# Patient Record
Sex: Male | Born: 1945 | Race: White | Hispanic: No | Marital: Married | Smoking: Former smoker
Health system: Southern US, Community
[De-identification: ages and names within clinical notes are randomized; demographics above are authoritative.]

## PROBLEM LIST (undated history)

## (undated) DIAGNOSIS — N529 Male erectile dysfunction, unspecified: Secondary | ICD-10-CM

## (undated) DIAGNOSIS — M1990 Unspecified osteoarthritis, unspecified site: Secondary | ICD-10-CM

## (undated) DIAGNOSIS — C439 Malignant melanoma of skin, unspecified: Secondary | ICD-10-CM

## (undated) DIAGNOSIS — R011 Cardiac murmur, unspecified: Secondary | ICD-10-CM

## (undated) DIAGNOSIS — I10 Essential (primary) hypertension: Secondary | ICD-10-CM

## (undated) DIAGNOSIS — J449 Chronic obstructive pulmonary disease, unspecified: Secondary | ICD-10-CM

## (undated) DIAGNOSIS — F419 Anxiety disorder, unspecified: Secondary | ICD-10-CM

## (undated) DIAGNOSIS — E119 Type 2 diabetes mellitus without complications: Secondary | ICD-10-CM

## (undated) DIAGNOSIS — E785 Hyperlipidemia, unspecified: Secondary | ICD-10-CM

## (undated) DIAGNOSIS — G4730 Sleep apnea, unspecified: Secondary | ICD-10-CM

## (undated) DIAGNOSIS — I509 Heart failure, unspecified: Secondary | ICD-10-CM

## (undated) HISTORY — DX: Essential (primary) hypertension: I10

## (undated) HISTORY — DX: Type 2 diabetes mellitus without complications: E11.9

## (undated) HISTORY — DX: Sleep apnea, unspecified: G47.30

## (undated) HISTORY — DX: Malignant melanoma of skin, unspecified: C43.9

## (undated) HISTORY — DX: Anxiety disorder, unspecified: F41.9

## (undated) HISTORY — PX: EYE SURGERY: SHX253

## (undated) HISTORY — DX: Chronic obstructive pulmonary disease, unspecified: J44.9

## (undated) HISTORY — DX: Male erectile dysfunction, unspecified: N52.9

## (undated) HISTORY — DX: Unspecified osteoarthritis, unspecified site: M19.90

## (undated) HISTORY — DX: Cardiac murmur, unspecified: R01.1

## (undated) HISTORY — DX: Hyperlipidemia, unspecified: E78.5

---
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
## 1971-05-08 HISTORY — PX: KNEE SURGERY: SHX244

## 2004-02-25 ENCOUNTER — Emergency Department (HOSPITAL_COMMUNITY): Admission: EM | Admit: 2004-02-25 | Discharge: 2004-02-25 | Payer: Self-pay | Admitting: Emergency Medicine

## 2005-07-15 ENCOUNTER — Emergency Department: Payer: Self-pay | Admitting: Emergency Medicine

## 2008-04-02 ENCOUNTER — Ambulatory Visit: Payer: Self-pay | Admitting: Otolaryngology

## 2008-04-08 ENCOUNTER — Ambulatory Visit: Payer: Self-pay | Admitting: Otolaryngology

## 2008-07-15 ENCOUNTER — Ambulatory Visit: Payer: Self-pay | Admitting: Cardiology

## 2009-09-30 ENCOUNTER — Emergency Department: Payer: Self-pay | Admitting: Internal Medicine

## 2010-03-01 ENCOUNTER — Emergency Department: Payer: Self-pay | Admitting: Emergency Medicine

## 2010-09-22 ENCOUNTER — Ambulatory Visit: Payer: Self-pay | Admitting: Specialist

## 2010-10-25 ENCOUNTER — Ambulatory Visit: Payer: Self-pay | Admitting: Specialist

## 2012-04-17 ENCOUNTER — Emergency Department: Payer: Self-pay | Admitting: Internal Medicine

## 2012-04-17 LAB — COMPREHENSIVE METABOLIC PANEL
Albumin: 4.1 g/dL (ref 3.4–5.0)
Alkaline Phosphatase: 113 U/L (ref 50–136)
Anion Gap: 6 — ABNORMAL LOW (ref 7–16)
BUN: 15 mg/dL (ref 7–18)
Bilirubin,Total: 0.5 mg/dL (ref 0.2–1.0)
Calcium, Total: 9 mg/dL (ref 8.5–10.1)
Chloride: 105 mmol/L (ref 98–107)
Co2: 28 mmol/L (ref 21–32)
Creatinine: 0.77 mg/dL (ref 0.60–1.30)
EGFR (African American): >60
EGFR (Non-African Amer.): >60
Glucose: 126 mg/dL — ABNORMAL HIGH (ref 65–99)
Osmolality: 280 (ref 275–301)
Potassium: 3.8 mmol/L (ref 3.5–5.1)
SGOT(AST): 25 U/L (ref 15–37)
SGPT (ALT): 46 U/L (ref 12–78)
Sodium: 139 mmol/L (ref 136–145)
Total Protein: 7.8 g/dL (ref 6.4–8.2)

## 2012-04-17 LAB — URINALYSIS, COMPLETE
Bacteria: NONE SEEN
Bilirubin,UR: NEGATIVE
Glucose,UR: NEGATIVE mg/dL (ref 0–75)
Ketone: NEGATIVE
Leukocyte Esterase: NEGATIVE
Ph: 6 (ref 4.5–8.0)
RBC,UR: 2 /HPF (ref 0–5)

## 2012-04-17 LAB — CBC
HCT: 38.7 % — ABNORMAL LOW (ref 40.0–52.0)
HGB: 13.4 g/dL (ref 13.0–18.0)
MCHC: 34.7 g/dL (ref 32.0–36.0)
MCV: 86 fL (ref 80–100)
Platelet: 205 10*3/uL (ref 150–440)
RDW: 14 % (ref 11.5–14.5)
WBC: 6.6 10*3/uL (ref 3.8–10.6)

## 2014-10-12 ENCOUNTER — Encounter: Payer: Federal, State, Local not specified - PPO | Attending: Family Medicine | Admitting: Dietician

## 2014-10-12 ENCOUNTER — Encounter: Payer: Self-pay | Admitting: Dietician

## 2014-10-12 VITALS — BP 145/84 | Ht 74.0 in | Wt 253.6 lb

## 2014-10-12 DIAGNOSIS — E119 Type 2 diabetes mellitus without complications: Secondary | ICD-10-CM | POA: Diagnosis present

## 2014-10-12 DIAGNOSIS — E1165 Type 2 diabetes mellitus with hyperglycemia: Secondary | ICD-10-CM

## 2014-10-12 NOTE — Progress Notes (Signed)
Diabetes Self-Management Education  Visit Type: First/Initial  Appt. Start Time: 0915 Appt. End Time: 1030  10/12/2014  [name], identified by name and date of birth, is a 69 y.o. male with a diagnosis of Diabetes: Type 2.  Other people present during visit:      ASSESSMENT  Blood pressure 145/84, height 6\' 2"  (1.88 m), weight 253 lb 9.6 oz (115.032 kg). Body mass index is 32.55 kg/(m^2).  Initial Visit Information:  Are you currently following a meal plan?: Yes   Are you taking your medications as prescribed?: No Are you checking your feet?: No   How often do you need to have someone help you when you read instructions, pamphlets, or other written materials from your doctor or pharmacy?: 1 - Never    Psychosocial:     Patient Belief/Attitude about Diabetes: Motivated to manage diabetes Self-care barriers: None Patient Concerns: Nutrition/Meal planning, Glycemic Control, Weight Control Special Needs: None Preferred Learning Style: Auditory, Hands on Learning Readiness: Ready  Complications:   Last HgB A1C per patient/outside source: 9 mg/dL (09-10-14) How often do you check your blood sugar?: 1-2 times/day Fasting Blood glucose range (mg/dL): >200 Postprandial Blood glucose range (mg/dL): >200 Have you had a dilated eye exam in the past 12 months?: Yes Have you had a dental exam in the past 12 months?: Yes  Diet Intake:  Snack (morning):  (eats am and afternoon snacks) Lunch:  (eats fried foods and sweets 4-5x/wk) Snack (evening):  (does not eat a bedtime snack) Beverage(s):  (drinks fruit juices &/or Healthy Balance drinks 5+/day)    Exercise:  Exercise: ADL's  Individualized Plan for Diabetes Self-Management Training:   Learning Objective:  Patient will have a greater understanding of diabetes self-management.  Patient education plan per assessed needs and concerns is to attend individual sessions for      Education Topics Reviewed with Patient Today:  Definition of diabetes, type 1 and 2, and the diagnosis of diabetes, Explored patient's options for treatment of their diabetes Role of diet in the treatment of diabetes and the relationship between the three main macronutrients and blood glucose level (portion sizes, basic carbohydrate counting) Role of exercise on diabetes management, blood pressure control and cardiac health. Reviewed patients medication for diabetes, action, purpose, timing of dose and side effects. Taught/discussed recording of test results and interpretation of SMBG., Identified appropriate SMBG and/or A1C goals., Purpose and frequency of SMBG., Yearly dilated eye exam Discussed and identified patients' treatment of hyperglycemia. Relationship between chronic complications and blood glucose control, Lipid levels, blood glucose control and heart disease, Dental care, Retinopathy and reason for yearly dilated eye exams     Lifestyle issues that need to be addressed for better diabetes care, Helped patient develop diabetes management plan for (enter comment)  PATIENTS GOALS/Plan (Developed by the patient):     Plan:   Patient Instructions   Check blood sugars 2 x day before breakfast and 2 hrs after supper every day  Exercise:  HOLD exercise for now-exercise ONLY when blood sugars <250   Avoid sugar sweetened drinks (soda, tea, coffee, sports drinks, juices) Eat 3 meals day,   1 snacks a day at bedtime Space meals 4-6 hours apart Drink lots of water and sugar free liquids Limit fried foods and sweets/desserts  Bring blood sugar records to the next appointment/class  Get a Journalist, newspaper  with BG's on 10-14-14 Return for appointment/classes on:  Class 1 on 11-15-14   Expected Outcomes:     Education material provided: general meal planning guidelines  If problems or questions, patient to contact team via:  Call [place] with [facility] on 10-14-14  [phone])  Future DSME appointment: 4-6 wks (class 1 on 11-15-14)

## 2014-10-12 NOTE — Patient Instructions (Signed)
  Check blood sugars 2 x day before breakfast and 2 hrs after supper every day  Exercise:  HOLD exercise for now-exercise ONLY when blood sugars <250   Avoid sugar sweetened drinks (soda, tea, coffee, sports drinks, juices) Eat 3 meals day,   1 snacks a day at bedtime Space meals 4-6 hours apart Drink lots of water and sugar free liquids Limit fried foods and sweets/desserts  Bring blood sugar records to the next appointment/class  Get a Journalist, newspaper with BG[facility] on 10-14-14 Return for appointment/classes on:  Class 1 on 11-15-14

## 2014-11-02 ENCOUNTER — Telehealth: Payer: Self-pay | Admitting: Dietician

## 2014-11-15 ENCOUNTER — Encounter: Payer: Self-pay | Admitting: Dietician

## 2014-11-15 ENCOUNTER — Encounter: Payer: Federal, State, Local not specified - PPO | Attending: Family Medicine | Admitting: Dietician

## 2014-11-15 VITALS — Wt 255.6 lb

## 2014-11-15 DIAGNOSIS — E119 Type 2 diabetes mellitus without complications: Secondary | ICD-10-CM | POA: Insufficient documentation

## 2014-11-15 NOTE — Progress Notes (Deleted)
Subjective:     Patient ID: Paul Goodman, male   DOB: [birth year], 69 y.o.   MRN: [id number]  HPI   Review of Systems     Objective:   Physical Exam     Assessment:     ***    Plan:     ***

## 2014-11-15 NOTE — Telephone Encounter (Signed)
Phone call to pt for update on [facility].

## 2014-11-15 NOTE — Progress Notes (Deleted)
Subjective:     Patient ID: Paul Goodman, male   DOB: Mar 09, 1946, 69 y.o.   MRN: [id number]  HPI   Review of Systems     Objective:   Physical Exam     Assessment:     ***    Plan:     ***

## 2014-11-15 NOTE — Progress Notes (Signed)

## 2014-11-16 ENCOUNTER — Encounter: Payer: Self-pay | Admitting: Dietician

## 2014-11-16 NOTE — Progress Notes (Signed)
Faxed Dr. Kary Kos  [facility] on 11-15-14

## 2014-11-22 ENCOUNTER — Encounter: Payer: Federal, State, Local not specified - PPO | Admitting: Dietician

## 2014-11-22 ENCOUNTER — Encounter: Payer: Self-pay | Admitting: Dietician

## 2014-11-22 VITALS — Wt 252.5 lb

## 2014-11-22 DIAGNOSIS — E119 Type 2 diabetes mellitus without complications: Secondary | ICD-10-CM | POA: Diagnosis not present

## 2014-11-22 NOTE — Progress Notes (Signed)

## 2014-11-29 ENCOUNTER — Encounter: Payer: Federal, State, Local not specified - PPO | Admitting: Dietician

## 2014-11-29 VITALS — BP 150/74 | Ht 74.0 in | Wt 254.5 lb

## 2014-11-29 DIAGNOSIS — E119 Type 2 diabetes mellitus without complications: Secondary | ICD-10-CM | POA: Diagnosis not present

## 2014-11-29 DIAGNOSIS — E1165 Type 2 diabetes mellitus with hyperglycemia: Secondary | ICD-10-CM

## 2014-11-29 NOTE — Progress Notes (Signed)

## 2014-11-30 ENCOUNTER — Encounter: Payer: Self-pay | Admitting: Dietician

## 2015-05-30 ENCOUNTER — Encounter: Payer: Self-pay | Admitting: Dietician

## 2015-05-30 NOTE — Progress Notes (Signed)
Mailed 6 month follow up letter to pt. 

## 2015-07-05 ENCOUNTER — Encounter: Payer: Self-pay | Admitting: Dietician

## 2015-07-05 NOTE — Progress Notes (Signed)
Pt did not respond to 6 month follow up letter that was mailed on 05-30-15 

## 2016-01-19 ENCOUNTER — Telehealth: Payer: Self-pay | Admitting: Urology

## 2016-01-19 ENCOUNTER — Ambulatory Visit (INDEPENDENT_AMBULATORY_CARE_PROVIDER_SITE_OTHER): Payer: Federal, State, Local not specified - PPO | Admitting: Urology

## 2016-01-19 ENCOUNTER — Encounter: Payer: Self-pay | Admitting: Urology

## 2016-01-19 VITALS — Ht 74.0 in | Wt 260.3 lb

## 2016-01-19 DIAGNOSIS — N521 Erectile dysfunction due to diseases classified elsewhere: Secondary | ICD-10-CM

## 2016-01-19 DIAGNOSIS — Z125 Encounter for screening for malignant neoplasm of prostate: Secondary | ICD-10-CM

## 2016-01-19 DIAGNOSIS — N138 Other obstructive and reflux uropathy: Secondary | ICD-10-CM

## 2016-01-19 DIAGNOSIS — N401 Enlarged prostate with lower urinary tract symptoms: Secondary | ICD-10-CM | POA: Diagnosis not present

## 2016-01-19 DIAGNOSIS — E1169 Type 2 diabetes mellitus with other specified complication: Secondary | ICD-10-CM

## 2016-01-19 NOTE — Telephone Encounter (Signed)
Would you call a script for Trimix to [place] for this patient?

## 2016-01-19 NOTE — Progress Notes (Signed)
01/19/2016 12:09 PM   Paul Goodman 12-09-1945 [id number]  Referring provider: [name], MD [address] [facility] [name], [name] [address]  Chief Complaint  Patient presents with  . Erectile Dysfunction    referred by Dr. Mosie Epstein    HPI: Patient is a 70 year old Caucasian male who is referred for erectile dysfunction by Dr. Kary Kos.  Erectile dysfunction His SHIM score is 5,  which is severe.   He has been having difficulty with erections for 35 years.   His major complaint is no erections.  His libido is preserved.   His risk factors for ED are age, BPH, DM, HTN, HLD, sleep apnea,  anxiety, alcohol abuse, history of smoking, antidepressants, pain medication and blood pressure medications.   He denies any painful erections or curvatures with his erections.   He has tried Viagra in the past, but he could not tolerate the side effects.  He then tried Cialis, but it was ineffective.  He has upcoming wedding anniversary in one month.        SHIM    Row Name 01/19/16 1145         SHIM: Over the last 6 months:   How do you rate your confidence that you could get and keep an erection? Very Low     When you had erections with sexual stimulation, how often were your erections hard enough for penetration (entering your partner)? Almost Never or Never     During sexual intercourse, how often were you able to maintain your erection after you had penetrated (entered) your partner? Extremely Difficult     During sexual intercourse, how difficult was it to maintain your erection to completion of intercourse? Extremely Difficult     When you attempted sexual intercourse, how often was it satisfactory for you? Extremely Difficult       SHIM Total Score   SHIM 5        Score: 1-7 Severe ED 8-11 Moderate ED 12-16 Mild-Moderate ED 17-21 Mild ED 22-25 No ED   BPH WITH LUTS His IPSS score today is 3, which is mild lower urinary tract symptomatology.  He is  pleased with his quality life due to his urinary symptoms.   His major complaint today is straining to urinate and nocturia. He has had these symptoms for several years.  He denies any dysuria, hematuria or suprapubic pain.  He also denies any recent fevers, chills, nausea or vomiting.   He does not have a family history of PCa.      IPSS    Row Name 01/19/16 1100         International Prostate Symptom Score   How often have you had the sensation of not emptying your bladder? Not at All     How often have you had to urinate less than every two hours? Not at All     How often have you found you stopped and started again several times when you urinated? Not at All     How often have you found it difficult to postpone urination? Not at All     How often have you had a weak urinary stream? Not at All     How often have you had to strain to start urination? Less than half the time     How many times did you typically get up at night to urinate? 1 Time     Total IPSS Score 3  Quality of Life due to urinary symptoms   If you were to spend the rest of your life with your urinary condition just the way it is now how would you feel about that? Pleased        Score:  1-7 Mild 8-19 Moderate 20-35 Severe     PMH: Past Medical History:  Diagnosis Date  . Anxiety   . Arthritis   . COPD (chronic obstructive pulmonary disease) ([place])   . Diabetes ([place])   . ED (erectile dysfunction)   . Heart murmur   . Hyperlipidemia   . Hypertension   . Melanoma ([place])   . Sleep apnea     Surgical History: Past Surgical History:  Procedure Laterality Date  . EYE SURGERY Right    cancer  . HAND SURGERY Left 1966  . KNEE SURGERY Left 1973  . TONSILLECTOMY  1964    Home Medications:    Medication List       Accurate as of 01/19/16 12:09 PM. Always use your most recent med list.          albuterol 108 (90 Base) MCG/ACT inhaler Commonly known as:  PROVENTIL HFA;VENTOLIN HFA INHALE 2  PUFFS EVERY 4 - 6 HOURS AS NEEDED   amLODipine 5 MG tablet Commonly known as:  NORVASC 5 mg daily.   aspirin EC 81 MG tablet Take 81 mg by mouth daily.   BD PEN NEEDLE NANO U/F 32G X 4 MM Misc Generic drug:  Insulin Pen Needle USE AS DIRECTED   Chromium Picolinate 500 MCG Tabs Take 1 tablet by mouth daily.   Cinnamon 500 MG capsule Take by mouth.   fenofibrate 160 MG tablet 160 mg daily.   FREESTYLE LITE test strip Generic drug:  glucose blood USE AS DIRECTED TWICE DAILY   GARCINIA CAMBOGIA-CHROMIUM PO Take 1,000 mg by mouth daily.   glipiZIDE 5 MG 24 hr tablet Commonly known as:  GLUCOTROL XL Take 5 mg by mouth daily. 2 tablets daily   L-Lysine 500 MG Tabs Take 1 tablet by mouth daily.   losartan-hydrochlorothiazide 100-25 MG tablet Commonly known as:  HYZAAR Take 1 tablet by mouth daily.   Magnesium 500 MG Tabs Take 1 tablet by mouth daily.   Milk Thistle 250 MG Caps Take 1 capsule by mouth daily.   niacin 500 MG tablet Take 500 mg by mouth daily.   PARoxetine 20 MG tablet Commonly known as:  PAXIL Take 1 tablet by mouth daily.   PARoxetine 20 MG tablet Commonly known as:  PAXIL Take by mouth.   rOPINIRole 0.5 MG tablet Commonly known as:  REQUIP 1 tablet daily.   Saw Palmetto 450 MG Caps Take 1 capsule by mouth daily.   traMADol 50 MG tablet Commonly known as:  ULTRAM Take 1-2 tablets by mouth. Every 6 hrs as needed for pain   VANADIUM PO Take 500 mcg by mouth daily.   VICTOZA 18 MG/3ML Sopn Generic drug:  Liraglutide Inject into the skin.   vitamin A 8000 UNIT capsule Take 8,000 Units by mouth daily. Takes 2 tablets daily   vitamin B-12 1000 MCG tablet Commonly known as:  CYANOCOBALAMIN Take 1,000 mcg by mouth daily.   Zinc 50 MG Tabs Take 1 tablet by mouth daily.       Allergies: No Known Allergies  Family History: Family History  Problem Relation Age of Onset  . Kidney Stones Father   . Kidney disease Neg Hx   .  Prostate cancer Neg  Hx     Social History:  reports that he has quit smoking. He smoked 0.00 packs per day. He has quit using smokeless tobacco. He reports that he drinks about 1.8 oz of alcohol per week . He reports that he does not use drugs.  ROS: UROLOGY Frequent Urination?: No Hard to postpone urination?: No Burning/pain with urination?: No Get up at night to urinate?: Yes Leakage of urine?: No Urine stream starts and stops?: No Trouble starting stream?: No Do you have to strain to urinate?: No Blood in urine?: No Urinary tract infection?: No Sexually transmitted disease?: No Injury to kidneys or bladder?: No Painful intercourse?: No Weak stream?: No Erection problems?: Yes Penile pain?: No  Gastrointestinal Nausea?: No Vomiting?: No Indigestion/heartburn?: No Diarrhea?: No Constipation?: No  Constitutional Fever: No Night sweats?: No Weight loss?: No Fatigue?: Yes  Skin Skin rash/lesions?: No Itching?: No  Eyes Blurred vision?: No Double vision?: No  Ears/Nose/Throat Sore throat?: No Sinus problems?: Yes  Hematologic/Lymphatic Swollen glands?: No Easy bruising?: No  Cardiovascular Leg swelling?: No Chest pain?: No  Respiratory Cough?: No Shortness of breath?: Yes  Endocrine Excessive thirst?: No  Musculoskeletal Back pain?: Yes Joint pain?: Yes  Neurological Headaches?: No Dizziness?: No  Psychologic Depression?: No Anxiety?: Yes  Physical Exam: Ht 6\' 2"  (1.88 m)   Wt 260 lb 4.8 oz (118.1 kg)   BMI 33.42 kg/m   Constitutional: Well nourished. Alert and oriented, No acute distress. HEENT: [place] AT, moist mucus membranes. Trachea midline, no masses. Cardiovascular: No clubbing, cyanosis, or edema. Respiratory: Normal respiratory effort, no increased work of breathing. GI: Abdomen is soft, non tender, non distended, no abdominal masses. Liver and spleen not palpable.  No hernias appreciated.  Stool sample for occult testing is not  indicated.   GU: No CVA tenderness.  No bladder fullness or masses.  Patient with uncircumcised phallus. Foreskin easily retracted.   Urethral meatus is patent.  No penile discharge. No penile lesions or rashes. Scrotum without lesions, cysts, rashes and/or edema.  Testicles are located scrotally bilaterally. No masses are appreciated in the testicles. Left and right epididymis are normal. Rectal: Patient with  normal sphincter tone. Anus and perineum without scarring or rashes. No rectal masses are appreciated. Prostate is approximately 60 grams, DRE limited to patient's body habitus, no nodules are appreciated. Seminal vesicles are normal. Skin: No rashes, bruises or suspicious lesions. Lymph: No cervical or inguinal adenopathy. Neurologic: Grossly intact, no focal deficits, moving all 4 extremities. Psychiatric: Normal mood and affect.  Laboratory Data: Lab Results  Component Value Date   WBC 6.6 04/17/2012   HGB 13.4 04/17/2012   HCT 38.7 (L) 04/17/2012   MCV 86 04/17/2012   PLT 205 04/17/2012    Lab Results  Component Value Date   CREATININE 0.77 04/17/2012     Lab Results  Component Value Date   AST 25 04/17/2012   Lab Results  Component Value Date   ALT 46 04/17/2012     Assessment & Plan:    1. Erectile dysfunction:  SHIM score is 5.   I explained to the patient that in order to achieve an erection it takes good functioning of the nervous system (parasympathetic, sympathetic, sensory and motor), good blood flow into the erectile tissue of the penis and a desire to have sex.   I stated that conditions like diabetes, hypertension, coronary artery disease, peripheral vascular disease, smoking, alcohol consumption, age and BPH can diminish the ability to have an erection.  We trying a different PDE5 inhibitor, intra-urethral suppositories, intracavernous vasoactive drug injection therapy, vacuum constriction device and penile prosthesis implantation.  We discussed trying a  different PDE5 inhibitor, intra-urethral suppositories, intracavernous vasoactive drug injection therapy, vacuum constriction device and penile prosthesis implantation.  - encouraged patient to keep good control of DM  - encouraged patient to undergo sleep study as he has a history of sleep apnea  - interested in Trimix  - RTC for Trimix injection  - check TSH  2. BPH with LUTS  - IPSS score is 3/1  - Continue conservative management, avoiding bladder irritants and timed voiding's  - Cannot tolerate medication or medication failure, schedule cystoscopy  - RTC in 12 months for IPSS, PSA and exam    3. PSA screening  AUA panel feels that in men age 75 to 66 years, there was sufficient certainty that the benefits of screening could outweigh the harms that a recommendation of shared decision-making in this age group was justified  - we discussed screening, he states that he believes they have always been low  - will check PSA today   Return for RTC for Trimix injection.  These notes generated with voice recognition software. I apologize for typographical errors.  Zara Council, [facility] [address], [place], [place] [address] [phone]

## 2016-01-19 NOTE — Telephone Encounter (Signed)
Script called in to pharmacy  

## 2016-01-20 ENCOUNTER — Telehealth: Payer: Self-pay | Admitting: Family Medicine

## 2016-01-20 LAB — PSA: PROSTATE SPECIFIC AG, SERUM: 2.1 ng/mL (ref 0.0–4.0)

## 2016-01-20 LAB — TSH: TSH: 0.764 u[IU]/mL (ref 0.450–4.500)

## 2016-01-20 NOTE — Telephone Encounter (Signed)
Patient notified and voiced understanding.

## 2016-01-20 NOTE — Telephone Encounter (Signed)
-----   Message from Nori Riis, PA-C sent at 01/20/2016  8:48 AM EDT ----- Please notify the patient that his thyroid function and PSA are normal.

## 2016-02-20 ENCOUNTER — Ambulatory Visit: Payer: Federal, State, Local not specified - PPO | Admitting: Urology

## 2016-02-20 ENCOUNTER — Encounter: Payer: Self-pay | Admitting: Urology

## 2016-02-20 VITALS — BP 194/88 | HR 71 | Ht 74.0 in | Wt 262.6 lb

## 2016-02-20 DIAGNOSIS — R351 Nocturia: Secondary | ICD-10-CM

## 2016-02-20 DIAGNOSIS — E1169 Type 2 diabetes mellitus with other specified complication: Secondary | ICD-10-CM | POA: Diagnosis not present

## 2016-02-20 DIAGNOSIS — N521 Erectile dysfunction due to diseases classified elsewhere: Secondary | ICD-10-CM

## 2016-02-20 NOTE — Progress Notes (Signed)
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  An area near the base of the penis is cleansed with rubbing alcohol.  Careful to avoid the dorsal vein, 6 mcg of Trimix is injected at a 90 degree angle into the left corpus cavernosum near the base of the penis.  Patient experienced a semi firm erection in 15 minutes.    Patient will go home and report results.

## 2016-02-21 ENCOUNTER — Telehealth: Payer: Self-pay | Admitting: Urology

## 2016-02-21 NOTE — Telephone Encounter (Signed)
Patient will need to have another appointment to inject a higher dose of the Trimix.

## 2016-02-21 NOTE — Telephone Encounter (Signed)
Patient said he did not have good results with the trimix injection. He said there was some improvement but not what he was hoping for.  He said he was not what he was 30 years ago.  He would like a call back  [place]

## 2016-02-22 NOTE — Telephone Encounter (Signed)
done

## 2016-02-29 ENCOUNTER — Ambulatory Visit: Payer: Federal, State, Local not specified - PPO | Admitting: Urology

## 2016-02-29 ENCOUNTER — Telehealth: Payer: Self-pay | Admitting: Urology

## 2016-02-29 ENCOUNTER — Encounter: Payer: Self-pay | Admitting: Urology

## 2016-02-29 VITALS — BP 168/83 | HR 83 | Ht 74.0 in | Wt 259.9 lb

## 2016-02-29 DIAGNOSIS — I10 Essential (primary) hypertension: Secondary | ICD-10-CM | POA: Insufficient documentation

## 2016-02-29 DIAGNOSIS — N529 Male erectile dysfunction, unspecified: Secondary | ICD-10-CM | POA: Diagnosis not present

## 2016-02-29 DIAGNOSIS — M1990 Unspecified osteoarthritis, unspecified site: Secondary | ICD-10-CM | POA: Insufficient documentation

## 2016-02-29 DIAGNOSIS — J449 Chronic obstructive pulmonary disease, unspecified: Secondary | ICD-10-CM | POA: Insufficient documentation

## 2016-02-29 DIAGNOSIS — E785 Hyperlipidemia, unspecified: Secondary | ICD-10-CM | POA: Insufficient documentation

## 2016-02-29 DIAGNOSIS — E119 Type 2 diabetes mellitus without complications: Secondary | ICD-10-CM | POA: Insufficient documentation

## 2016-02-29 NOTE — Telephone Encounter (Signed)
Would you call in a new dose of Timix for [facility]  (30 mg PAPA, 1 mL phen, 50 mg prostaglandin) for the patient?  This is an increase in his dose.

## 2016-02-29 NOTE — Progress Notes (Signed)
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  An area near the base of the penis is cleansed with rubbing alcohol.  Careful to avoid the dorsal vein, 8 mcg of Trimix is injected at a 90 degree angle into the left corpus cavernosum near the base of the penis.  Patient experienced a semi firm erection in 15 minutes.    Patient will go home and report results.    He will continue to inject the remaining syringes of Trimix (3 syringes) every other day.  We will increase the ingredients of the Trimix for the patient and he will present for an injection for the increased Trimix.

## 2016-03-02 NOTE — Telephone Encounter (Signed)
Medication sent to pharmacy  

## 2016-03-22 ENCOUNTER — Telehealth: Payer: Self-pay | Admitting: Urology

## 2016-03-22 NOTE — Telephone Encounter (Signed)
Patient called regarding his Trimix prescription.  He would like to know how much of the needle he should inject.  He cant remember the instructions.  Please call his number and it is okay to leave on his answering machine.

## 2016-03-22 NOTE — Telephone Encounter (Signed)
Patient needs to inject 8 micrograms and plunge the needle until the hub.

## 2016-03-22 NOTE — Telephone Encounter (Signed)
Pt called back and I read Shannon's message to him.  Pt voiced understanding.

## 2016-04-23 DIAGNOSIS — E1165 Type 2 diabetes mellitus with hyperglycemia: Secondary | ICD-10-CM | POA: Insufficient documentation

## 2017-01-09 NOTE — Progress Notes (Signed)
01/11/2017 9:19 AM   Paul Goodman July 19, 1945 [id number]  Referring provider: [name], MD [address] [facility] [place], [place] [address]  Chief Complaint  Patient presents with  . Nephrolithiasis    patient having pain thinks it may be a stone    HPI: Patient is a 71 yo WM with ED and BPH with LU TS who presents today with a new complaint of possible kidney stones.  He states that he started to have right flank pain that radiated to the right groin area.  Pain has been at an 11/10.  He states moving seems to make the pain worse.  He states taking olive oil helps the pain.  He has not had any gross hematuria, dysuria or suprapubic pain. He has not had any fevers, chills, nausea or vomiting.  He has not passed any fragments.  He was told a long time ago that he had several stones in each kidneys after he had an x-ray.  He states he does not remember if he ever had any surgery or procedures for his stones.   BPH WITH LUTS  (prostate and/or bladder) His IPSS score today is 16, which is moderate lower urinary tract symptomatology.  He is terrible with his quality life due to his urinary symptoms.     His major complaint today is nocturia x 3.  He has had these symptoms for several years.  He denies any dysuria, hematuria or suprapubic pain.   He also denies any recent fevers, chills, nausea or vomiting.  He does not have a family history of PCa.      IPSS    Row Name 01/11/17 0800         International Prostate Symptom Score   How often have you had the sensation of not emptying your bladder? Less than half the time     How often have you had to urinate less than every two hours? More than half the time     How often have you found you stopped and started again several times when you urinated? Less than half the time     How often have you found it difficult to postpone urination? About half the time     How often have you had a weak urinary stream?  Less than half the time     How often have you had to strain to start urination? Not at All     How many times did you typically get up at night to urinate? 3 Times     Total IPSS Score 16       Quality of Life due to urinary symptoms   If you were to spend the rest of your life with your urinary condition just the way it is now how would you feel about that? Terrible        Score:  1-7 Mild 8-19 Moderate 20-35 Severe   Erectile dysfunction His SHIM score is 5, which is severe ED.   His previous SHIM score was 5.  He has been having difficulty with erections for several.   His major complaint is no erections.  His libido is preserved.   His risk factors for ED are age, BPH, DM, COPD, HTN and sleep apnea.  He denies any painful erections or curvatures with his erections.   He is still having/no longer having spontaneous erections.  He has tried Trimix in the past.  He states he has used his friends  Trimix from another pharmacy and it worked better.   He is seeing Dr. Kary Kos for testosterone injections.       [place] Name 01/11/17 0849         SHIM: Over the last 6 months:   How do you rate your confidence that you could get and keep an erection? Very Low     When you had erections with sexual stimulation, how often were your erections hard enough for penetration (entering your partner)? Almost Never or Never     During sexual intercourse, how often were you able to maintain your erection after you had penetrated (entered) your partner? Almost Never or Never     During sexual intercourse, how difficult was it to maintain your erection to completion of intercourse? Extremely Difficult     When you attempted sexual intercourse, how often was it satisfactory for you? Almost Never or Never       SHIM Total Score   SHIM 5        Score: 1-7 Severe ED 8-11 Moderate ED 12-16 Mild-Moderate ED 17-21 Mild ED 22-25 No ED   PMH: Past Medical History:  Diagnosis Date  . Anxiety     . Arthritis   . COPD (chronic obstructive pulmonary disease) ([place])   . Diabetes ([place])   . ED (erectile dysfunction)   . Heart murmur   . Hyperlipidemia   . Hypertension   . Melanoma ([place])   . Sleep apnea     Surgical History: Past Surgical History:  Procedure Laterality Date  . EYE SURGERY Right    cancer  . HAND SURGERY Left 1966  . KNEE SURGERY Left 1973  . TONSILLECTOMY  1964    Home Medications:  Allergies as of 01/11/2017   No Known Allergies     Medication List       Accurate as of 01/11/17  9:19 AM. Always use your most recent med list.          albuterol 108 (90 Base) MCG/ACT inhaler Commonly known as:  PROVENTIL HFA;VENTOLIN HFA INHALE 2 PUFFS EVERY 4 - 6 HOURS AS NEEDED   amLODipine 5 MG tablet Commonly known as:  NORVASC 5 mg daily.   aspirin EC 81 MG tablet Take 81 mg by mouth daily.   BD PEN NEEDLE NANO U/F 32G X 4 MM Misc Generic drug:  Insulin Pen Needle USE AS DIRECTED   Chromium Picolinate 500 MCG Tabs Take 1 tablet by mouth daily.   Cinnamon 500 MG capsule Take by mouth.   fenofibrate 160 MG tablet 160 mg daily.   FREESTYLE LITE test strip Generic drug:  glucose blood USE AS DIRECTED TWICE DAILY   GARCINIA CAMBOGIA-CHROMIUM PO Take 1,000 mg by mouth daily.   glipiZIDE 5 MG 24 hr tablet Commonly known as:  GLUCOTROL XL Take 5 mg by mouth daily. 2 tablets daily   L-Lysine 500 MG Tabs Take 1 tablet by mouth daily.   losartan-hydrochlorothiazide 100-25 MG tablet Commonly known as:  HYZAAR Take 1 tablet by mouth daily.   Magnesium 500 MG Tabs Take 1 tablet by mouth daily.   Milk Thistle 250 MG Caps Take 1 capsule by mouth daily.   niacin 500 MG tablet Take 500 mg by mouth daily.   PARoxetine 20 MG tablet Commonly known as:  PAXIL Take 1 tablet by mouth daily.   PARoxetine 20 MG tablet Commonly known as:  PAXIL Take by mouth.   rOPINIRole 0.5 MG tablet  Commonly known as:  REQUIP 1 tablet daily.   Saw Palmetto  450 MG Caps Take 1 capsule by mouth daily.   tiotropium 18 MCG inhalation capsule Commonly known as:  [place] into inhaler and inhale.   traMADol 50 MG tablet Commonly known as:  ULTRAM Take 1-2 tablets by mouth. Every 6 hrs as needed for pain   VANADIUM PO Take 500 mcg by mouth daily.   VICTOZA 18 MG/3ML Sopn Generic drug:  liraglutide Inject into the skin.   vitamin A 8000 UNIT capsule Take 8,000 Units by mouth daily. Takes 2 tablets daily   vitamin B-12 1000 MCG tablet Commonly known as:  CYANOCOBALAMIN Take 1,000 mcg by mouth daily.   Zinc 50 MG Tabs Take 1 tablet by mouth daily.            Discharge Care Instructions        Start     Ordered   01/11/17 0000  PSA     01/11/17 0850   01/11/17 0000  CT RENAL STONE STUDY    Question Answer Comment  Preferred imaging location? [facility]   Radiology Contrast Protocol - do NOT remove file path [email or web address]      01/11/17 0919      Allergies: No Known Allergies  Family History: Family History  Problem Relation Age of Onset  . Kidney Stones Father   . Kidney disease Neg Hx   . Prostate cancer Neg Hx   . Kidney cancer Neg Hx   . Bladder Cancer Neg Hx     Social History:  reports that he has quit smoking. He smoked 0.00 packs per day. He has quit using smokeless tobacco. He reports that he drinks about 1.8 oz of alcohol per week . He reports that he does not use drugs.  ROS: UROLOGY Frequent Urination?: No Hard to postpone urination?: No Burning/pain with urination?: No Get up at night to urinate?: Yes Leakage of urine?: No Urine stream starts and stops?: No Trouble starting stream?: No Do you have to strain to urinate?: No Blood in urine?: No Urinary tract infection?: No Sexually transmitted disease?: No Injury to kidneys or bladder?: No Painful intercourse?: No Weak stream?: No Erection problems?: No Penile pain?: No  Gastrointestinal Nausea?:  No Vomiting?: No Indigestion/heartburn?: No Diarrhea?: No Constipation?: No  Constitutional Fever: No Night sweats?: No Weight loss?: No Fatigue?: No  Skin Skin rash/lesions?: No Itching?: No  Eyes Blurred vision?: No Double vision?: No  Ears/Nose/Throat Sore throat?: No Sinus problems?: No  Hematologic/Lymphatic Swollen glands?: No Easy bruising?: No  Cardiovascular Leg swelling?: No Chest pain?: No  Respiratory Cough?: No Shortness of breath?: Yes  Endocrine Excessive thirst?: No  Musculoskeletal Back pain?: No Joint pain?: No  Neurological Headaches?: No Dizziness?: No  Psychologic Depression?: No Anxiety?: No  Physical Exam: BP (!) 181/79   Pulse 61   Ht 6' 2.25" (1.886 m)   Wt 250 lb (113.4 kg)   BMI 31.88 kg/m   Constitutional: Well nourished. Alert and oriented, No acute distress. HEENT: [place] AT, moist mucus membranes. Trachea midline, no masses. Cardiovascular: No clubbing, cyanosis, or edema. Respiratory: Normal respiratory effort, no increased work of breathing. GI: Abdomen is soft, non tender, non distended, no abdominal masses. Liver and spleen not palpable.  No hernias appreciated.  Stool sample for occult testing is not indicated.   GU: No CVA tenderness.  No bladder fullness or masses.  Patient with uncircumcised phallus.   Foreskin easily retracted.  Urethral  meatus is patent.  No penile discharge. No penile lesions or rashes. Scrotum without lesions, cysts, rashes and/or edema.  Testicles are located scrotally bilaterally. No masses are appreciated in the testicles. Left and right epididymis are normal. Rectal: Patient with  normal sphincter tone. Anus and perineum without scarring or rashes. No rectal masses are appreciated. Prostate is approximately 50  grams, no nodules are appreciated. Seminal vesicles are normal. Skin: No rashes, bruises or suspicious lesions. Lymph: No cervical or inguinal adenopathy. Neurologic: Grossly  intact, no focal deficits, moving all 4 extremities. Psychiatric: Normal mood and affect.  Laboratory Data: PSA History  2.1 ng/mL on 01/19/2016   Lab Results  Component Value Date   TSH 0.764 01/19/2016    Urinalysis Unremarkable.  See EPIC.   I have reviewed the labs.    Assessment & Plan:    1. Renal colic  - patient reports a history of nephrolithiasis  - will obtain a CT Renal Stone study  - RTC for report  - Advised to contact our office or seek treatment in the ED if becomes febrile or pain/ vomiting are difficult control in order to arrange for emergent/urgent intervention  2. BPH with LUTS  - IPSS score is 16/6, it is worsening  - Continue conservative management, avoiding bladder irritants and timed voiding's  - most bothersome symptoms is/are nocturia  - RTC in 12 months for IPSS, PSA, PVR and exam   3. Erectile dysfunction  - SHIM score is 5  - didn't find [facility] Trimix effective, but he found his friend's more effective - he does not know where his friend received the medication or what the medication was  - RTC in 12 months for repeat SHIM score and exam     Return for CT Renal Stone Study.  These notes generated with voice recognition software. I apologize for typographical errors.  Zara Council, [facility] [address], [place], [place] [address] [phone]

## 2017-01-10 ENCOUNTER — Other Ambulatory Visit: Payer: Self-pay | Admitting: *Deleted

## 2017-01-10 DIAGNOSIS — N200 Calculus of kidney: Secondary | ICD-10-CM

## 2017-01-11 ENCOUNTER — Ambulatory Visit (INDEPENDENT_AMBULATORY_CARE_PROVIDER_SITE_OTHER): Payer: Federal, State, Local not specified - PPO | Admitting: Urology

## 2017-01-11 ENCOUNTER — Other Ambulatory Visit
Admission: RE | Admit: 2017-01-11 | Discharge: 2017-01-11 | Disposition: A | Discharge status: Home / Self Care | Payer: Federal, State, Local not specified - PPO | Source: Ambulatory Visit | Attending: Urology | Admitting: Urology

## 2017-01-11 ENCOUNTER — Encounter: Payer: Self-pay | Admitting: Urology

## 2017-01-11 VITALS — BP 181/79 | HR 61 | Ht 74.25 in | Wt 250.0 lb

## 2017-01-11 DIAGNOSIS — N138 Other obstructive and reflux uropathy: Secondary | ICD-10-CM

## 2017-01-11 DIAGNOSIS — N401 Enlarged prostate with lower urinary tract symptoms: Secondary | ICD-10-CM

## 2017-01-11 DIAGNOSIS — N529 Male erectile dysfunction, unspecified: Secondary | ICD-10-CM

## 2017-01-11 DIAGNOSIS — R109 Unspecified abdominal pain: Secondary | ICD-10-CM

## 2017-01-11 DIAGNOSIS — N200 Calculus of kidney: Secondary | ICD-10-CM | POA: Insufficient documentation

## 2017-01-11 LAB — CBC WITH DIFFERENTIAL/PLATELET
Basophils Absolute: 0 10*3/uL (ref 0–0.1)
Basophils Relative: 1 %
EOS PCT: 2 %
Eosinophils Absolute: 0.2 10*3/uL (ref 0–0.7)
HEMATOCRIT: 40.9 % (ref 40.0–52.0)
Hemoglobin: 14.3 g/dL (ref 13.0–18.0)
LYMPHS ABS: 2.2 10*3/uL (ref 1.0–3.6)
LYMPHS PCT: 33 %
MCH: 30.3 pg (ref 26.0–34.0)
MCHC: 34.8 g/dL (ref 32.0–36.0)
MCV: 87 fL (ref 80.0–100.0)
MONO ABS: 0.5 10*3/uL (ref 0.2–1.0)
Monocytes Relative: 8 %
NEUTROS ABS: 3.6 10*3/uL (ref 1.4–6.5)
Neutrophils Relative %: 56 %
PLATELETS: 172 10*3/uL (ref 150–440)
RBC: 4.7 MIL/uL (ref 4.40–5.90)
RDW: 13.8 % (ref 11.5–14.5)
WBC: 6.5 10*3/uL (ref 3.8–10.6)

## 2017-01-11 LAB — URINALYSIS, COMPLETE (UACMP) WITH MICROSCOPIC
BACTERIA UA: NONE SEEN
Bilirubin Urine: NEGATIVE
GLUCOSE, UA: 500 mg/dL — AB
Hgb urine dipstick: NEGATIVE
KETONES UR: NEGATIVE mg/dL
Leukocytes, UA: NEGATIVE
Nitrite: NEGATIVE
PROTEIN: NEGATIVE mg/dL
Specific Gravity, Urine: >1.03 — ABNORMAL HIGH (ref 1.005–1.030)
Squamous Epithelial / LPF: NONE SEEN
pH: 5.5 (ref 5.0–8.0)

## 2017-01-11 LAB — BASIC METABOLIC PANEL
Anion gap: 7 (ref 5–15)
BUN: 15 mg/dL (ref 6–20)
CALCIUM: 9 mg/dL (ref 8.9–10.3)
CO2: 28 mmol/L (ref 22–32)
Chloride: 100 mmol/L — ABNORMAL LOW (ref 101–111)
Creatinine, Ser: 0.74 mg/dL (ref 0.61–1.24)
Glucose, Bld: 230 mg/dL — ABNORMAL HIGH (ref 65–99)
POTASSIUM: 4.4 mmol/L (ref 3.5–5.1)
SODIUM: 135 mmol/L (ref 135–145)

## 2017-01-11 LAB — PSA: Prostatic Specific Antigen: 0.88 ng/mL (ref 0.00–4.00)

## 2017-01-12 LAB — URINE CULTURE: Culture: NO GROWTH

## 2017-01-14 DIAGNOSIS — F419 Anxiety disorder, unspecified: Secondary | ICD-10-CM | POA: Insufficient documentation

## 2017-01-14 DIAGNOSIS — N138 Other obstructive and reflux uropathy: Secondary | ICD-10-CM | POA: Insufficient documentation

## 2017-01-14 DIAGNOSIS — N529 Male erectile dysfunction, unspecified: Secondary | ICD-10-CM | POA: Insufficient documentation

## 2017-01-14 DIAGNOSIS — G4733 Obstructive sleep apnea (adult) (pediatric): Secondary | ICD-10-CM | POA: Insufficient documentation

## 2017-01-14 DIAGNOSIS — G2581 Restless legs syndrome: Secondary | ICD-10-CM | POA: Insufficient documentation

## 2017-01-25 ENCOUNTER — Ambulatory Visit
Admission: RE | Admit: 2017-01-25 | Discharge: 2017-01-25 | Disposition: A | Discharge status: Home / Self Care | Payer: Federal, State, Local not specified - PPO | Source: Ambulatory Visit | Attending: Urology | Admitting: Urology

## 2017-01-25 DIAGNOSIS — K760 Fatty (change of) liver, not elsewhere classified: Secondary | ICD-10-CM | POA: Diagnosis not present

## 2017-01-25 DIAGNOSIS — R109 Unspecified abdominal pain: Secondary | ICD-10-CM | POA: Insufficient documentation

## 2017-01-25 DIAGNOSIS — I700 Atherosclerosis of aorta: Secondary | ICD-10-CM | POA: Insufficient documentation

## 2017-01-25 DIAGNOSIS — K5730 Diverticulosis of large intestine without perforation or abscess without bleeding: Secondary | ICD-10-CM | POA: Insufficient documentation

## 2017-01-31 NOTE — Progress Notes (Signed)
02/01/2017 8:38 AM   Paul Goodman 18-May-1945 [id number]  Referring provider: [name], MD [address] [facility] [place], [place] [address]  Chief Complaint  Patient presents with  . Results    CT    HPI: Patient is a 71 yo WM with ED and BPH with LU TS who presents today to discuss his CT Renal stone study.    When patient presented 2 weeks ago, he was complaining of right flank pain that radiated to the right groin area.  Pain has been at an 11/10.  He states moving seems to make the pain worse.  He states taking olive oil helps the pain.  He has not had any gross hematuria, dysuria or suprapubic pain. He has not had any fevers, chills, nausea or vomiting.  He has not passed any fragments.  He was told a long time ago that he had several stones in each kidneys after he had an x-ray.  He states he does not remember if he ever had any surgery or procedures for his stones.   CT Renal stone study performed on 01/25/2017 noted No evidence of urolithiasis, hydronephrosis, or other acute findings.  Colonic diverticulosis, without radiographic evidence of diverticulitis.  Mild hepatic steatosis.  Aortic atherosclerosis  Today, he complains of frequent urination, getting up at night to urinate and intermittency.  He is not having dysuria, gross hematuria or suprapubic pain.  He is not having fevers, chills, nausea or vomiting.  He is still complaining of right flank pain, but it is only twinges at this time.    PMH: Past Medical History:  Diagnosis Date  . Anxiety   . Arthritis   . COPD (chronic obstructive pulmonary disease) ([place])   . Diabetes ([place])   . ED (erectile dysfunction)   . Heart murmur   . Hyperlipidemia   . Hypertension   . Melanoma ([place])   . Sleep apnea     Surgical History: Past Surgical History:  Procedure Laterality Date  . EYE SURGERY Right    cancer  . HAND SURGERY Left 1966  . KNEE SURGERY Left 1973  . TONSILLECTOMY  1964    Home  Medications:  Allergies as of 02/01/2017   No Known Allergies     Medication List       Accurate as of 02/01/17  8:38 AM. Always use your most recent med list.          albuterol 108 (90 Base) MCG/ACT inhaler Commonly known as:  PROVENTIL HFA;VENTOLIN HFA INHALE 2 PUFFS EVERY 4 - 6 HOURS AS NEEDED   amLODipine 5 MG tablet Commonly known as:  NORVASC 5 mg daily.   aspirin EC 81 MG tablet Take 81 mg by mouth daily.   BD PEN NEEDLE NANO U/F 32G X 4 MM Misc Generic drug:  Insulin Pen Needle USE AS DIRECTED   Chromium Picolinate 500 MCG Tabs Take 1 tablet by mouth daily.   Cinnamon 500 MG capsule Take by mouth.   fenofibrate 160 MG tablet 160 mg daily.   FREESTYLE LITE test strip Generic drug:  glucose blood USE AS DIRECTED TWICE DAILY   GARCINIA CAMBOGIA-CHROMIUM PO Take 1,000 mg by mouth daily.   glipiZIDE 5 MG 24 hr tablet Commonly known as:  GLUCOTROL XL Take 5 mg by mouth daily. 2 tablets daily   L-Lysine 500 MG Tabs Take 1 tablet by mouth daily.   losartan-hydrochlorothiazide 100-25 MG tablet Commonly known as:  HYZAAR Take 1  tablet by mouth daily.   Magnesium 500 MG Tabs Take 1 tablet by mouth daily.   Milk Thistle 250 MG Caps Take 1 capsule by mouth daily.   niacin 500 MG tablet Take 500 mg by mouth daily.   PARoxetine 20 MG tablet Commonly known as:  PAXIL Take 1 tablet by mouth daily.   PARoxetine 20 MG tablet Commonly known as:  PAXIL Take by mouth.   rOPINIRole 0.5 MG tablet Commonly known as:  REQUIP 1 tablet daily.   Saw Palmetto 450 MG Caps Take 1 capsule by mouth daily.   tiotropium 18 MCG inhalation capsule Commonly known as:  [place] into inhaler and inhale.   traMADol 50 MG tablet Commonly known as:  ULTRAM Take 1-2 tablets by mouth. Every 6 hrs as needed for pain   VANADIUM PO Take 500 mcg by mouth daily.   VICTOZA 18 MG/3ML Sopn Generic drug:  liraglutide Inject into the skin.   vitamin A 8000 UNIT  capsule Take 8,000 Units by mouth daily. Takes 2 tablets daily   vitamin B-12 1000 MCG tablet Commonly known as:  CYANOCOBALAMIN Take 1,000 mcg by mouth daily.   Zinc 50 MG Tabs Take 1 tablet by mouth daily.       Allergies: No Known Allergies  Family History: Family History  Problem Relation Age of Onset  . Kidney Stones Father   . Kidney disease Neg Hx   . Prostate cancer Neg Hx   . Kidney cancer Neg Hx   . Bladder Cancer Neg Hx     Social History:  reports that he has quit smoking. He smoked 0.00 packs per day. He has quit using smokeless tobacco. He reports that he drinks about 1.8 oz of alcohol per week . He reports that he does not use drugs.  ROS: UROLOGY Frequent Urination?: Yes Hard to postpone urination?: No Burning/pain with urination?: No Get up at night to urinate?: Yes Leakage of urine?: No Urine stream starts and stops?: Yes Trouble starting stream?: No Do you have to strain to urinate?: No Blood in urine?: No Urinary tract infection?: No Sexually transmitted disease?: No Injury to kidneys or bladder?: No Painful intercourse?: No Weak stream?: No Erection problems?: Yes Penile pain?: No  Gastrointestinal Nausea?: No Vomiting?: No Indigestion/heartburn?: No Diarrhea?: No Constipation?: No  Constitutional Fever: No Night sweats?: No Weight loss?: No Fatigue?: No  Skin Skin rash/lesions?: No Itching?: No  Eyes Blurred vision?: No Double vision?: No  Ears/Nose/Throat Sore throat?: No Sinus problems?: No  Hematologic/Lymphatic Swollen glands?: No Easy bruising?: No  Cardiovascular Leg swelling?: No Chest pain?: No  Respiratory Cough?: No Shortness of breath?: Yes  Endocrine Excessive thirst?: No  Musculoskeletal Back pain?: No Joint pain?: No  Neurological Headaches?: No Dizziness?: No  Psychologic Depression?: No Anxiety?: No  Physical Exam: BP (!) 164/87   Pulse 65   Ht 6' 2.25" (1.886 m)   Wt 252 lb 8  oz (114.5 kg)   BMI 32.20 kg/m   Constitutional: Well nourished. Alert and oriented, No acute distress. HEENT: [place] AT, moist mucus membranes. Trachea midline, no masses. Cardiovascular: No clubbing, cyanosis, or edema. Respiratory: Normal respiratory effort, no increased work of breathing. Skin: No rashes, bruises or suspicious lesions. Lymph: No cervical or inguinal adenopathy. Neurologic: Grossly intact, no focal deficits, moving all 4 extremities. Psychiatric: Normal mood and affect.  Laboratory Data: PSA History  2.1 ng/mL on 01/19/2016   0.88 ng/mL on 01/11/2017  Lab Results  Component Value Date  TSH 0.764 01/19/2016    I have reviewed the labs.    Assessment & Plan:    1. Right flank pain  - CT Renal stone study did not identify any nephrolithiasis   - patient will follow up with his PCP, Dr. Kary Kos for further evaluation as he is still having flank pain    Return for to see PCP for further evaluation of flank pain.  These notes generated with voice recognition software. I apologize for typographical errors.  Zara Council, [facility] [address] [place], [place] [address] [phone]

## 2017-02-01 ENCOUNTER — Encounter: Payer: Self-pay | Admitting: Urology

## 2017-02-01 ENCOUNTER — Ambulatory Visit (INDEPENDENT_AMBULATORY_CARE_PROVIDER_SITE_OTHER): Payer: Federal, State, Local not specified - PPO | Admitting: Urology

## 2017-02-01 VITALS — BP 164/87 | HR 65 | Ht 74.25 in | Wt 252.5 lb

## 2017-02-01 DIAGNOSIS — R109 Unspecified abdominal pain: Secondary | ICD-10-CM | POA: Diagnosis not present

## 2017-02-26 ENCOUNTER — Emergency Department: Payer: Federal, State, Local not specified - PPO

## 2017-02-26 ENCOUNTER — Encounter: Payer: Self-pay | Admitting: Emergency Medicine

## 2017-02-26 ENCOUNTER — Emergency Department
Admission: EM | Admit: 2017-02-26 | Discharge: 2017-02-26 | Disposition: A | Discharge status: Home / Self Care | Payer: Federal, State, Local not specified - PPO | Attending: Emergency Medicine | Admitting: Emergency Medicine

## 2017-02-26 DIAGNOSIS — Z87891 Personal history of nicotine dependence: Secondary | ICD-10-CM | POA: Insufficient documentation

## 2017-02-26 DIAGNOSIS — J449 Chronic obstructive pulmonary disease, unspecified: Secondary | ICD-10-CM | POA: Insufficient documentation

## 2017-02-26 DIAGNOSIS — R0789 Other chest pain: Secondary | ICD-10-CM | POA: Diagnosis present

## 2017-02-26 DIAGNOSIS — I10 Essential (primary) hypertension: Secondary | ICD-10-CM

## 2017-02-26 DIAGNOSIS — Z7982 Long term (current) use of aspirin: Secondary | ICD-10-CM | POA: Diagnosis not present

## 2017-02-26 DIAGNOSIS — R51 Headache: Secondary | ICD-10-CM | POA: Diagnosis not present

## 2017-02-26 DIAGNOSIS — Z794 Long term (current) use of insulin: Secondary | ICD-10-CM | POA: Insufficient documentation

## 2017-02-26 DIAGNOSIS — Z79899 Other long term (current) drug therapy: Secondary | ICD-10-CM | POA: Diagnosis not present

## 2017-02-26 DIAGNOSIS — E119 Type 2 diabetes mellitus without complications: Secondary | ICD-10-CM | POA: Diagnosis not present

## 2017-02-26 DIAGNOSIS — R519 Headache, unspecified: Secondary | ICD-10-CM

## 2017-02-26 LAB — CBC
HEMATOCRIT: 42.2 % (ref 40.0–52.0)
Hemoglobin: 14.7 g/dL (ref 13.0–18.0)
MCH: 30.1 pg (ref 26.0–34.0)
MCHC: 34.7 g/dL (ref 32.0–36.0)
MCV: 86.6 fL (ref 80.0–100.0)
Platelets: 180 10*3/uL (ref 150–440)
RBC: 4.88 MIL/uL (ref 4.40–5.90)
RDW: 13.1 % (ref 11.5–14.5)
WBC: 6 10*3/uL (ref 3.8–10.6)

## 2017-02-26 LAB — BASIC METABOLIC PANEL
Anion gap: 12 (ref 5–15)
BUN: 15 mg/dL (ref 6–20)
CHLORIDE: 98 mmol/L — AB (ref 101–111)
CO2: 23 mmol/L (ref 22–32)
Calcium: 9 mg/dL (ref 8.9–10.3)
Creatinine, Ser: 0.73 mg/dL (ref 0.61–1.24)
GFR calc Af Amer: >60 mL/min (ref >60)
GLUCOSE: 348 mg/dL — AB (ref 65–99)
POTASSIUM: 3.9 mmol/L (ref 3.5–5.1)
Sodium: 133 mmol/L — ABNORMAL LOW (ref 135–145)

## 2017-02-26 LAB — TROPONIN I: Troponin I: <0.03 ng/mL (ref <0.03)

## 2017-02-26 MED ORDER — BUTALBITAL-APAP-CAFFEINE 50-325-40 MG PO TABS: 1.0000 | ORAL_TABLET | Freq: Four times a day (QID) | ORAL | 0 refills | Status: AC | PRN

## 2017-02-26 NOTE — Discharge Instructions (Signed)
As we discussed, your workup today was reassuring.  Though we do not know exactly what is causing your symptoms, it appears that you have no emergent medical condition at this time and that you are safe to go home and follow up as recommended in this paperwork. ° °Please return immediately to the Emergency Department if you develop any new or worsening symptoms that concern you. ° °

## 2017-02-26 NOTE — ED Notes (Signed)
Pt discharged home after verbalizing understanding of discharge instructions; nad noted. 

## 2017-02-26 NOTE — ED Provider Notes (Signed)
[facility] Emergency Department Provider Note  ____________________________________________   First MD Initiated Contact with Patient 02/26/17 1555     (approximate)  I have reviewed the triage vital signs and the nursing notes.   HISTORY  Chief Complaint Chest Pain and Headache    HPI Paul Goodman is a 71 y.o. male Who presents with 2 separate complaints, intermittent left-sided chest pain 7 months, and a global throbbing headache that is occurred intermittently for about 2 months.  headache: This is the patient's primary complaint.  He states that nothing makes it better and nothing makes it worse.  It comes and goes on its own.  It feels like it starts in the back of his head and then radiates forward behind his eyes and is a dull and throbbing pain.  Sometimes he feels some photosensitivity.  He does not have any visual changes.  time he thinks he had some numbness in his left hand associated with the headache but he is not certain.  Sometimes Excedrin will help a little bit but because it is been going on for 2 months he was concerned that he may have cancer.  He does have a history of migraines but states that this feels completely different. he has had no weakness in his extremities and no difficulty with ambulation.  Headache is mostly gone at this time although he still has some throbbing around his temples..  Chest pain: The patient is not particularly concerned about his chest pain today that mentioned in passing in triage.  He states that from time to time over the last 7 months he has had a sharp "twinge" in the left side of his chest that does not seem to be related to exertion, eating, or anything else in particular.  It goes away on its own and is very brief. it is not accompanied with shortness of breath, nausea, vomiting, nor abdominal pain.    Past Medical History:  Diagnosis Date  . Anxiety   . Arthritis   . COPD (chronic obstructive  pulmonary disease) ([place])   . Diabetes ([place])   . ED (erectile dysfunction)   . Heart murmur   . Hyperlipidemia   . Hypertension   . Melanoma ([place])   . Sleep apnea     Patient Active Problem List   Diagnosis Date Noted  . Arthritis 02/29/2016  . COPD (chronic obstructive pulmonary disease) ([place]) 02/29/2016  . Diabetes mellitus type 2, uncomplicated ([place]) 26/94/8546  . Hyperlipidemia, unspecified 02/29/2016  . Hypertension 02/29/2016    Past Surgical History:  Procedure Laterality Date  . EYE SURGERY Right    cancer  . HAND SURGERY Left 1966  . KNEE SURGERY Left 1973  . TONSILLECTOMY  1964    Prior to Admission medications   Medication Sig Start Date End Date Taking? Authorizing Provider  albuterol (PROVENTIL HFA;VENTOLIN HFA) 108 (90 BASE) MCG/ACT inhaler INHALE 2 PUFFS EVERY 4 - 6 HOURS AS NEEDED 06/07/14   [provider]  amLODipine (NORVASC) 5 MG tablet 5 mg daily. 08/23/14   [provider]  aspirin EC 81 MG tablet Take 81 mg by mouth daily.    [provider]  butalbital-acetaminophen-caffeine (FIORICET, ESGIC) 50-325-40 MG tablet Take 1-2 tablets by mouth every 6 (six) hours as needed for headache. 02/26/17 02/26/18  Hinda Kehr, MD  Chromium Picolinate 500 MCG TABS Take 1 tablet by mouth daily.    [provider]  Cinnamon 500 MG capsule Take by mouth.  [provider]  fenofibrate 160 MG tablet 160 mg daily. 06/16/14   [provider]  GARCINIA CAMBOGIA-CHROMIUM PO Take 1,000 mg by mouth daily.    [provider]  glipiZIDE (GLUCOTROL XL) 5 MG 24 hr tablet Take 5 mg by mouth daily. 2 tablets daily 09/17/14   [provider]  glucose blood (FREESTYLE LITE) test strip USE AS DIRECTED TWICE DAILY 11/09/15   [provider]  Insulin Pen Needle (BD PEN NEEDLE NANO U/F) 32G X 4 MM MISC USE AS DIRECTED 11/09/15   [provider]  L-Lysine 500 MG TABS Take 1 tablet by mouth daily.    [provider]  Liraglutide (VICTOZA) 18 MG/3ML SOPN Inject into the skin. 08/22/15 02/01/17  [provider]  losartan-hydrochlorothiazide (HYZAAR) 100-25 MG per tablet Take 1 tablet by mouth daily. 11/24/13   [provider]  Magnesium 500 MG TABS Take 1 tablet by mouth daily.    [provider]  Milk Thistle 250 MG CAPS Take 1 capsule by mouth daily.    [provider]  niacin 500 MG tablet Take 500 mg by mouth daily.    [provider]  PARoxetine (PAXIL) 20 MG tablet Take 1 tablet by mouth daily. 12/07/13 12/07/14  [provider]  PARoxetine (PAXIL) 20 MG tablet Take by mouth. 12/07/13   [provider]  rOPINIRole (REQUIP) 0.5 MG tablet 1 tablet daily. 09/10/14   [provider]  Saw Palmetto 450 MG CAPS Take 1 capsule by mouth daily.    [provider]  tiotropium (SPIRIVA) 18 MCG inhalation capsule Place into inhaler and inhale. 03/23/16   [provider]  traMADol (ULTRAM) 50 MG tablet Take 1-2 tablets by mouth. Every 6 hrs as needed for pain    [provider]  VANADIUM PO Take 500 mcg by mouth daily.    [provider]  vitamin A 8000 UNIT capsule Take 8,000 Units by mouth daily. Takes 2 tablets daily    [provider]  vitamin B-12 (CYANOCOBALAMIN) 1000 MCG tablet Take 1,000 mcg by mouth daily.    [provider]  Zinc 50 MG TABS Take 1 tablet by mouth daily.    [provider]    Allergies Patient has no known allergies.  Family History  Problem Relation Age of Onset  . Kidney Stones Father   . Kidney disease Neg Hx   . Prostate cancer Neg Hx   . Kidney cancer Neg Hx   . Bladder Cancer Neg Hx     Social History Social History  Substance Use Topics  . Smoking status: Former Smoker    Packs/day: 0.00    Types: Cigarettes  . Smokeless tobacco: Former Systems developer     Comment: quit 1992  . Alcohol use 8.4 oz/week    14 Standard drinks or equivalent per  week    Review of Systems Constitutional: No fever/chills Eyes: No visual changes. ENT: No sore throat. Cardiovascular: Denies chest pain. Respiratory: Denies shortness of breath. Gastrointestinal: No abdominal pain.  No nausea, no vomiting.  No diarrhea.  No constipation. Genitourinary: Negative for dysuria. Musculoskeletal: Negative for neck pain.  Negative for back pain. Integumentary: Negative for rash. Neurological: Negative for headaches, focal weakness or numbness.   ____________________________________________   PHYSICAL EXAM:  VITAL SIGNS: ED Triage Vitals  Enc Vitals Group     BP 02/26/17 1348 (!) 181/83     Pulse Rate 02/26/17 1348 71     Resp  02/26/17 1348 18     Temp 02/26/17 1348 98.4 F (36.9 C)     Temp Source 02/26/17 1348 Oral     SpO2 02/26/17 1348 96 %     Weight 02/26/17 1354 113.9 kg (251 lb)     Height 02/26/17 1354 1.88 m (6\' 2" )     Head Circumference --      Peak Flow --      Pain Score 02/26/17 1353 10     Pain Loc --      Pain Edu? --      Excl. in [place]? --     Constitutional: Alert and oriented. Well appearing and in no acute distress. Eyes: Conjunctivae are normal. PERRL. EOMI. Head: Atraumatic. no tenderness to palpation of the temples bilaterally Nose: No congestion/rhinnorhea. Mouth/Throat: Mucous membranes are moist. Neck: No stridor.  No meningeal signs.   Cardiovascular: Normal rate, regular rhythm. Good peripheral circulation. Grossly normal heart sounds. Respiratory: Normal respiratory effort.  No retractions. Lungs CTAB. Gastrointestinal: Soft and nontender. No distention.  Musculoskeletal: No lower extremity tenderness nor edema. No gross deformities of extremities. Neurologic:  Normal speech and language. No gross focal neurologic deficits are appreciated.  Skin:  Skin is warm, dry and intact. No rash noted. Psychiatric: Mood and affect are normal. Speech and behavior are  normal.  ____________________________________________   LABS (all labs ordered are listed, but only abnormal results are displayed)  Labs Reviewed  BASIC METABOLIC PANEL - Abnormal; Notable for the following:       Result Value   Sodium 133 (*)    Chloride 98 (*)    Glucose, Bld 348 (*)    All other components within normal limits  CBC  TROPONIN I   ____________________________________________  EKG  ED ECG REPORT I, Leala Bryand, the attending physician, personally viewed and interpreted this ECG.  Date: 02/26/2017 EKG Time: 13:39 Rate: 71 Rhythm: normal sinus rhythm with occasional PVC QRS Axis: normal Intervals: normal ST/T Wave abnormalities: normal Narrative Interpretation: no evidence of acute ischemia  ____________________________________________  RADIOLOGY   Dg Chest 2 View  Result Date: 02/26/2017 CLINICAL DATA:  Intermittent left chest pain for 7-8 months. EXAM: CHEST  2 VIEW COMPARISON:  None. FINDINGS: The lungs are clear. Heart size is normal. No pneumothorax or pleural effusion. Aortic atherosclerosis noted. No focal bony abnormality. IMPRESSION: No acute disease. Atherosclerosis. Electronically Signed   By: Inge Rise M.D.   On: 02/26/2017 14:25   Ct Head Wo Contrast  Result Date: 02/26/2017 CLINICAL DATA:  Headache for the past 7 weeks. EXAM: CT HEAD WITHOUT CONTRAST TECHNIQUE: Contiguous axial images were obtained from the base of the skull through the vertex without intravenous contrast. COMPARISON:  04/17/2012. FINDINGS: Brain: Diffusely enlarged ventricles and subarachnoid spaces. Patchy white matter low density in both cerebral hemispheres. No intracranial hemorrhage, mass lesion or CT evidence of acute infarction. Vascular: No hyperdense vessel or unexpected calcification. Skull: Stable right posterior occipital osteoma. Sinuses/Orbits: Unremarkable. Other: None IMPRESSION: 1. No acute abnormality. 2. Minimal diffuse cerebral and cerebellar  atrophy. 3. Minimal chronic small vessel white matter ischemic changes in both cerebral hemispheres. Electronically Signed   By: Claudie Revering M.D.   On: 02/26/2017 16:46    ____________________________________________   PROCEDURES  Critical Care performed: No   Procedure(s) performed:   Procedures   ____________________________________________   INITIAL IMPRESSION / ASSESSMENT AND PLAN / ED COURSE  As part of my medical decision making, I reviewed the following data within the electronic  MEDICAL RECORD NUMBER History obtained from family, Nursing notes reviewed and incorporated, EKG interpreted  and Radiograph reviewed     Differential diagnosis includes, but is not limited to, intracranial hemorrhage, meningitis/encephalitis, previous head trauma, cavernous venous thrombosis, tension headache, temporal arteritis, migraine or migraine equivalent, idiopathic intracranial hypertension, normal pressure hydrocephalus,and non-specific headache.  I will obtain a CT scan to rule out any acute intracranial pathology, but I have a low suspicion.  He may be suffering from tension headaches or atypical migraines, but there is unlikely to be an emergent pathology identified today.  he agrees with the plan.  Because his pain is very mild at this point there is no indication to place an IV and start treating with migraine cocktail..  Regarding the chest pain.  He is very low risk particular given the extended ration of symptoms. his lab work is reassuring.  Even though he does have hyperglycemia he also is an insulin-dependent diabetic.  His CBC is normal and he has a negative troponin and a normal chest x-ray with no evidence of acute ischemia on his EKG.  He does have hypertension which in theory could be continued to both the chest pain and headache, but again given the duration of symptoms and the fact that he does have a primary care doctor, it is likely that outpatient follow-up is more appropriate  than acute intervention in the emergency department.  I discussed all of this with the patient who understands and agrees.   Clinical Course as of Feb 27 2107  Tue Feb 26, 2017  1805 unremarkable and nonacute head CT.  I updated the patient and he said his headache is gone at this time and does not want any IV medications.  He asked for something to help him sleep but I explained this is best dressed by his PCP.  I will give him a prescription for Fioricet to try during the day but I explained it has caffeine and he likely should not take it at night risk even more trouble with sleeping.  He understands and agrees with the plan.  I gave my usual and customary return precautions.     [CF]    Clinical Course User Index [CF] Hinda Kehr, MD    ____________________________________________  FINAL CLINICAL IMPRESSION(S) / ED DIAGNOSES  Final diagnoses:  Essential hypertension  Nonintractable episodic headache, unspecified headache type  Atypical chest pain     MEDICATIONS GIVEN DURING THIS VISIT:  Medications - No data to display   NEW OUTPATIENT MEDICATIONS STARTED DURING THIS VISIT:  Discharge Medication List as of 02/26/2017  6:10 PM    START taking these medications   Details  butalbital-acetaminophen-caffeine (FIORICET, ESGIC) 50-325-40 MG tablet Take 1-2 tablets by mouth every 6 (six) hours as needed for headache., Starting Tue 02/26/2017, Until Wed 02/26/2018, Print        Discharge Medication List as of 02/26/2017  6:10 PM      Discharge Medication List as of 02/26/2017  6:10 PM       Note:  This document was prepared using Dragon voice recognition software and may include unintentional dictation errors.    Hinda Kehr, MD 02/26/17 2108

## 2017-02-26 NOTE — ED Triage Notes (Signed)
Pt in via POV with complaints of intermittent left side chest pain x approximately 7-8 months, denies any accompanying symptoms.  Pt most prominent complaint is headache x approximately 7 weeks, pt reports "shooting pains throughout my head."  Pt reports occasional tremor to left hand with headache.  Vitals WDL, NAD noted at this time.

## 2017-02-26 NOTE — ED Notes (Signed)
Pt presents with headache x 7 weeks, intermittent chest pain x 7 months. Pt states that he has not consulted his pcp, that he came in today because he is tired of the pain. Pt alert & oriented with NAD noted.

## 2018-08-13 DIAGNOSIS — E291 Testicular hypofunction: Secondary | ICD-10-CM | POA: Insufficient documentation

## 2018-08-13 DIAGNOSIS — Z125 Encounter for screening for malignant neoplasm of prostate: Secondary | ICD-10-CM | POA: Insufficient documentation

## 2018-11-10 NOTE — Progress Notes (Signed)
11/11/2018 11:42 AM   Paul Goodman 08-26-1945 [id number]  Referring provider: [name], MD [address] [facility] [place],  [place] [address]  Chief Complaint  Patient presents with   Erectile Dysfunction    HPI: Patient is a 73 yo male with ED and BPH with LU TS who presents today regarding his ED.    BPH WITH LUTS  (prostate and/or bladder) IPSS score:  5/2   Previous score: 16/2      Major complaint(s):  No complaints at this visit.  Denies any dysuria, hematuria or suprapubic pain.   Denies any recent fevers, chills, nausea or vomiting.  He does not have a family history of PCa.  IPSS    Row Name 11/11/18 1000         International Prostate Symptom Score   How often have you had the sensation of not emptying your bladder?  Not at All     How often have you had to urinate less than every two hours?  Not at All     How often have you found you stopped and started again several times when you urinated?  Not at All     How often have you found it difficult to postpone urination?  Less than half the time     How often have you had a weak urinary stream?  Not at All     How often have you had to strain to start urination?  Not at All     How many times did you typically get up at night to urinate?  3 Times     Total IPSS Score  5       Quality of Life due to urinary symptoms   If you were to spend the rest of your life with your urinary condition just the way it is now how would you feel about that?  Mostly Satisfied        Score:  1-7 Mild 8-19 Moderate 20-35 Severe  Erectile dysfunction His SHIM score is 5, which is severe ED.   His previous SHIM score was 5.  He has been having difficulty with erections for several years.  His major complaint is no erections.  His libido is preserved.   His risk factors for ED are age, BPH, DM, COPD, HTN and sleep apnea.  He denies any painful erections or curvatures with his erections.   He is no longer  having spontaneous erections.  He has tried Trimix in the past, but he finds the injections painful     SHIM    Row Name 11/11/18 1035         SHIM: Over the last 6 months:   How do you rate your confidence that you could get and keep an erection?  Very Low     When you had erections with sexual stimulation, how often were your erections hard enough for penetration (entering your partner)?  Almost Never or Never     During sexual intercourse, how often were you able to maintain your erection after you had penetrated (entered) your partner?  Almost Never or Never     During sexual intercourse, how difficult was it to maintain your erection to completion of intercourse?  Extremely Difficult     When you attempted sexual intercourse, how often was it satisfactory for you?  Almost Never or Never       SHIM Total Score   SHIM  5  Score: 1-7 Severe ED 8-11 Moderate ED 12-16 Mild-Moderate ED 17-21 Mild ED 22-25 No ED   PMH: Past Medical History:  Diagnosis Date   Anxiety    Arthritis    COPD (chronic obstructive pulmonary disease) ([place])    Diabetes ([place])    ED (erectile dysfunction)    Heart murmur    Hyperlipidemia    Hypertension    Melanoma ([place])    Sleep apnea     Surgical History: Past Surgical History:  Procedure Laterality Date   EYE SURGERY Right    cancer   HAND SURGERY Left 1966   KNEE SURGERY Left 1973   TONSILLECTOMY  1964    Home Medications:  Allergies as of 11/11/2018   No Known Allergies     Medication List       Accurate as of November 11, 2018 11:42 AM. If you have any questions, ask your nurse or doctor.        STOP taking these medications   Chromium Picolinate 500 MCG Tabs Stopped by: Rishan Oyama, PA-C   GARCINIA CAMBOGIA-CHROMIUM PO Stopped by: Talayeh Bruinsma, PA-C   Milk Thistle 250 MG Caps Stopped by: Prospero Mahnke, PA-C   traMADol 50 MG tablet Commonly known as: ULTRAM Stopped by: Giann Obara, PA-C     VANADIUM PO Stopped by: Imagine Nest, PA-C   Victoza 18 MG/3ML Sopn Generic drug: liraglutide Stopped by: Cari Burgo, PA-C   vitamin A 8000 UNIT capsule Stopped by: Greyson Peavy, PA-C     TAKE these medications   albuterol 108 (90 Base) MCG/ACT inhaler Commonly known as: VENTOLIN HFA INHALE 2 PUFFS EVERY 4 - 6 HOURS AS NEEDED   amLODipine 5 MG tablet Commonly known as: NORVASC 5 mg daily.   aspirin EC 81 MG tablet Take 81 mg by mouth daily.   BD Pen Needle Nano U/F 32G X 4 MM Misc Generic drug: Insulin Pen Needle USE AS DIRECTED   Cinnamon 500 MG capsule Take by mouth.   fenofibrate 160 MG tablet 160 mg daily.   FREESTYLE LITE test strip Generic drug: glucose blood USE AS DIRECTED TWICE DAILY   glipiZIDE 5 MG 24 hr tablet Commonly known as: GLUCOTROL XL Take 5 mg by mouth daily. 2 tablets daily   L-Lysine 500 MG Tabs Take 1 tablet by mouth daily.   losartan-hydrochlorothiazide 100-25 MG tablet Commonly known as: HYZAAR Take 1 tablet by mouth daily.   Magnesium 500 MG Tabs Take 1 tablet by mouth daily.   niacin 500 MG tablet Take 500 mg by mouth daily.   PARoxetine 20 MG tablet Commonly known as: PAXIL Take 1 tablet by mouth daily.   PARoxetine 20 MG tablet Commonly known as: PAXIL Take by mouth.   rOPINIRole 0.5 MG tablet Commonly known as: REQUIP 1 tablet daily.   Saw Palmetto 450 MG Caps Take 1 capsule by mouth daily.   tiotropium 18 MCG inhalation capsule Commonly known as: [place] into inhaler and inhale.   vitamin B-12 1000 MCG tablet Commonly known as: CYANOCOBALAMIN Take 1,000 mcg by mouth daily.   Zinc 50 MG Tabs Take 1 tablet by mouth daily.       Allergies: No Known Allergies  Family History: Family History  Problem Relation Age of Onset   Kidney Stones Father    Kidney disease Neg Hx    Prostate cancer Neg Hx    Kidney cancer Neg Hx    Bladder Cancer Neg Hx     Social History:  reports  that he has quit smoking. His smoking use included cigarettes. He smoked 0.00 packs per day. He has quit using smokeless tobacco. He reports current alcohol use of about 14.0 standard drinks of alcohol per week. He reports that he does not use drugs.  ROS: UROLOGY Frequent Urination?: No Hard to postpone urination?: No Burning/pain with urination?: No Get up at night to urinate?: No Leakage of urine?: No Urine stream starts and stops?: No Trouble starting stream?: No Do you have to strain to urinate?: No Blood in urine?: No Urinary tract infection?: No Sexually transmitted disease?: No Injury to kidneys or bladder?: No Painful intercourse?: No Weak stream?: No Erection problems?: Yes Penile pain?: No  Gastrointestinal Nausea?: No Vomiting?: No Indigestion/heartburn?: No Diarrhea?: No Constipation?: No  Constitutional Fever: No Night sweats?: No Weight loss?: No Fatigue?: No  Skin Skin rash/lesions?: No Itching?: No  Eyes Blurred vision?: No Double vision?: No  Ears/Nose/Throat Sore throat?: No Sinus problems?: No  Hematologic/Lymphatic Swollen glands?: No Easy bruising?: No  Cardiovascular Leg swelling?: No Chest pain?: No  Respiratory Cough?: No Shortness of breath?: Yes  Endocrine Excessive thirst?: No  Musculoskeletal Back pain?: No Joint pain?: No  Neurological Headaches?: No Dizziness?: No  Psychologic Depression?: No Anxiety?: No  Physical Exam: BP 126/66 (BP Location: Left Arm, Patient Position: Sitting, Cuff Size: Normal)    Pulse 72    Ht 6\' 2"  (1.88 m)    Wt 265 lb (120.2 kg)    BMI 34.02 kg/m   Constitutional:  Well nourished. Alert and oriented, No acute distress. HEENT: [place] AT, moist mucus membranes.  Trachea midline, no masses. Cardiovascular: No clubbing, cyanosis, or edema. Respiratory: Normal respiratory effort, no increased work of breathing. GI: Abdomen is soft, non tender, non distended, no abdominal masses. Liver and  spleen not palpable.  No hernias appreciated.  Stool sample for occult testing is not indicated.   GU: No CVA tenderness.  No bladder fullness or masses.  Patient with uncircumcised phallus. Foreskin easily retracted  Urethral meatus is patent.  No penile discharge. No penile lesions or rashes. Scrotum without lesions, cysts, rashes and/or edema.  Testicles are located scrotally bilaterally. No masses are appreciated in the testicles. Left and right epididymis are normal. Rectal: Patient with  normal sphincter tone. Anus and perineum without scarring or rashes. No rectal masses are appreciated. Prostate is approximately 50  grams, no nodules are appreciated. Seminal vesicles are normal. Skin: No rashes, bruises or suspicious lesions. Lymph: No cervical or inguinal adenopathy. Neurologic: Grossly intact, no focal deficits, moving all 4 extremities. Psychiatric: Normal mood and affect.  Laboratory Data: PSA History  2.1 ng/mL on 01/19/2016   0.88 ng/mL in 01/2017  0.74 ng/mL in 08/2018  Lab Results  Component Value Date   TSH 0.764 01/19/2016    I have reviewed the labs.    Assessment & Plan:    1. BPH with LUTS - IPSS score is 5/2, it is improved - Continue conservative management, avoiding bladder irritants and timed voiding's - RTC in 12 months for IPSS, PSA and exam   2. Erectile dysfunction - SHIM score is 5 - didn't find [facility] Trimix effective - will return for Edex trial    Return for please schedule an EDEX trial - has to be a morning appointment .  These notes generated with voice recognition software. I apologize for typographical errors.  Zara Council, [facility] [address] [place], [place]  [address] °[phone] ° °

## 2018-11-11 ENCOUNTER — Encounter: Payer: Self-pay | Admitting: Urology

## 2018-11-11 ENCOUNTER — Ambulatory Visit (INDEPENDENT_AMBULATORY_CARE_PROVIDER_SITE_OTHER): Payer: Federal, State, Local not specified - PPO | Admitting: Urology

## 2018-11-11 ENCOUNTER — Other Ambulatory Visit: Payer: Self-pay

## 2018-11-11 VITALS — BP 126/66 | HR 72 | Ht 74.0 in | Wt 265.0 lb

## 2018-11-11 DIAGNOSIS — N529 Male erectile dysfunction, unspecified: Secondary | ICD-10-CM

## 2018-11-11 DIAGNOSIS — N138 Other obstructive and reflux uropathy: Secondary | ICD-10-CM

## 2018-11-11 DIAGNOSIS — N401 Enlarged prostate with lower urinary tract symptoms: Secondary | ICD-10-CM | POA: Diagnosis not present

## 2018-12-17 NOTE — Progress Notes (Signed)
Mr. Biedermann presents today for a EDEX titration.  He is no longer having spontaneous erections.  He has had no response to PDE5i's.  He denies any history of sickle cell anemia or trait, a history of multiple myeloma or a history of leukemia.  He has not taken trazodone or a PDE5i's today.    Patient's left corpus cavernosum is identified.  An area near the base of the penis is cleansed with rubbing alcohol.  Careful to avoid the dorsal vein, 3 mcg of EDEX 20 mcg Lot # [id number]   Exp. Date 08/25/19 is injected at a 90 degree angle into the left corpus cavernosum near the base of the penis.  Patient experienced some penile fullness.    I then injected 3 mcg of the EDEX 20 mcg into the right corpus cavernosum and patient did not experience any more fullness.     He will return next week for a titration appointment of Trimix (30/2/60).    Advised patient of the condition of priapism, painful erection lasting for more than four hours, and to contact the office immediately or seek treatment in the ED

## 2018-12-18 ENCOUNTER — Ambulatory Visit: Payer: Federal, State, Local not specified - PPO | Admitting: Urology

## 2018-12-18 ENCOUNTER — Encounter: Payer: Self-pay | Admitting: Urology

## 2018-12-18 ENCOUNTER — Other Ambulatory Visit: Payer: Self-pay

## 2018-12-18 VITALS — BP 114/73 | HR 66 | Wt 266.0 lb

## 2018-12-18 DIAGNOSIS — N529 Male erectile dysfunction, unspecified: Secondary | ICD-10-CM

## 2018-12-22 NOTE — Progress Notes (Signed)
Paul Goodman presents today for a EDEX titration.  He is no longer having spontaneous erections.  He has had no response to PDE5i's.  He denies any history of sickle cell anemia or trait, a history of multiple myeloma or a history of leukemia.  He has not taken trazodone or a PDE5i's today.    Patient's left corpus cavernosum is identified.  An area near the base of the penis is cleansed with rubbing alcohol.  Careful to avoid the dorsal vein, 2 mcg of Trimix (30/2/60)  Lot # [id number]  Exp. Date 02/04/2019 is injected at a 90 degree angle into the left corpus cavernosum near the base of the penis.  Patient experienced some penile fullness.  I then injected 2 mcg of the Trimix (30/2/60) into the right corpus cavernosum and the patient experienced a semi-firm erection.    I then injected 2 mcg of the Trimix (30/2/60) into the left corpus cavernosum and the patient did not experience an increase in the firmness of his erection.    He feels comfortable with injecting himself and will inject himself with 8 mcg on Thursday (12/25/2018) and report results.    He would also like his testosterone checked as he states he has a history of low testosterone.  I explained to him that if his testosterone level is low, we will need a confirmatory level in two days prior to 10 am.      Advised patient of the condition of priapism, painful erection lasting for more than four hours, and to contact the office immediately or seek treatment in the ED

## 2018-12-23 ENCOUNTER — Encounter: Payer: Self-pay | Admitting: Urology

## 2018-12-23 ENCOUNTER — Ambulatory Visit: Payer: Federal, State, Local not specified - PPO | Admitting: Urology

## 2018-12-23 ENCOUNTER — Other Ambulatory Visit: Payer: Self-pay

## 2018-12-23 VITALS — BP 123/80 | HR 67 | Ht 74.0 in | Wt 268.0 lb

## 2018-12-23 DIAGNOSIS — N529 Male erectile dysfunction, unspecified: Secondary | ICD-10-CM | POA: Diagnosis not present

## 2018-12-24 LAB — TESTOSTERONE: Testosterone: 302 ng/dL (ref 264–916)

## 2019-01-20 ENCOUNTER — Encounter: Payer: Self-pay | Admitting: Family Medicine

## 2019-02-03 ENCOUNTER — Telehealth: Payer: Self-pay | Admitting: Family Medicine

## 2019-02-03 NOTE — Telephone Encounter (Signed)
Mr. Neave,  Have you injected yourself with anymore of the Trimix?  Zara Council, PA-C   I sent this message to patient via MyChart on 9/15 and he has not looked at it.  Tried calling Patient and unable to leave message voicemail was full.

## 2020-06-02 ENCOUNTER — Other Ambulatory Visit: Payer: Self-pay

## 2020-06-02 ENCOUNTER — Emergency Department: Payer: Medicare Other

## 2020-06-02 ENCOUNTER — Inpatient Hospital Stay: Payer: Medicare Other

## 2020-06-02 ENCOUNTER — Encounter: Payer: Self-pay | Admitting: *Deleted

## 2020-06-02 ENCOUNTER — Inpatient Hospital Stay
Admission: EM | Admit: 2020-06-02 | Discharge: 2020-06-06 | DRG: 062 | Disposition: A | Discharge status: Home / Self Care | Payer: Medicare Other | Attending: Internal Medicine | Admitting: Internal Medicine

## 2020-06-02 DIAGNOSIS — I428 Other cardiomyopathies: Secondary | ICD-10-CM | POA: Diagnosis not present

## 2020-06-02 DIAGNOSIS — R4701 Aphasia: Secondary | ICD-10-CM | POA: Diagnosis present

## 2020-06-02 DIAGNOSIS — E119 Type 2 diabetes mellitus without complications: Secondary | ICD-10-CM | POA: Diagnosis present

## 2020-06-02 DIAGNOSIS — I959 Hypotension, unspecified: Secondary | ICD-10-CM | POA: Diagnosis present

## 2020-06-02 DIAGNOSIS — E1169 Type 2 diabetes mellitus with other specified complication: Secondary | ICD-10-CM | POA: Diagnosis not present

## 2020-06-02 DIAGNOSIS — E785 Hyperlipidemia, unspecified: Secondary | ICD-10-CM | POA: Diagnosis present

## 2020-06-02 DIAGNOSIS — J449 Chronic obstructive pulmonary disease, unspecified: Secondary | ICD-10-CM | POA: Diagnosis present

## 2020-06-02 DIAGNOSIS — Z79899 Other long term (current) drug therapy: Secondary | ICD-10-CM

## 2020-06-02 DIAGNOSIS — I4821 Permanent atrial fibrillation: Secondary | ICD-10-CM

## 2020-06-02 DIAGNOSIS — Z7984 Long term (current) use of oral hypoglycemic drugs: Secondary | ICD-10-CM | POA: Diagnosis not present

## 2020-06-02 DIAGNOSIS — G4733 Obstructive sleep apnea (adult) (pediatric): Secondary | ICD-10-CM | POA: Diagnosis present

## 2020-06-02 DIAGNOSIS — R471 Dysarthria and anarthria: Secondary | ICD-10-CM | POA: Diagnosis present

## 2020-06-02 DIAGNOSIS — F419 Anxiety disorder, unspecified: Secondary | ICD-10-CM | POA: Diagnosis present

## 2020-06-02 DIAGNOSIS — I10 Essential (primary) hypertension: Secondary | ICD-10-CM | POA: Diagnosis present

## 2020-06-02 DIAGNOSIS — I639 Cerebral infarction, unspecified: Secondary | ICD-10-CM | POA: Diagnosis present

## 2020-06-02 DIAGNOSIS — Z794 Long term (current) use of insulin: Secondary | ICD-10-CM | POA: Diagnosis not present

## 2020-06-02 DIAGNOSIS — K219 Gastro-esophageal reflux disease without esophagitis: Secondary | ICD-10-CM | POA: Diagnosis present

## 2020-06-02 DIAGNOSIS — I63411 Cerebral infarction due to embolism of right middle cerebral artery: Principal | ICD-10-CM | POA: Diagnosis present

## 2020-06-02 DIAGNOSIS — Z7982 Long term (current) use of aspirin: Secondary | ICD-10-CM | POA: Diagnosis not present

## 2020-06-02 DIAGNOSIS — I5021 Acute systolic (congestive) heart failure: Secondary | ICD-10-CM | POA: Diagnosis not present

## 2020-06-02 DIAGNOSIS — G8194 Hemiplegia, unspecified affecting left nondominant side: Secondary | ICD-10-CM | POA: Diagnosis present

## 2020-06-02 DIAGNOSIS — R29810 Facial weakness: Secondary | ICD-10-CM | POA: Diagnosis present

## 2020-06-02 DIAGNOSIS — Z87891 Personal history of nicotine dependence: Secondary | ICD-10-CM

## 2020-06-02 DIAGNOSIS — E669 Obesity, unspecified: Secondary | ICD-10-CM | POA: Diagnosis present

## 2020-06-02 DIAGNOSIS — Z20822 Contact with and (suspected) exposure to covid-19: Secondary | ICD-10-CM | POA: Diagnosis present

## 2020-06-02 DIAGNOSIS — R29704 NIHSS score 4: Secondary | ICD-10-CM | POA: Diagnosis present

## 2020-06-02 DIAGNOSIS — I429 Cardiomyopathy, unspecified: Secondary | ICD-10-CM

## 2020-06-02 DIAGNOSIS — I4891 Unspecified atrial fibrillation: Secondary | ICD-10-CM | POA: Diagnosis present

## 2020-06-02 LAB — CBC
HCT: 46.9 % (ref 39.0–52.0)
Hemoglobin: 16 g/dL (ref 13.0–17.0)
MCH: 28.5 pg (ref 26.0–34.0)
MCHC: 34.1 g/dL (ref 30.0–36.0)
MCV: 83.5 fL (ref 80.0–100.0)
Platelets: 213 10*3/uL (ref 150–400)
RBC: 5.62 MIL/uL (ref 4.22–5.81)
RDW: 13.1 % (ref 11.5–15.5)
WBC: 10 10*3/uL (ref 4.0–10.5)
nRBC: 0 % (ref 0.0–0.2)

## 2020-06-02 LAB — COMPREHENSIVE METABOLIC PANEL
ALT: 19 U/L (ref 0–44)
AST: 19 U/L (ref 15–41)
Albumin: 3.9 g/dL (ref 3.5–5.0)
Alkaline Phosphatase: 65 U/L (ref 38–126)
Anion gap: 13 (ref 5–15)
BUN: 23 mg/dL (ref 8–23)
CO2: 24 mmol/L (ref 22–32)
Calcium: 9.3 mg/dL (ref 8.9–10.3)
Chloride: 100 mmol/L (ref 98–111)
Creatinine, Ser: 1.47 mg/dL — ABNORMAL HIGH (ref 0.61–1.24)
GFR, Estimated: 50 mL/min — ABNORMAL LOW (ref >60)
Glucose, Bld: 142 mg/dL — ABNORMAL HIGH (ref 70–99)
Potassium: 3.3 mmol/L — ABNORMAL LOW (ref 3.5–5.1)
Sodium: 137 mmol/L (ref 135–145)
Total Bilirubin: 0.5 mg/dL (ref 0.3–1.2)
Total Protein: 7.4 g/dL (ref 6.5–8.1)

## 2020-06-02 LAB — TROPONIN I (HIGH SENSITIVITY): Troponin I (High Sensitivity): 296 ng/L (ref <18)

## 2020-06-02 LAB — DIFFERENTIAL
Abs Immature Granulocytes: 0.03 10*3/uL (ref 0.00–0.07)
Basophils Absolute: 0 10*3/uL (ref 0.0–0.1)
Basophils Relative: 0 %
Eosinophils Absolute: 0.1 10*3/uL (ref 0.0–0.5)
Eosinophils Relative: 1 %
Immature Granulocytes: 0 %
Lymphocytes Relative: 24 %
Lymphs Abs: 2.4 10*3/uL (ref 0.7–4.0)
Monocytes Absolute: 0.8 10*3/uL (ref 0.1–1.0)
Monocytes Relative: 8 %
Neutro Abs: 6.6 10*3/uL (ref 1.7–7.7)
Neutrophils Relative %: 67 %

## 2020-06-02 LAB — GLUCOSE, CAPILLARY: Glucose-Capillary: 135 mg/dL — ABNORMAL HIGH (ref 70–99)

## 2020-06-02 LAB — PROTIME-INR
INR: 1.1 (ref 0.8–1.2)
Prothrombin Time: 13.7 seconds (ref 11.4–15.2)

## 2020-06-02 LAB — TSH: TSH: 1.261 u[IU]/mL (ref 0.350–4.500)

## 2020-06-02 LAB — SARS CORONAVIRUS 2 BY RT PCR (HOSPITAL ORDER, PERFORMED IN ~~LOC~~ HOSPITAL LAB): SARS Coronavirus 2: NEGATIVE

## 2020-06-02 LAB — ETHANOL: Alcohol, Ethyl (B): <10 mg/dL (ref <10)

## 2020-06-02 LAB — APTT: aPTT: 29 seconds (ref 24–36)

## 2020-06-02 LAB — MAGNESIUM: Magnesium: 2 mg/dL (ref 1.7–2.4)

## 2020-06-02 MED ORDER — ACETAMINOPHEN 650 MG RE SUPP: 650.0000 mg | RECTAL | Status: DC | PRN

## 2020-06-02 MED ORDER — IOHEXOL 350 MG/ML SOLN
100.0000 mL | Freq: Once | INTRAVENOUS | Status: AC | PRN
  Administered 2020-06-02: 100 mL via INTRAVENOUS

## 2020-06-02 MED ORDER — LABETALOL HCL 5 MG/ML IV SOLN
10.0000 mg | INTRAVENOUS | Status: DC | PRN
  Filled 2020-06-02: qty 4

## 2020-06-02 MED ORDER — PANTOPRAZOLE SODIUM 40 MG IV SOLR
40.0000 mg | Freq: Every day | INTRAVENOUS | Status: DC
  Administered 2020-06-02 – 2020-06-05 (×4): 40 mg via INTRAVENOUS
  Filled 2020-06-02 (×4): qty 40

## 2020-06-02 MED ORDER — ACETAMINOPHEN 160 MG/5ML PO SOLN
650.0000 mg | ORAL | Status: DC | PRN
  Filled 2020-06-02: qty 20.3

## 2020-06-02 MED ORDER — METOPROLOL TARTRATE 5 MG/5ML IV SOLN
5.0000 mg | INTRAVENOUS | Status: DC | PRN
  Administered 2020-06-02 (×2): 5 mg via INTRAVENOUS
  Filled 2020-06-02 (×2): qty 5

## 2020-06-02 MED ORDER — SENNOSIDES-DOCUSATE SODIUM 8.6-50 MG PO TABS: 1.0000 | ORAL_TABLET | Freq: Every evening | ORAL | Status: DC | PRN

## 2020-06-02 MED ORDER — SODIUM CHLORIDE 0.9% FLUSH
3.0000 mL | Freq: Once | INTRAVENOUS | Status: AC
  Administered 2020-06-02: 3 mL via INTRAVENOUS

## 2020-06-02 MED ORDER — SODIUM CHLORIDE 0.9 % IV SOLN
50.0000 mL | Freq: Once | INTRAVENOUS | Status: AC
  Administered 2020-06-02: 50 mL via INTRAVENOUS

## 2020-06-02 MED ORDER — LORAZEPAM 2 MG/ML IJ SOLN
0.5000 mg | Freq: Four times a day (QID) | INTRAMUSCULAR | Status: DC | PRN
  Administered 2020-06-04: 0.5 mg via INTRAVENOUS
  Filled 2020-06-02: qty 1

## 2020-06-02 MED ORDER — ACETAMINOPHEN 325 MG PO TABS
650.0000 mg | ORAL_TABLET | ORAL | Status: DC | PRN
  Administered 2020-06-06: 650 mg via ORAL
  Filled 2020-06-02: qty 2

## 2020-06-02 MED ORDER — ALTEPLASE (STROKE) FULL DOSE INFUSION
90.0000 mg | Freq: Once | INTRAVENOUS | Status: AC
  Administered 2020-06-02: 90 mg via INTRAVENOUS
  Filled 2020-06-02: qty 100

## 2020-06-02 MED ORDER — STROKE: EARLY STAGES OF RECOVERY BOOK: Freq: Once | Status: DC

## 2020-06-02 NOTE — Progress Notes (Signed)
CD Stroke, upon arrival patient was being assessed by medical staff. Spoke with wife who was in room. I stepped away to limit conversation for telecommunication with doctor. Asked to be paged if and when needed.

## 2020-06-02 NOTE — ED Triage Notes (Signed)
Pt to triage via wheelchair. Pt has slurred speech and headache for 45 minutes.  Pt has left side weakness.  Pt alert.   Family with pt.

## 2020-06-02 NOTE — H&P (Signed)
NAME:  Paul Goodman, MRN:  [id number], DOB:  [birth year], LOS: 0 ADMISSION DATE:  06/02/2020, CONSULTATION DATE: 06/02/2020 REFERRING MD: Dr. Charna Archer, CHIEF COMPLAINT:   Numbness, weakness, aphasia  Brief History:  75 year old male presenting to the ED as a code stroke status post TPA administration admitted to the ICU for monitoring.  History of Present Illness:  75 year old male presenting to the ED from home with left-sided weakness, slurred speech and right-sided headache.  The patient reported that at approximately 1915 he noticed coffee was dripping out of his mouth onto his chest while he was attempting to drink it.  At the same time his left hand was numb and he was unable to move his fingers.  When he attempted to speak his words were slurred.  Upon arrival to the ED code stroke was called, patient was taken for CT head which showed an aspect score of 10 and no acute intracranial bleeding. TPA was administered for an acute CVA.  Follow-up CTA and perfusion scan of brain showed no evidence of infarct but regions of slightly delayed perfusion involving bilateral watershed territories. Patient denied experiencing any symptoms like this in the past.  PCCM consulted for admission and post tPA monitoring in the ICU.  Past Medical History:  Hypertension Hyperlipidemia Type 2 diabetes mellitus COPD OSA Significant Hospital Events:  06/02/2020-code stroke initiated, tPA administered, admitted to ICU for monitoring  Consults:  Neurology  Procedures:    Significant Diagnostic Tests:  06/02/2020 CT w/o contrast >> aspects= 10, no acute intracranial abnormality, chronic ischemic microangiopathic angiopathy 06/02/2020 CTa head & neck, with cerebral perfusion >> no evidence of infarct, regions of slightly delayed perfusion involving bilateral watershed territories Micro Data:  06/02/2020 COVID-19 >> negative  Antimicrobials:    Interim History / Subjective:  Patient sitting up in  stretcher alert and responsive.  He is very hard of hearing, and has to read lips intermittently.  Patient reports since tPA administration obtaining some feeling back in his left upper extremity, but is still having difficulty moving all of his fingers at well.  No complaints of pain.  Patient failed bedside swallow, speech eval ordered for 06/03/2020.  Wife bedside, both updated on plan of care all questions and concerns answered  NIHSS: 2 (patient still has weakness on the left upper extremity in the fingers & some minor left lower facial weakness noted)  Labs/ Imaging personally reviewed Na+/ K+: 137/3.3 BUN/Cr.:  23/1.47 Hgb: 16  WBC/ TMAX: 10/37.1 Troponin: Pending  Objective   Blood pressure 108/82, pulse (!) 103, temperature 98.7 F (37.1 C), resp. rate 19, height 6\' 2"  (1.88 m), weight 118.8 kg, SpO2 (!) 20 %.       No intake or output data in the 24 hours ending 06/02/20 2138 Filed Weights   06/02/20 1950  Weight: 118.8 kg    Examination: General: Adult male, critically/chronically ill, NAD HEENT: MM pink/moist, anicteric, atraumatic, neck supple Neuro: *A&O x 4, able to follow commands, PERRL +3, MAE- weakness in L fingers CV: s1s2 Irregular in A-fib on monitor, no r/m/g Pulm: Regular, non labored on room air, breath sounds clear-BUL & diminished-BLL GI: soft, rounded, non tender, bs x 4 Skin: no rashes/lesions noted Extremities: warm/dry, pulses + 2 R/P, trace edema noted BLE  Resolved Hospital Problem list     Assessment & Plan:  Acute CVA New onset atrial fibrillation Patient reported weakness in the left upper extremity, inability to move fingers well, aphasia & coffee dripping out of mouth unintentionally  as well as a right-sided headache.  Last known well 06/02/2020 at 1900.  Initial NIH SS score: 4, modified Rankin scale 0 points.  Telemetry neurology consulted after code stroke initiation in the ED and TPA was administered.  CTA negative for LVO. -Modified  stroke scale every 15 minutes x2 hours, every 30 minutes x6 hours, every 1 hour x16 hours then per routine -Follow-up head CT 24 hours post TPA administration - Continuous cardiac monitoring -Labetalol every 2 as needed to maintain BP <180/105 & HR < 120 -Consider nicardipine drip if needed - Supplemental O2 to maintain SPO2 > 94% -Lipid panel & A1c ordered -Echocardiogram ordered -Neurology consulted, appreciate input -Daily BMP, replace electrolytes as needed -N.p.o. pending SPL eval (failed bedside swallow) -PT/OT ordered  Type 2 diabetes mellitus -A1c ordered -Every 4 CBG monitoring with SSI  Hypertension Hyperlipidemia -Lipid panel ordered for a.m. labs -Once patient has cleared speech eval, start statin coverage -Home blood pressure control regimen on hold until cleared by SLP eval: Amlodipine 5 mg daily, losartan-hydrochlorothiazide daily  COPD OSA-patient states he has a CPAP ordered outpatient but does not wear it Currently on room air. -Supplemental O2 to maintain SPO2 > 94% per CVA guidelines -Continuous pulse oximetry monitoring -Bronchodilators as needed -Continue home tiotropium  Anxiety Patient recently prescribed lorazepam 0.5-1 mg daily as needed.  Patient describes having episodes where he becomes anxious, feels as though his throat is closing and "passes out".  PCP ordered Ativan, patient states anecdotally that it has been helping. -Lorazepam as needed ordered IV until SLP clears the patient for safe swallowing  Best practice (evaluated daily)  Diet: N.p.o. Pain/Anxiety/Delirium protocol (if indicated): Home lorazepam ordered IV as needed VAP protocol (if indicated): N/A DVT prophylaxis: SCDs GI prophylaxis: Protonix Glucose control: Every 4 monitor with SSI Mobility: Strict bedrest for 24 hours Disposition: ICU  Goals of Care:  Last date of multidisciplinary goals of care discussion: 06/02/2020 Family and staff present: Patient, wife & APP Summary  of discussion: Discussed plan of care including post tPA administration monitoring and neurology consult, all questions and concerns answered Follow up goals of care discussion due: 06/03/2020 Code Status: Full  Labs   CBC: Recent Labs  Lab 06/02/20 2010  WBC 10.0  NEUTROABS 6.6  HGB 16.0  HCT 46.9  MCV 83.5  PLT 299    Basic Metabolic Panel: Recent Labs  Lab 06/02/20 2010  NA 137  K 3.3*  CL 100  CO2 24  GLUCOSE 142*  BUN 23  CREATININE 1.47*  CALCIUM 9.3   GFR: Estimated Creatinine Clearance: 61.3 mL/min (A) (by C-G formula based on SCr of 1.47 mg/dL (H)). Recent Labs  Lab 06/02/20 2010  WBC 10.0    Liver Function Tests: Recent Labs  Lab 06/02/20 2010  AST 19  ALT 19  ALKPHOS 65  BILITOT 0.5  PROT 7.4  ALBUMIN 3.9   No results for input(s): LIPASE, AMYLASE in the last 168 hours. No results for input(s): AMMONIA in the last 168 hours.  ABG No results found for: PHART, PCO2ART, PO2ART, HCO3, TCO2, ACIDBASEDEF, O2SAT   Coagulation Profile: Recent Labs  Lab 06/02/20 2010  INR 1.1    Cardiac Enzymes: No results for input(s): CKTOTAL, CKMB, CKMBINDEX, TROPONINI in the last 168 hours.  HbA1C: No results found for: HGBA1C  CBG: No results for input(s): GLUCAP in the last 168 hours.  Review of Systems: Positives in BOLD   Gen: Denies fever, chills, weight change, fatigue, night sweats HEENT: Denies  blurred vision, double vision, hearing loss, tinnitus, sinus congestion, rhinorrhea, sore throat, neck stiffness, dysphagia PULM: Denies shortness of breath, cough, sputum production, hemoptysis, wheezing CV: Denies chest pain, edema, orthopnea, paroxysmal nocturnal dyspnea, palpitations GI: Denies abdominal pain, nausea, vomiting, diarrhea, hematochezia, melena, constipation, change in bowel habits GU: Denies dysuria, hematuria, polyuria, oliguria, urethral discharge Endocrine: Denies hot or cold intolerance, polyuria, polyphagia or appetite  change Derm: Denies rash, dry skin, scaling or peeling skin change Heme: Denies easy bruising, bleeding, bleeding gums Neuro: Denies headache, numbness, weakness, slurred speech, loss of memory or consciousness  Past Medical History:  He,  has a past medical history of Anxiety, Arthritis, COPD (chronic obstructive pulmonary disease) ([place]), Diabetes ([place]), ED (erectile dysfunction), Heart murmur, Hyperlipidemia, Hypertension, Melanoma ([place]), and Sleep apnea.   Surgical History:   Past Surgical History:  Procedure Laterality Date  . EYE SURGERY Right    cancer  . HAND SURGERY Left 1966  . KNEE SURGERY Left 1973  . TONSILLECTOMY  1964     Social History:   reports that he has quit smoking. His smoking use included cigarettes. He smoked 0.00 packs per day. He has quit using smokeless tobacco. He reports previous alcohol use of about 14.0 standard drinks of alcohol per week. He reports current drug use. Drug: Marijuana.   Family History:  His family history includes Kidney Stones in his father. There is no history of Kidney disease, Prostate cancer, Kidney cancer, or Bladder Cancer.   Allergies No Known Allergies   Home Medications  Prior to Admission medications   Medication Sig Start Date End Date Taking? Authorizing Provider  albuterol (PROVENTIL HFA;VENTOLIN HFA) 108 (90 BASE) MCG/ACT inhaler INHALE 2 PUFFS EVERY 4 - 6 HOURS AS NEEDED 06/07/14   [provider]  amLODipine (NORVASC) 5 MG tablet 5 mg daily. 08/23/14   [provider]  aspirin EC 81 MG tablet Take 81 mg by mouth daily.    [provider]  Cinnamon 500 MG capsule Take by mouth.    [provider]  fenofibrate 160 MG tablet 160 mg daily. 06/16/14   [provider]  furosemide (LASIX) 20 MG tablet Take 20 mg by mouth daily as needed. 12/15/18   [provider]  glipiZIDE (GLUCOTROL XL) 5 MG 24 hr tablet Take 5 mg by mouth daily. 2 tablets daily 09/17/14   [provider]  glucose blood (FREESTYLE LITE) test strip USE AS DIRECTED TWICE DAILY 11/09/15   [provider]  glyBURIDE-metformin (GLUCOVANCE) 2.5-500 MG tablet Take by mouth daily. 12/01/18   [provider]  Insulin Pen Needle (BD PEN NEEDLE NANO U/F) 32G X 4 MM MISC USE AS DIRECTED 11/09/15   [provider]  L-Lysine 500 MG TABS Take 1 tablet by mouth daily.    [provider]  losartan-hydrochlorothiazide (HYZAAR) 100-25 MG per tablet Take 1 tablet by mouth daily. 11/24/13   [provider]  Magnesium 500 MG TABS Take 1 tablet by mouth daily.    [provider]  niacin 500 MG tablet Take 500 mg by mouth daily.    [provider]  PARoxetine (PAXIL) 20 MG tablet Take by mouth. 12/07/13   [provider]  rOPINIRole (REQUIP) 0.5 MG tablet 1 tablet daily. 09/10/14   [provider]  Saw Palmetto 450 MG CAPS Take 1 capsule by mouth daily.    [provider]  SOLIQUA 100-33 UNT-MCG/ML SOPN INJ 15 TO 30 UNI [place] ONCE A DAY UTD  11/11/18   [provider]  tiotropium (SPIRIVA) 18 MCG inhalation capsule Place into inhaler and inhale. 03/23/16   [provider]  vitamin B-12 (CYANOCOBALAMIN) 1000 MCG tablet Take 1,000 mcg by mouth daily.    [provider]  Zinc 50 MG TABS Take 1 tablet by mouth daily.    [provider]     Critical care time: 55 minutes       Venetia Night, AGACNP-BC Acute Care Nurse Practitioner [facility]   [phone] / [phone] Please see Amion for pager details.

## 2020-06-02 NOTE — ED Notes (Signed)
CBG monitor wont scan, pts cbg 174

## 2020-06-02 NOTE — ED Notes (Signed)
Jess[place] MD notified of pt beginning to have headache. Rates 2/10 in pain, no changes to mnih at this time. Stroke RN notified as well. No new orders at this time

## 2020-06-02 NOTE — Consult Note (Signed)
[facility] Consult Services   Date of Service:   06/02/2020 20:08:58  Diagnosis:     .  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 NIHSS: 4. CT head was unremarkable. Discussed risk vs benefits of alteplase and he agreed to it and given at 06/02/2020 20:36:11. CTA/CTP negative for LVO. Admit for right MCA stroke workup.   Metrics: Last Known Well: 06/02/2020 19:00:00 [facility] Notification Time: 06/02/2020 20:08:58 Arrival Time: 06/02/2020 19:43:00 Stamp Time: 06/02/2020 20:08:58 Initial Response Time: 06/02/2020 20:09:49 Symptoms: left face/arm weakness, slurred speech, and right sided headache. Marland Kitchen NIHSS Start Assessment Time: 06/02/2020 20:17:12 Thrombolytic Early Mix Decision Time: 06/02/2020 20:24:20 Patient is a candidate for Thrombolytic. Thrombolytic Medical Decision: 06/02/2020 20:24:29 Needle Time: 06/02/2020 20:36:11 Weight Noted by Staff: 118.8 kg  CT head showed no acute hemorrhage or acute core infarct.  Radiologist was not called back for review of advanced imaging CTA neck: No emergent vascular finding within the neck. Mild proximal right subclavian artery narrowing.  CTA head: No emergent vascular finding within the head. Moderate right and mild left cavernous ICA narrowing.  CTP: No evidence of infarct. There are regions of slightly delayed perfusion involving the bilateral watershed territories.   ED Physician notified of diagnostic impression and management plan on 06/02/2020 21:22:32.   Advanced Imaging: CTA Head and Neck Recommended: CTP Recommended:   Thrombolytic Contraindications:  Last Known Well > 4.5 hours: No CT Head showing hemorrhage: No Ischemic stroke within 3 months: No Severe head trauma within 3  months: No Intracranial/intraspinal surgery within 3 months: No History of intracranial hemorrhage: No Symptoms and signs consistent with an SAH: No GI malignancy or GI bleed within 21 days: No Coagulopathy: Platelets <100 000 /mm3, INR >1.7, aPTT>40 s, or PT >15 s: No Treatment dose of LMWH within the previous 24 hrs: No Use of NOACs in past 48 hours: No Glycoprotein IIb/IIIa receptor inhibitors use: No Symptoms consistent with infective endocarditis: No Suspected aortic arch dissection: No Intra-axial intracranial neoplasm: No  Thrombolytic Decision and Management Plan: Management with thrombolytic treatment was explained to the Patient as was risks and benefits and alternatives to the treatment. Patient agrees with the decision to proceed with thrombolytic treatment. . All questions were answered and the Patient expressed understanding of the treatment plan.  Our recommendations are outlined below.  Recommendations: IV Alteplase recommended.  Thrombolytic bolus given Without Complication.   IV Alteplase/Activase Total Dose - 90.0 mg IV Alteplase/Activase Bolus Dose - 9.0 mg IV Alteplase/Activase Infusion Dose - 81.0 mg   Routine post Thrombolytic monitoring including neuro checks and blood pressure control during/after treatment Monitor blood pressure Check blood pressure and neuro assessment every 15 min for 2 h, then every 30 min for 6 h, and finally every hour for 16 h.  Manage Blood Pressure per post Thrombolytic protocol.      .  Admission to ICU     .  CT brain 24 hours post Thrombolytic     .  NPO until swallowing screen performed and passed     .  No antiplatelet agents or anticoagulants (including heparin for DVT prophylaxis) in first 24 hours     .  No Foley catheter, nasogastric tube, arterial catheter or central venous catheter for 24 hr, unless absolutely necessary     .  Telemetry     .  Bedside swallow evaluation     .  HOB less than 30 degrees     .   Euglycemia     .  Avoid hyperthermia, PRN acetaminophen     .  DVT prophylaxis     .  Inpatient Neurology Consultation     .  Stroke evaluation as per inpatient neurology recommendations  Discussed with ED physician    ------------------------------------------------------------------------------  History of Present Illness: Patient is a 75 year old Male.  Patient was brought by private transportation with symptoms of left face/arm weakness, slurred speech, and right sided headache. .  Pt is a 61 YOM with PMH of HTN, HLD, DM II, COPD, OSA who presented with complaint of left face/arm weakness, slurred speech, and right sided headache. He stated symptoms started around 1900. He denied any prior stroke or ICH. He was in A.fib with rates upto 150s in ED.  Last seen normal was within 4.5 hours. There is no history of hemorrhagic complications or intracranial hemorrhage. There is no history of Recent Anticoagulants. There is no history of recent major surgery. There is no history of recent stroke.  Past Medical History:     . Hypertension     . Diabetes Mellitus     . Hyperlipidemia     . Atrial Fibrillation     . There is NO history of Coronary Artery Disease     . There is NO history of Stroke  Social History: Smoking: Former Alcohol Use: No Drug Use: No  Family History:Father: heart disease  Review of System: 14 Points Review of Systems was performed and was negative except mentioned in HPI.  Anticoagulant use:  No  Antiplatelet use: Yes ASA  Allergies:  NKDA   Examination: BP(104/86), Pulse(143), Blood Glucose(142) 1A: Level of Consciousness - Alert; keenly responsive + 0 1B: Ask Month and Age - Both Questions Right + 0 1C: Blink Eyes & Squeeze Hands - Performs Both Tasks + 0 2: Test Horizontal Extraocular Movements - Normal + 0 3: Test Visual Fields - No Visual Loss + 0 4: Test Facial Palsy (Use Grimace if Obtunded) - Partial paralysis (lower face) + 2 5A:  Test Left Arm Motor Drift - Drift, but doesn't hit bed + 1 5B: Test Right Arm Motor Drift - No Drift for 10 Seconds + 0 6A: Test Left Leg Motor Drift - No Drift for 5 Seconds + 0 6B: Test Right Leg Motor Drift - No Drift for 5 Seconds + 0 7: Test Limb Ataxia (FNF/Heel-Shin) - No Ataxia + 0 8: Test Sensation - Normal; No sensory loss + 0 9: Test Language/Aphasia - Normal; No aphasia + 0 10: Test Dysarthria - Mild-Moderate Dysarthria: Slurring but can be understood + 1 11: Test Extinction/Inattention - No abnormality + 0  NIHSS Score: 4  Pre-Morbid Modified Rankin Scale: 0 Points = No symptoms at all   Patient/Family was informed the Neurology Consult would occur via TeleHealth consult by way of interactive audio and video telecommunications and consented to receiving care in this manner.   Patient is being evaluated for possible acute neurologic impairment and high probability of imminent or life-threatening deterioration. I spent total of 50 minutes providing care to this patient, including time for face to face visit via telemedicine, review of medical records, imaging studies and discussion of findings with providers, the patient and/or family.   Dr Currie Paris   [facility] [phone]  Case [id number]

## 2020-06-02 NOTE — ED Provider Notes (Signed)
[facility] Emergency Department Provider Note   ____________________________________________   None    (approximate)  I have reviewed the triage vital signs and the nursing notes.   HISTORY  Chief Complaint Code Stroke    HPI Paul Goodman is a 75 y.o. male with past medical history of hypertension, hyperlipidemia, diabetes, and COPD who presents to the ED for possible stroke.  Patient reports that he was sitting drinking coffee about 1 hour prior to arrival at approximately 7:15 when he noticed the cough he was dripping out of his mouth and onto his chest.  He was also having a hard time keeping the cup up in his left hand and noticed that he was slurring his words.  He denies any numbness or weakness in his left lower extremity and has not noticed any numbness or weakness on the right side of his body.  He denies any changes in his vision.  He has never had similar symptoms in the past and does not take any blood thinners.  He had been feeling well up until the sudden onset of symptoms.        Past Medical History:  Diagnosis Date  . Anxiety   . Arthritis   . COPD (chronic obstructive pulmonary disease) ([place])   . Diabetes ([place])   . ED (erectile dysfunction)   . Heart murmur   . Hyperlipidemia   . Hypertension   . Melanoma ([place])   . Sleep apnea     Patient Active Problem List   Diagnosis Date Noted  . Prostate cancer screening 08/13/2018  . 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 Anxiety 01/14/2017  . 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 OSA on CPAP 01/14/2017  . Restless leg syndrome 01/14/2017  . Type 2 diabetes mellitus with hyperglycemia ([place]) 04/23/2016  . Arthritis 02/29/2016  . COPD (chronic obstructive pulmonary disease) ([place]) 02/29/2016  . Diabetes mellitus type 2, uncomplicated ([place]) 56/21/3086  . Hyperlipidemia, unspecified 02/29/2016  .  Hypertension 02/29/2016    Past Surgical History:  Procedure Laterality Date  . EYE SURGERY Right    cancer  . HAND SURGERY Left 1966  . KNEE SURGERY Left 1973  . TONSILLECTOMY  1964    Prior to Admission medications   Medication Sig Start Date End Date Taking? Authorizing Provider  albuterol (PROVENTIL HFA;VENTOLIN HFA) 108 (90 BASE) MCG/ACT inhaler INHALE 2 PUFFS EVERY 4 - 6 HOURS AS NEEDED 06/07/14   [provider]  amLODipine (NORVASC) 5 MG tablet 5 mg daily. 08/23/14   [provider]  aspirin EC 81 MG tablet Take 81 mg by mouth daily.    [provider]  Cinnamon 500 MG capsule Take by mouth.    [provider]  fenofibrate 160 MG tablet 160 mg daily. 06/16/14   [provider]  furosemide (LASIX) 20 MG tablet Take 20 mg by mouth daily as needed. 12/15/18   [provider]  glipiZIDE (GLUCOTROL XL) 5 MG 24 hr tablet Take 5 mg by mouth daily. 2 tablets daily 09/17/14   [provider]  glucose blood (FREESTYLE LITE) test strip USE AS DIRECTED TWICE DAILY 11/09/15   [provider]  glyBURIDE-metformin (GLUCOVANCE) 2.5-500 MG tablet Take by mouth daily. 12/01/18   [provider]  Insulin Pen Needle (BD PEN NEEDLE NANO U/F) 32G X 4 MM MISC USE AS DIRECTED 11/09/15   [provider]  L-Lysine 500 MG  TABS Take 1 tablet by mouth daily.    [provider]  losartan-hydrochlorothiazide (HYZAAR) 100-25 MG per tablet Take 1 tablet by mouth daily. 11/24/13   [provider]  Magnesium 500 MG TABS Take 1 tablet by mouth daily.    [provider]  niacin 500 MG tablet Take 500 mg by mouth daily.    [provider]  PARoxetine (PAXIL) 20 MG tablet Take by mouth. 12/07/13   [provider]  rOPINIRole (REQUIP) 0.5 MG tablet 1 tablet daily. 09/10/14   [provider]  Saw Palmetto 450 MG CAPS Take 1 capsule by mouth daily.    [provider]  SOLIQUA 100-33  UNT-MCG/ML SOPN INJ 15 TO 30 UNI [place] ONCE A DAY UTD 11/11/18   [provider]  tiotropium (SPIRIVA) 18 MCG inhalation capsule Place into inhaler and inhale. 03/23/16   [provider]  vitamin B-12 (CYANOCOBALAMIN) 1000 MCG tablet Take 1,000 mcg by mouth daily.    [provider]  Zinc 50 MG TABS Take 1 tablet by mouth daily.    [provider]    Allergies Patient has no known allergies.  Family History  Problem Relation Age of Onset  . Kidney Stones Father   . Kidney disease Neg Hx   . Prostate cancer Neg Hx   . Kidney cancer Neg Hx   . Bladder Cancer Neg Hx     Social History Social History   Tobacco Use  . Smoking status: Former Smoker    Packs/day: 0.00    Types: Cigarettes  . Smokeless tobacco: Former Systems developer  . Tobacco comment: quit 1992  Vaping Use  . Vaping Use: Never used  Substance Use Topics  . Alcohol use: Not Currently    Alcohol/week: 14.0 standard drinks    Types: 14 Standard drinks or equivalent per week  . Drug use: Yes    Types: Marijuana    Review of Systems  Constitutional: No fever/chills Eyes: No visual changes. ENT: No sore throat. Cardiovascular: Denies chest pain. Respiratory: Denies shortness of breath. Gastrointestinal: No abdominal pain.  No nausea, no vomiting.  No diarrhea.  No constipation. Genitourinary: Negative for dysuria. Musculoskeletal: Negative for back pain. Skin: Negative for rash. Neurological: Negative for headaches, positive for speech difficulties and left arm weakness.  ____________________________________________   PHYSICAL EXAM:  VITAL SIGNS: ED Triage Vitals  Enc Vitals Group     BP 06/02/20 1952 (!) 132/94     Pulse Rate 06/02/20 1952 (!) 121     Resp --      Temp 06/02/20 1952 98.4 F (36.9 C)     Temp src --      SpO2 06/02/20 1952 95 %     Weight 06/02/20 1950 262 lb (118.8 kg)     Height 06/02/20 1950 6\' 2"  (1.88 m)     Head Circumference --      Peak Flow --       Pain Score --      Pain Loc --      Pain Edu? --      Excl. in [place]? --     Constitutional: Alert and oriented. Eyes: Conjunctivae are normal. Head: Atraumatic. Nose: No congestion/rhinnorhea. Mouth/Throat: Mucous membranes are moist. Neck: Normal ROM Cardiovascular: Tachycardic, irregularly irregular rhythm. Grossly normal heart sounds. Respiratory: Normal respiratory effort.  No retractions. Lungs CTAB. Gastrointestinal: Soft and nontender. No distention. Genitourinary: deferred Musculoskeletal: No lower extremity tenderness nor edema. Neurologic: Slurred speech noted.  Left-sided  facial droop and 4 out of 5 strength noted in left upper extremity with pronator drift, 5 out of 5 strength in left lower extremity and right upper and lower extremities.. Skin:  Skin is warm, dry and intact. No rash noted. Psychiatric: Mood and affect are normal. Speech and behavior are normal.  ____________________________________________   LABS (all labs ordered are listed, but only abnormal results are displayed)  Labs Reviewed  COMPREHENSIVE METABOLIC PANEL - Abnormal; Notable for the following components:      Result Value   Potassium 3.3 (*)    Glucose, Bld 142 (*)    Creatinine, Ser 1.47 (*)    GFR, Estimated 50 (*)    All other components within normal limits  SARS CORONAVIRUS 2 BY RT PCR (HOSPITAL ORDER, [place] [facility])  PROTIME-INR  APTT  CBC  DIFFERENTIAL  ETHANOL  URINALYSIS, COMPLETE (UACMP) WITH MICROSCOPIC  MAGNESIUM  TSH  CBG MONITORING, ED  TROPONIN I (HIGH SENSITIVITY)   ____________________________________________  EKG  ED ECG REPORT I, Blake Divine, the attending physician, personally viewed and interpreted this ECG.   Date: 06/02/2020  EKG Time: 20:11  Rate: 133  Rhythm: atrial fibrillation, rate 133  Axis: Normal  Intervals:none  ST&T Change: Lateral T wave inversions   PROCEDURES  Procedure(s) performed (including Critical  Care):  .Critical Care Performed by: Blake Divine, MD Authorized by: Blake Divine, MD   Critical care provider statement:    Critical care time (minutes):  45   Critical care time was exclusive of:  Separately billable procedures and treating other patients and teaching time   Critical care was necessary to treat or prevent imminent or life-threatening deterioration of the following conditions:  CNS failure or compromise   Critical care was time spent personally by me on the following activities:  Discussions with consultants, evaluation of patient's response to treatment, examination of patient, ordering and performing treatments and interventions, ordering and review of laboratory studies, ordering and review of radiographic studies, pulse oximetry, re-evaluation of patient's condition, obtaining history from patient or surrogate and review of old charts   I assumed direction of critical care for this patient from another provider in my specialty: no     Care discussed with: admitting provider       ____________________________________________   INITIAL IMPRESSION / ASSESSMENT AND PLAN / ED COURSE       75 year old male with past medical history of hypertension, hyperlipidemia, diabetes, and COPD who presents to the ED with sudden onset left facial droop, speech difficulties, and left arm weakness about 1 hour prior to arrival.  On my assessment, patient does demonstrate left-sided facial droop with slurred speech and weakness in his left upper extremity.  He is also noted to be in atrial fibrillation, which he does not have a history of, raising concern for stroke.  CT head performed and is negative for acute process.  Patient currently being evaluated by neurology but he would be a candidate for CTA given he is VAN positive.   CT head is negative for acute process, following evaluation by neurology, decision was made to proceed with treatment with TPA.  CTA was performed and  negative for large vessel occlusion.  Patient's heart rate remains improved following IV metoprolol for his rapid atrial fibrillation.  Lab work is unremarkable, case discussed with ICU provider for admission.      ____________________________________________   FINAL CLINICAL IMPRESSION(S) / ED DIAGNOSES  Final diagnoses:  Cerebrovascular accident (CVA), unspecified  mechanism [facility])     ED Discharge Orders    None       Note:  This document was prepared using Dragon voice recognition software and may include unintentional dictation errors.   Blake Divine, MD 06/02/20 2141

## 2020-06-02 NOTE — ED Notes (Signed)
Pt to ct scan with rn.  

## 2020-06-03 ENCOUNTER — Inpatient Hospital Stay: Payer: Medicare Other

## 2020-06-03 ENCOUNTER — Inpatient Hospital Stay (HOSPITAL_COMMUNITY)
Admit: 2020-06-03 | Discharge: 2020-06-03 | Disposition: A | Discharge status: Home / Self Care | Payer: Medicare Other | Attending: Pulmonary Disease | Admitting: Pulmonary Disease

## 2020-06-03 DIAGNOSIS — I639 Cerebral infarction, unspecified: Secondary | ICD-10-CM | POA: Diagnosis not present

## 2020-06-03 DIAGNOSIS — I428 Other cardiomyopathies: Secondary | ICD-10-CM

## 2020-06-03 LAB — LIPID PANEL
Cholesterol: 144 mg/dL (ref 0–200)
HDL: 42 mg/dL (ref >40)
LDL Cholesterol: 87 mg/dL (ref 0–99)
Total CHOL/HDL Ratio: 3.4 RATIO
Triglycerides: 76 mg/dL (ref <150)
VLDL: 15 mg/dL (ref 0–40)

## 2020-06-03 LAB — PHOSPHORUS
Phosphorus: 3.3 mg/dL (ref 2.5–4.6)
Phosphorus: 4.3 mg/dL (ref 2.5–4.6)

## 2020-06-03 LAB — GLUCOSE, CAPILLARY
Glucose-Capillary: 111 mg/dL — ABNORMAL HIGH (ref 70–99)
Glucose-Capillary: 112 mg/dL — ABNORMAL HIGH (ref 70–99)
Glucose-Capillary: 114 mg/dL — ABNORMAL HIGH (ref 70–99)
Glucose-Capillary: 117 mg/dL — ABNORMAL HIGH (ref 70–99)
Glucose-Capillary: 130 mg/dL — ABNORMAL HIGH (ref 70–99)
Glucose-Capillary: 147 mg/dL — ABNORMAL HIGH (ref 70–99)
Glucose-Capillary: 196 mg/dL — ABNORMAL HIGH (ref 70–99)

## 2020-06-03 LAB — ECHOCARDIOGRAM COMPLETE
AR max vel: 2.28 cm2
AV Area VTI: 2.27 cm2
AV Area mean vel: 1.99 cm2
AV Mean grad: 3 mmHg
AV Peak grad: 4 mmHg
Ao pk vel: 1 m/s
Area-P 1/2: 4.93 cm2
Calc EF: 23.3 %
Height: 74 in
S' Lateral: 4.19 cm
Single Plane A2C EF: 14.6 %
Single Plane A4C EF: 26.7 %
Weight: 4192 oz

## 2020-06-03 LAB — BASIC METABOLIC PANEL
Anion gap: 9 (ref 5–15)
BUN: 26 mg/dL — ABNORMAL HIGH (ref 8–23)
CO2: 28 mmol/L (ref 22–32)
Calcium: 9 mg/dL (ref 8.9–10.3)
Chloride: 106 mmol/L (ref 98–111)
Creatinine, Ser: 1.21 mg/dL (ref 0.61–1.24)
GFR, Estimated: >60 mL/min (ref >60)
Glucose, Bld: 100 mg/dL — ABNORMAL HIGH (ref 70–99)
Potassium: 3.8 mmol/L (ref 3.5–5.1)
Sodium: 143 mmol/L (ref 135–145)

## 2020-06-03 LAB — CBC
HCT: 46.7 % (ref 39.0–52.0)
Hemoglobin: 16.3 g/dL (ref 13.0–17.0)
MCH: 28.8 pg (ref 26.0–34.0)
MCHC: 34.9 g/dL (ref 30.0–36.0)
MCV: 82.7 fL (ref 80.0–100.0)
Platelets: 234 10*3/uL (ref 150–400)
RBC: 5.65 MIL/uL (ref 4.22–5.81)
RDW: 13.4 % (ref 11.5–15.5)
WBC: 10.2 10*3/uL (ref 4.0–10.5)
nRBC: 0 % (ref 0.0–0.2)

## 2020-06-03 LAB — TROPONIN I (HIGH SENSITIVITY)
Troponin I (High Sensitivity): 305 ng/L (ref <18)
Troponin I (High Sensitivity): 454 ng/L (ref <18)
Troponin I (High Sensitivity): 537 ng/L (ref <18)

## 2020-06-03 LAB — MAGNESIUM: Magnesium: 2.3 mg/dL (ref 1.7–2.4)

## 2020-06-03 LAB — HEMOGLOBIN A1C
Hgb A1c MFr Bld: 7.4 % — ABNORMAL HIGH (ref 4.8–5.6)
Mean Plasma Glucose: 165.68 mg/dL

## 2020-06-03 MED ORDER — TIOTROPIUM BROMIDE MONOHYDRATE 18 MCG IN CAPS
18.0000 ug | ORAL_CAPSULE | Freq: Every day | RESPIRATORY_TRACT | Status: DC
  Administered 2020-06-03 – 2020-06-06 (×4): 18 ug via RESPIRATORY_TRACT
  Filled 2020-06-03 (×2): qty 5

## 2020-06-03 MED ORDER — HYDROCHLOROTHIAZIDE 25 MG PO TABS
25.0000 mg | ORAL_TABLET | Freq: Every day | ORAL | Status: DC
  Administered 2020-06-03 – 2020-06-05 (×3): 25 mg via ORAL
  Filled 2020-06-03 (×3): qty 1

## 2020-06-03 MED ORDER — ZINC 50 MG PO TABS: 1.0000 | ORAL_TABLET | Freq: Every day | ORAL | Status: DC

## 2020-06-03 MED ORDER — INSULIN ASPART 100 UNIT/ML ~~LOC~~ SOLN
0.0000 [IU] | SUBCUTANEOUS | Status: DC
  Administered 2020-06-03 (×2): 2 [IU] via SUBCUTANEOUS
  Administered 2020-06-03: 3 [IU] via SUBCUTANEOUS
  Administered 2020-06-04: 2 [IU] via SUBCUTANEOUS
  Administered 2020-06-04: 3 [IU] via SUBCUTANEOUS
  Administered 2020-06-04: 2 [IU] via SUBCUTANEOUS
  Administered 2020-06-05 (×2): 3 [IU] via SUBCUTANEOUS
  Filled 2020-06-03 (×8): qty 1

## 2020-06-03 MED ORDER — ASPIRIN EC 81 MG PO TBEC
81.0000 mg | DELAYED_RELEASE_TABLET | Freq: Every day | ORAL | Status: DC
  Administered 2020-06-03: 81 mg via ORAL
  Filled 2020-06-03: qty 1

## 2020-06-03 MED ORDER — L-LYSINE 500 MG PO TABS: 1.0000 | ORAL_TABLET | Freq: Every day | ORAL | Status: DC

## 2020-06-03 MED ORDER — NIACIN 500 MG PO TABS
500.0000 mg | ORAL_TABLET | Freq: Every day | ORAL | Status: DC
  Administered 2020-06-03 – 2020-06-06 (×4): 500 mg via ORAL
  Filled 2020-06-03 (×4): qty 1

## 2020-06-03 MED ORDER — ZINC SULFATE 220 (50 ZN) MG PO CAPS
220.0000 mg | ORAL_CAPSULE | Freq: Every day | ORAL | Status: DC
  Administered 2020-06-03 – 2020-06-06 (×4): 220 mg via ORAL
  Filled 2020-06-03 (×4): qty 1

## 2020-06-03 MED ORDER — HYDRALAZINE HCL 20 MG/ML IJ SOLN: 10.0000 mg | Freq: Four times a day (QID) | INTRAMUSCULAR | Status: DC | PRN

## 2020-06-03 MED ORDER — DILTIAZEM LOAD VIA INFUSION
10.0000 mg | Freq: Once | INTRAVENOUS | Status: AC
  Administered 2020-06-03: 10 mg via INTRAVENOUS
  Filled 2020-06-03: qty 10

## 2020-06-03 MED ORDER — VITAMIN B-12 1000 MCG PO TABS
1000.0000 ug | ORAL_TABLET | Freq: Every day | ORAL | Status: DC
  Administered 2020-06-03 – 2020-06-06 (×4): 1000 ug via ORAL
  Filled 2020-06-03 (×5): qty 1

## 2020-06-03 MED ORDER — DAPAGLIFLOZIN PROPANEDIOL 5 MG PO TABS
10.0000 mg | ORAL_TABLET | Freq: Every day | ORAL | Status: DC
  Administered 2020-06-03 – 2020-06-06 (×4): 10 mg via ORAL
  Filled 2020-06-03 (×4): qty 2

## 2020-06-03 MED ORDER — DILTIAZEM HCL-DEXTROSE 125-5 MG/125ML-% IV SOLN (PREMIX)
5.0000 mg/h | INTRAVENOUS | Status: DC
  Administered 2020-06-03: 5 mg/h via INTRAVENOUS
  Filled 2020-06-03 (×2): qty 125

## 2020-06-03 MED ORDER — CHLORHEXIDINE GLUCONATE CLOTH 2 % EX PADS
6.0000 | MEDICATED_PAD | Freq: Every day | CUTANEOUS | Status: DC
  Administered 2020-06-03 – 2020-06-06 (×4): 6 via TOPICAL

## 2020-06-03 MED ORDER — ALBUTEROL SULFATE HFA 108 (90 BASE) MCG/ACT IN AERS
2.0000 | INHALATION_SPRAY | Freq: Four times a day (QID) | RESPIRATORY_TRACT | Status: DC | PRN
  Administered 2020-06-03: 2 via RESPIRATORY_TRACT
  Filled 2020-06-03: qty 6.7

## 2020-06-03 MED ORDER — LOSARTAN POTASSIUM 50 MG PO TABS
100.0000 mg | ORAL_TABLET | Freq: Every day | ORAL | Status: DC
  Administered 2020-06-03 – 2020-06-05 (×3): 100 mg via ORAL
  Filled 2020-06-03 (×3): qty 2

## 2020-06-03 MED ORDER — INSULIN GLARGINE-LIXISENATIDE 100-33 UNT-MCG/ML ~~LOC~~ SOPN: 60.0000 [IU] | PEN_INJECTOR | Freq: Every morning | SUBCUTANEOUS | Status: DC

## 2020-06-03 MED ORDER — POTASSIUM CHLORIDE 10 MEQ/100ML IV SOLN
10.0000 meq | INTRAVENOUS | Status: AC
  Administered 2020-06-03 (×4): 10 meq via INTRAVENOUS
  Filled 2020-06-03 (×6): qty 100

## 2020-06-03 MED ORDER — METOPROLOL TARTRATE 5 MG/5ML IV SOLN
INTRAVENOUS | Status: AC
  Administered 2020-06-03: 5 mg via INTRAVENOUS
  Filled 2020-06-03: qty 5

## 2020-06-03 MED ORDER — MAGNESIUM OXIDE 400 (241.3 MG) MG PO TABS
400.0000 mg | ORAL_TABLET | Freq: Every day | ORAL | Status: DC
  Administered 2020-06-03 – 2020-06-06 (×4): 400 mg via ORAL
  Filled 2020-06-03 (×5): qty 1

## 2020-06-03 MED ORDER — METOPROLOL TARTRATE 5 MG/5ML IV SOLN
5.0000 mg | Freq: Four times a day (QID) | INTRAVENOUS | Status: DC | PRN
  Administered 2020-06-04: 5 mg via INTRAVENOUS
  Filled 2020-06-03: qty 5

## 2020-06-03 MED ORDER — FENOFIBRATE 160 MG PO TABS
160.0000 mg | ORAL_TABLET | Freq: Every day | ORAL | Status: DC
  Administered 2020-06-03 – 2020-06-06 (×4): 160 mg via ORAL
  Filled 2020-06-03 (×4): qty 1

## 2020-06-03 MED ORDER — LOSARTAN POTASSIUM-HCTZ 100-25 MG PO TABS: 1.0000 | ORAL_TABLET | Freq: Every day | ORAL | Status: DC

## 2020-06-03 MED ORDER — ROPINIROLE HCL 1 MG PO TABS
1.5000 mg | ORAL_TABLET | Freq: Every day | ORAL | Status: DC
  Administered 2020-06-03 – 2020-06-05 (×4): 1.5 mg via ORAL
  Filled 2020-06-03 (×6): qty 2

## 2020-06-03 MED ORDER — SAW PALMETTO 450 MG PO CAPS: 1.0000 | ORAL_CAPSULE | Freq: Every day | ORAL | Status: DC

## 2020-06-03 MED ORDER — ATORVASTATIN CALCIUM 20 MG PO TABS
80.0000 mg | ORAL_TABLET | Freq: Every day | ORAL | Status: DC
  Administered 2020-06-03 – 2020-06-06 (×4): 80 mg via ORAL
  Filled 2020-06-03 (×5): qty 4

## 2020-06-03 MED ORDER — INSULIN GLARGINE 100 UNIT/ML ~~LOC~~ SOLN
60.0000 [IU] | Freq: Every day | SUBCUTANEOUS | Status: DC
  Administered 2020-06-03 – 2020-06-06 (×4): 60 [IU] via SUBCUTANEOUS
  Filled 2020-06-03 (×4): qty 0.6

## 2020-06-03 MED ORDER — GLIPIZIDE ER 10 MG PO TB24: 10.0000 mg | ORAL_TABLET | Freq: Every day | ORAL | Status: DC

## 2020-06-03 MED ORDER — PAROXETINE HCL 20 MG PO TABS
20.0000 mg | ORAL_TABLET | Freq: Every day | ORAL | Status: DC
  Administered 2020-06-03 – 2020-06-06 (×4): 20 mg via ORAL
  Filled 2020-06-03 (×4): qty 1

## 2020-06-03 MED ORDER — MAGNESIUM 500 MG PO TABS: 1.0000 | ORAL_TABLET | Freq: Every day | ORAL | Status: DC

## 2020-06-03 MED ORDER — GLYBURIDE-METFORMIN 2.5-500 MG PO TABS: 2.0000 | ORAL_TABLET | Freq: Every day | ORAL | Status: DC

## 2020-06-03 NOTE — Progress Notes (Signed)
2100 modified NIH not done, pt was in MRI.

## 2020-06-03 NOTE — Progress Notes (Signed)
PT Cancellation Note  Patient Details Name: Paul Goodman MRN: [id number] DOB: [birth year]   Cancelled Treatment:    Reason Eval/Treat Not Completed: Medical issues which prohibited therapy. Patient s/p Tpa and has MRI pending. Will hold until appropriate.    Rayansh Herbst 06/03/2020, 9:14 AM

## 2020-06-03 NOTE — Progress Notes (Signed)
Received patient tonight from the ED as a code stroke.   Neuro: stable neuro assessment with improving symptoms Resp: place don 2L [place] for decreased O2 saturations and increased work of breathing CV: afberile, warm and well perfused GIGU: NPO, voiding in urinal, no BM Skin: clean, dry, and intact Social: Patient is in good spirits, very talkative and interactive. All questions and concerns addressed.  NIH stroke assessments continued Q 30 minutes throughout night to morning. Improved, almost resolved symptoms present.   Patient is requesting his medicine for his restless leg syndrome but he remains NPO until speech can perform a swallow eval.

## 2020-06-03 NOTE — Progress Notes (Signed)
OT Cancellation Note  Patient Details Name: Paul Goodman MRN: [id number] DOB: 12/23/1945   Cancelled Treatment:    Reason Eval/Treat Not Completed: Medical issues which prohibited therapy Consult received and chart reviewed.  Patient admitted to ICU for CVA work up with tPA infusion (started at 2036, 1/27).  Per guidelines, to be on strict bedrest x24 hours post infusion, pending follow up MRI this PM. Will continue to follow and initiate as medically appropriate.  Dessie Coma, M.S. OTR/L  06/03/20, 8:52 AM  ascom [phone]

## 2020-06-03 NOTE — Progress Notes (Signed)
*  PRELIMINARY RESULTS* Echocardiogram 2D Echocardiogram has been performed.  Paul Goodman 06/03/2020, 1:40 PM

## 2020-06-03 NOTE — Consult Note (Signed)
Neurology Consultation  Reason for Consult: stroke post TPA Referring Physician: Dr Merrilee Jansky  CC: left sided weakness, headache  History is obtained from:patient and chart  HPI: Paul Goodman is a 75 y.o. male admitted yesterday to the ICU after receiving TPA via telemedicine neurology for left-sided weakness, left facial droop. Past medical history of COPD, diabetes, hyperlipidemia, hypertension and sleep apnea. Is a 75 year old man, came in for sudden onset of left face and arm weakness along with slurred speech and right-sided headache.  Was seen in the emergency department at [facility], and given that he was within 4-1/2 hours from symptom onset, code stroke was activated.  He was seen by telemedicine neurology, they evaluated him, NIH stroke scale was 4 as documented in their initial consultation.  Recent benefits with IV TPA were discussed and he was given IV TPA at 8:36 PM on 06/02/2020. He reports no new symptoms since then.  Reports improvement in the left arm strength.  Said he could barely move the arm yesterday but today feels most of the strength is back but it still feels clumsy. Denies any facial asymmetry but on my examination it did look like had a very slight left lower facial droop. Denies a headache at this time. Last known well was 7 PM on 06/02/2020. TPA needle time was 8:36 PM on 06/02/2020.  He was admitted to the ICU for post TPA care at [facility]. While in the ED, he was found to be in A. fib with RVR-has no history of A. fib with RVR.   LKW: 1900 hrs. on 06/02/2020 tpa given?:  Yes by telemedicine neurology Premorbid modified Rankin scale (mRS): 0  ROS: Obtained and negative except as noted in HPI  Past Medical History:  Diagnosis Date  . Anxiety   . Arthritis   . COPD (chronic obstructive pulmonary disease) ([place])   . Diabetes ([place])   . ED (erectile dysfunction)   . Heart murmur   . Hyperlipidemia   . Hypertension   .  Melanoma ([place])   . Sleep apnea     Family History  Problem Relation Age of Onset  . Kidney Stones Father   . Kidney disease Neg Hx   . Prostate cancer Neg Hx   . Kidney cancer Neg Hx   . Bladder Cancer Neg Hx      Social History:   reports that he has quit smoking. His smoking use included cigarettes. He smoked 0.00 packs per day. He has quit using smokeless tobacco. He reports previous alcohol use of about 14.0 standard drinks of alcohol per week. He reports current drug use. Drug: Marijuana.  Medications  Current Facility-Administered Medications:  .   stroke: mapping our early stages of recovery book, , Does not apply, Once, Rust-Chester, Toribio Harbour L, NP .  acetaminophen (TYLENOL) tablet 650 mg, 650 mg, Oral, Q4H PRN **OR** acetaminophen (TYLENOL) 160 MG/5ML solution 650 mg, 650 mg, Per Tube, Q4H PRN **OR** acetaminophen (TYLENOL) suppository 650 mg, 650 mg, Rectal, Q4H PRN, Rust-Chester, Britton L, NP .  albuterol (VENTOLIN HFA) 108 (90 Base) MCG/ACT inhaler 2 puff, 2 puff, Inhalation, Q6H PRN, Rust-Chester, Britton L, NP .  hydrALAZINE (APRESOLINE) injection 10 mg, 10 mg, Intravenous, Q6H PRN, Rust-Chester, Britton L, NP .  insulin aspart (novoLOG) injection 0-15 Units, 0-15 Units, Subcutaneous, Q4H, Rust-Chester, Britton L, NP, 2 Units at 06/03/20 0802 .  LORazepam (ATIVAN) injection 0.5 mg, 0.5 mg, Intravenous, Q6H PRN, Rust-Chester, Britton L, NP .  metoprolol tartrate (  LOPRESSOR) injection 5 mg, 5 mg, Intravenous, Q6H PRN, Rust-Chester, Britton L, NP, 5 mg at 06/03/20 0245 .  pantoprazole (PROTONIX) injection 40 mg, 40 mg, Intravenous, QHS, Rust-Chester, Britton L, NP, 40 mg at 06/02/20 2243 .  senna-docusate (Senokot-S) tablet 1 tablet, 1 tablet, Oral, QHS PRN, Rust-Chester, Huel Cote, NP .  tiotropium (SPIRIVA) inhalation capsule ([facility] use ONLY) 18 mcg, 18 mcg, Inhalation, Daily, Rust-Chester, Britton L, NP, 18 mcg at 06/03/20 0803   Exam: Current vital signs: BP 100/86    Pulse 100   Temp (!) 97.5 F (36.4 C) (Oral)   Resp 20   Ht 6\' 2"  (1.88 m)   Wt 118.8 kg   SpO2 95%   BMI 33.64 kg/m  Vital signs in last 24 hours: Temp:  [97.5 F (36.4 C)-99.1 F (37.3 C)] 97.5 F (36.4 C) (01/28 0800) Pulse Rate:  [25-149] 100 (01/28 0800) Resp:  [12-117] 20 (01/28 0800) BP: (100-160)/(69-140) 100/86 (01/28 0800) SpO2:  [20 %-100 %] 95 % (01/28 0800) Weight:  [id number] kg] 118.8 kg (01/27 1950) GENERAL: Awake, alert in NAD HEENT: - Normocephalic and atraumatic, dry mm, no LN++, no Thyromegally LUNGS -scattered wheezing CV - S1S2 RRR, no m/r/g, equal pulses bilaterally. ABDOMEN - Soft, nontender, nondistended with normoactive BS Ext: warm, well perfused, intact peripheral pulses, no edema  NEURO:  Mental Status: AA&Ox3  Language: speech is mildly dysarthric.  Naming, repetition, fluency, and comprehension intact. Cranial Nerves: PERRL. EOMI, visual fields full, very subtle left lower face droopingat rest and when smiling, facial sensation intact, hearing intact, tongue/uvula/soft palate midline, normal sternocleidomastoid and trapezius muscle strength. No evidence of tongue atrophy or fibrillations Motor: No vertical drift in any of the 4 extremities.  5/5 strength. Tone: is normal and bulk is normal Sensation- Intact to light touch bilaterally Coordination: Mild dysmetria on left finger-nose-finger testing. Gait- deferred  NIHSS-2 (1 each for dysmetria and dysarthria)   Labs I have reviewed labs in epic and the results pertinent to this consultation are:  CBC    Component Value Date/Time   WBC 10.2 06/03/2020 0521   RBC 5.65 06/03/2020 0521   HGB 16.3 06/03/2020 0521   HGB 13.4 04/17/2012 2023   HCT 46.7 06/03/2020 0521   HCT 38.7 (L) 04/17/2012 2023   PLT 234 06/03/2020 0521   PLT 205 04/17/2012 2023   MCV 82.7 06/03/2020 0521   MCV 86 04/17/2012 2023   MCH 28.8 06/03/2020 0521   MCHC 34.9 06/03/2020 0521   RDW 13.4 06/03/2020 0521   RDW  14.0 04/17/2012 2023   LYMPHSABS 2.4 06/02/2020 2010   MONOABS 0.8 06/02/2020 2010   EOSABS 0.1 06/02/2020 2010   BASOSABS 0.0 06/02/2020 2010    CMP     Component Value Date/Time   NA 143 06/03/2020 0521   NA 139 04/17/2012 2023   K 3.8 06/03/2020 0521   K 3.8 04/17/2012 2023   CL 106 06/03/2020 0521   CL 105 04/17/2012 2023   CO2 28 06/03/2020 0521   CO2 28 04/17/2012 2023   GLUCOSE 100 (H) 06/03/2020 0521   GLUCOSE 126 (H) 04/17/2012 2023   BUN 26 (H) 06/03/2020 0521   BUN 15 04/17/2012 2023   CREATININE 1.21 06/03/2020 0521   CREATININE 0.77 04/17/2012 2023   CALCIUM 9.0 06/03/2020 0521   CALCIUM 9.0 04/17/2012 2023   PROT 7.4 06/02/2020 2010   PROT 7.8 04/17/2012 2023   ALBUMIN 3.9 06/02/2020 2010   ALBUMIN 4.1 04/17/2012 2023   AST 19  06/02/2020 2010   AST 25 04/17/2012 2023   ALT 19 06/02/2020 2010   ALT 46 04/17/2012 2023   ALKPHOS 65 06/02/2020 2010   ALKPHOS 113 04/17/2012 2023   BILITOT 0.5 06/02/2020 2010   BILITOT 0.5 04/17/2012 2023   GFRNONAA >60 06/03/2020 0521   GFRNONAA >60 04/17/2012 2023   GFRAA >60 02/26/2017 1400   GFRAA >60 04/17/2012 2023    Lipid Panel     Component Value Date/Time   CHOL 144 06/03/2020 0521   TRIG 76 06/03/2020 0521   HDL 42 06/03/2020 0521   CHOLHDL 3.4 06/03/2020 0521   VLDL 15 06/03/2020 0521   LDLCALC 87 06/03/2020 0521   LDL 87 Hemoglobin A1c pending at this time  Imaging I have reviewed the images obtained:  CT-scan of the brain-no acute changes.  Aspects not read on the official radiology read-appears to be 10.  Diffusely enlarged ventricles and subarachnoid spaces.  No bleed. CTA head and neck-no emergent LVO.  Mild proximal right subclavian narrowing.  No emergent vascular finding within the head.  Moderate right and mild left cavernous ICA narrowing.  MRI examination of the brain-pending-ordered for 24-hour post TPA.  Assessment:  75 year old man with above past medical history new onset A. fib with  a sudden onset of right-sided headache and left-sided arm face and leg weakness and slurred speech brought in for emergent evaluation. Seen by telemedicine neurology and concern for right hemispheric stroke for which IV TPA was offered, risk benefits discussed and patient agreed to proceed. IV TPA was given last night. Continues to do well.  No headaches.  No further symptoms. Actually reports improvement in symptoms since yesterday on the left side. Today on my exam, has mild dysarthria and left-sided dysmetria Likely cardioembolic stroke.   Impression: Likely acute ischemic stroke status post IV thrombolysis New onset A. Fib Diabetes, hypertension, hyperlipidemia, COPD, OSA  Recommendations: LDL 87-atorvastatin 80 for goal LDL less than 70 Continue frequent neurochecks per TPA protocol Continue telemetry 2D echo-pending Management of A. fib with RVR per primary team as you are Systolic blood pressure less than 180 per TPA protocol 24-hour MRI ordered A1c pending-goal A1c should be less than 7 PT OT speech therapy Management of COPD, and OSA per primary team as you are. No antiplatelets or anticoagulants unless 24-hour post TPA imaging of the head is negative for bleed. We will follow up on the 24-hour post TPA imaging-aspirin can be started if negative for bleeding at that time. Prior to discharge, will need anticoagulation with Eliquis.  Timing of anticoagulation initiation will depend on the size of the stroke. SCDs for DVT prophylaxis Gentle hydration  -- Amie Portland, MD Neurologist [facility] Pager: [phone]  CRITICAL CARE ATTESTATION Performed by: Amie Portland, MD Total critical care time: 40 minutes Critical care time was exclusive of separately billable procedures and treating other patients and/or supervising APPs/Residents/Students Critical care was necessary to treat or prevent imminent or life-threatening deterioration due to acute ischemic  stroke status post thrombolysis This patient is critically ill and at significant risk for neurological worsening and/or death and care requires constant monitoring. Critical care was time spent personally by me on the following activities: development of treatment plan with patient and/or surrogate as well as nursing, discussions with consultants, evaluation of patient's response to treatment, examination of patient, obtaining history from patient or surrogate, ordering and performing treatments and interventions, ordering and review of laboratory studies, ordering and review of radiographic studies, pulse oximetry, re-evaluation of  patient's condition, participation in multidisciplinary rounds and medical decision making of high complexity in the care of this patient.

## 2020-06-03 NOTE — Evaluation (Signed)
Clinical/Bedside Swallow Evaluation Patient Details  Name: Paul Goodman MRN: [id number] Date of Birth: Jul 25, 1945  Today's Date: 06/03/2020 Time: SLP Start Time (ACUTE ONLY): 23 SLP Stop Time (ACUTE ONLY): 0730 SLP Time Calculation (min) (ACUTE ONLY): 20 min  Past Medical History:  Past Medical History:  Diagnosis Date  . Anxiety   . Arthritis   . COPD (chronic obstructive pulmonary disease) ([place])   . Diabetes ([place])   . ED (erectile dysfunction)   . Heart murmur   . Hyperlipidemia   . Hypertension   . Melanoma ([place])   . Sleep apnea    Past Surgical History:  Past Surgical History:  Procedure Laterality Date  . EYE SURGERY Right    cancer  . HAND SURGERY Left 1966  . KNEE SURGERY Left 1973  . TONSILLECTOMY  1964   HPI:  Paul Goodman is a 75 y.o. male with past medical history of hypertension, hyperlipidemia, diabetes, and COPD who presented to the ED for possible stroke on 06/02/20. Pt received tPA. Head CT revealed chronic ischemic microangiopathy without acute intracranial abnormality with ASPECTS of 10. CT perfusion was negative for infarct but there were regions of slightly delayed perfusion involving the  bilateral watershed territories.   Assessment / Plan / Recommendation Clinical Impression  Pt presents with grossly adequate oropharyngeal abilities when consuming thin liquids via straw, puree and regular solids. Pt's oral phase is timely, with good mastication of solids and complete oral clearing. Pt's pharyngeal phase is free of overt s/s of aspiration with the appearance of a swift swallow. At this time, pt is appropriate for an age-appropriate regular diet with thin liquids via straw, medicine whole with thin liquids. Pt's speech, language and cognition were functional throughout this evaluation. However, should cognitive deficits become more prevalent, please re-consult for a cognitive linguistic evaluation. Pt may also request Outpatient services from PCP,  should any deficits become prevalent within his home environment. SLP Visit Diagnosis: Dysphagia, unspecified (R13.10)    Aspiration Risk  Mild aspiration risk    Diet Recommendation Regular;Thin liquid   Liquid Administration via: Straw Medication Administration: Whole meds with liquid Supervision: Patient able to self feed Compensations: Minimize environmental distractions;Slow rate;Small sips/bites Postural Changes: Seated upright at 90 degrees    Other  Recommendations Oral Care Recommendations: Oral care BID   Follow up Recommendations None        Swallow Study   General Date of Onset: 06/03/20 HPI: Paul Goodman is a 75 y.o. male with past medical history of hypertension, hyperlipidemia, diabetes, and COPD who presented to the ED for possible stroke on 06/02/20. Pt received tPA. Head CT revealed chronic ischemic microangiopathy without acute intracranial abnormality with ASPECTS of 10. CT perfusion was negative for infarct but there were regions of slightly delayed perfusion involving the  bilateral watershed territories. Type of Study: Bedside Swallow Evaluation Previous Swallow Assessment: none in chart Diet Prior to this Study: NPO Temperature Spikes Noted: No Respiratory Status: Nasal cannula (2L) History of Recent Intubation: No Behavior/Cognition: Alert;Cooperative;Pleasant mood Oral Cavity Assessment: Within Functional Limits Oral Care Completed by SLP: Recent completion by staff Oral Cavity - Dentition: Adequate natural dentition Vision: Functional for self-feeding Self-Feeding Abilities: Able to feed self Patient Positioning: Upright in bed Baseline Vocal Quality: Normal Volitional Cough: Strong Volitional Swallow: Able to elicit    Oral/Motor/Sensory Function Overall Oral Motor/Sensory Function: Within functional limits   Ice Chips Ice chips: Not tested   Thin Liquid Thin Liquid: Within functional limits Presentation: Straw;Self  Fed    Nectar Thick  Nectar Thick Liquid: Not tested   Honey Thick Honey Thick Liquid: Not tested   Puree Puree: Within functional limits Presentation: Self Fed;Spoon   Solid     Solid: Within functional limits Presentation: Self Fed     Paul Goodman B. Rutherford Nail M.S., CCC-SLP, [facility] Office [phone]  Paul Goodman 06/03/2020,7:55 AM

## 2020-06-03 NOTE — Progress Notes (Signed)
NAME:  Paul Goodman, MRN:  [id number], DOB:  26-Jan-1946, LOS: 1 ADMISSION DATE:  06/02/2020, CONSULTATION DATE: 06/02/2020 REFERRING MD: Dr. Charna Archer, CHIEF COMPLAINT:   Numbness, weakness, aphasia  Brief History:  75 year old male presenting to the ED as a code stroke status post TPA administration admitted to the ICU for monitoring.  Overnight:  Stable and done well  Significant Hospital Events:  06/02/2020-code stroke initiated, tPA administered, admitted to ICU for monitoring 06/03/2020-passed swallow  Consults:  Neurology   Significant Diagnostic Tests:  06/02/2020 CT w/o contrast >> aspects= 10, no acute intracranial abnormality, chronic ischemic microangiopathic angiopathy 06/02/2020 CTa head & neck, with cerebral perfusion >> no evidence of infarct, regions of slightly delayed perfusion involving bilateral watershed territories Micro Data:  06/02/2020 COVID-19 >> negative   Interim History / Subjective:  Patient sitting up in stretcher alert and responsive.  He is very hard of hearing, and has to read lips intermittently.  Patient reports since tPA administration obtaining some feeling back in his left upper extremity, but is still having difficulty moving all of his fingers at well.  No complaints of pain.  Patient failed bedside swallow, speech eval ordered for 06/03/2020.  Wife bedside, both updated on plan of care all questions and concerns answered  Passed swallow eval today   Objective   Blood pressure 119/83, pulse (!) 122, temperature 97.8 F (36.6 C), temperature source Oral, resp. rate 18, height 6\' 2"  (1.88 m), weight 118.8 kg, SpO2 93 %.        Intake/Output Summary (Last 24 hours) at 06/03/2020 1828 Last data filed at 06/03/2020 1742 Gross per 24 hour  Intake 859.19 ml  Output 1575 ml  Net -715.81 ml   Filed Weights   06/02/20 1950  Weight: 118.8 kg    Examination: General: Adult male, critically/chronically ill, NAD HEENT: MM pink/moist, anicteric,  atraumatic, neck supple Neuro: *A&O x 4, able to follow commands, PERRL +3, MAE- weakness in L fingers CV: s1s2 Irregular in A-fib on monitor, no r/m/g Pulm: Regular, non labored on room air, breath sounds clear-BUL & diminished-BLL GI: soft, rounded, non tender, bs x 4 Skin: no rashes/lesions noted Extremities: warm/dry, pulses + 2 R/P, trace edema noted BLE  Resolved Hospital Problem list     Assessment & Plan:  Acute CVA New onset atrial fibrillation Patient reported weakness in the left upper extremity, inability to move fingers well, aphasia & coffee dripping out of mouth unintentionally as well as a right-sided headache.  Last known well 06/02/2020 at 1900.  Initial NIH SS score: 4, modified Rankin scale 0 points.  Telemetry neurology consulted after code stroke initiation in the ED and TPA was administered.  CTA negative for LVO. -Modified stroke scale every 15 minutes x2 hours, every 30 minutes x6 hours, every 1 hour x16 hours then per routine -Follow-up head CT 24 hours post TPA administration - Continuous cardiac monitoring -Labetalol every 2 as needed to maintain BP <180/105 & HR < 120 -Consider nicardipine drip if needed - Supplemental O2 to maintain SPO2 > 94% -Lipid panel & A1c ordered -Echocardiogram ordered -Daily BMP, replace electrolytes as needed  Appreciate PT/OT/ST evaluation Follow Neurology recommendations  Type 2 diabetes mellitus -A1c ordered -Every 4 CBG monitoring with SSI  Hypertension Hyperlipidemia -Lipid panel ordered for a.m. labs -Once patient has cleared speech eval, start statin coverage -Home blood pressure control regimen on hold until cleared by SLP eval: Amlodipine 5 mg daily, losartan-hydrochlorothiazide daily  COPD OSA-patient states he has a  CPAP ordered outpatient but does not wear it Currently on room air. -Supplemental O2 to maintain SPO2 > 94% per CVA guidelines -Continuous pulse oximetry monitoring -Bronchodilators as  needed -Continue home tiotropium  Anxiety Patient recently prescribed lorazepam 0.5-1 mg daily as needed.  Patient describes having episodes where he becomes anxious, feels as though his throat is closing and "passes out".  PCP ordered Ativan, patient states anecdotally that it has been helping. -Lorazepam as needed ordered IV until SLP clears the patient for safe swallowing  Best practice (evaluated daily)  Diet: N.p.o. Pain/Anxiety/Delirium protocol (if indicated): Home lorazepam ordered IV as needed VAP protocol (if indicated): N/A DVT prophylaxis: SCDs GI prophylaxis: Protonix Glucose control: Every 4 monitor with SSI Mobility: Strict bedrest for 24 hours Disposition: ICU  Goals of Care:  Case discussed in MD rounds today and with pharmacy  Code Status: Full  Labs   CBC: Recent Labs  Lab 06/02/20 2010 06/03/20 0521  WBC 10.0 10.2  NEUTROABS 6.6  --   HGB 16.0 16.3  HCT 46.9 46.7  MCV 83.5 82.7  PLT 213 016    Basic Metabolic Panel: Recent Labs  Lab 06/02/20 2010 06/02/20 2352 06/03/20 0521  NA 137  --  143  K 3.3*  --  3.8  CL 100  --  106  CO2 24  --  28  GLUCOSE 142*  --  100*  BUN 23  --  26*  CREATININE 1.47*  --  1.21  CALCIUM 9.3  --  9.0  MG 2.0  --  2.3  PHOS  --  4.3 3.3   GFR: Estimated Creatinine Clearance: 74.4 mL/min (by C-G formula based on SCr of 1.21 mg/dL). Recent Labs  Lab 06/02/20 2010 06/03/20 0521  WBC 10.0 10.2    Liver Function Tests: Recent Labs  Lab 06/02/20 2010  AST 19  ALT 19  ALKPHOS 65  BILITOT 0.5  PROT 7.4  ALBUMIN 3.9   No results for input(s): LIPASE, AMYLASE in the last 168 hours. No results for input(s): AMMONIA in the last 168 hours.  ABG No results found for: PHART, PCO2ART, PO2ART, HCO3, TCO2, ACIDBASEDEF, O2SAT   Coagulation Profile: Recent Labs  Lab 06/02/20 2010  INR 1.1    Cardiac Enzymes: No results for input(s): CKTOTAL, CKMB, CKMBINDEX, TROPONINI in the last 168  hours.  HbA1C: Hgb A1c MFr Bld  Date/Time Value Ref Range Status  06/03/2020 05:21 AM 7.4 (H) 4.8 - 5.6 % Final    Comment:    (NOTE) Pre diabetes:          5.7%-6.4%  Diabetes:              >6.4%  Glycemic control for   <7.0% adults with diabetes     CBG: Recent Labs  Lab 06/03/20 0020 06/03/20 0356 06/03/20 0723 06/03/20 1107 06/03/20 1538  GLUCAP 117* 112* 130* 196* 111*

## 2020-06-04 DIAGNOSIS — I5021 Acute systolic (congestive) heart failure: Secondary | ICD-10-CM

## 2020-06-04 DIAGNOSIS — I4891 Unspecified atrial fibrillation: Secondary | ICD-10-CM

## 2020-06-04 DIAGNOSIS — I639 Cerebral infarction, unspecified: Secondary | ICD-10-CM | POA: Diagnosis not present

## 2020-06-04 LAB — GLUCOSE, CAPILLARY
Glucose-Capillary: 93 mg/dL (ref 70–99)
Glucose-Capillary: 86 mg/dL (ref 70–99)
Glucose-Capillary: 161 mg/dL — ABNORMAL HIGH (ref 70–99)
Glucose-Capillary: 106 mg/dL — ABNORMAL HIGH (ref 70–99)
Glucose-Capillary: 138 mg/dL — ABNORMAL HIGH (ref 70–99)
Glucose-Capillary: 122 mg/dL — ABNORMAL HIGH (ref 70–99)

## 2020-06-04 LAB — BASIC METABOLIC PANEL
Anion gap: 8 (ref 5–15)
BUN: 22 mg/dL (ref 8–23)
CO2: 27 mmol/L (ref 22–32)
Calcium: 8.7 mg/dL — ABNORMAL LOW (ref 8.9–10.3)
Chloride: 105 mmol/L (ref 98–111)
Creatinine, Ser: 1.18 mg/dL (ref 0.61–1.24)
GFR, Estimated: >60 mL/min (ref >60)
Glucose, Bld: 85 mg/dL (ref 70–99)
Potassium: 3.3 mmol/L — ABNORMAL LOW (ref 3.5–5.1)
Sodium: 140 mmol/L (ref 135–145)

## 2020-06-04 LAB — PHOSPHORUS: Phosphorus: 2.9 mg/dL (ref 2.5–4.6)

## 2020-06-04 LAB — CBC
HCT: 44.6 % (ref 39.0–52.0)
Hemoglobin: 14.9 g/dL (ref 13.0–17.0)
MCH: 28.2 pg (ref 26.0–34.0)
MCHC: 33.4 g/dL (ref 30.0–36.0)
MCV: 84.3 fL (ref 80.0–100.0)
Platelets: 184 10*3/uL (ref 150–400)
RBC: 5.29 MIL/uL (ref 4.22–5.81)
RDW: 13.4 % (ref 11.5–15.5)
WBC: 8.7 10*3/uL (ref 4.0–10.5)
nRBC: 0 % (ref 0.0–0.2)

## 2020-06-04 LAB — MAGNESIUM: Magnesium: 2.2 mg/dL (ref 1.7–2.4)

## 2020-06-04 MED ORDER — CARVEDILOL 6.25 MG PO TABS
6.2500 mg | ORAL_TABLET | Freq: Two times a day (BID) | ORAL | Status: DC
  Administered 2020-06-04 – 2020-06-05 (×2): 6.25 mg via ORAL
  Filled 2020-06-04: qty 1
  Filled 2020-06-04: qty 2

## 2020-06-04 MED ORDER — POTASSIUM CHLORIDE CRYS ER 20 MEQ PO TBCR: 20.0000 meq | EXTENDED_RELEASE_TABLET | ORAL | Status: DC

## 2020-06-04 MED ORDER — DILTIAZEM HCL 30 MG PO TABS
60.0000 mg | ORAL_TABLET | Freq: Four times a day (QID) | ORAL | Status: DC
  Administered 2020-06-04: 60 mg via ORAL
  Filled 2020-06-04 (×2): qty 2

## 2020-06-04 MED ORDER — ASPIRIN EC 81 MG PO TBEC
81.0000 mg | DELAYED_RELEASE_TABLET | Freq: Every day | ORAL | Status: AC
  Administered 2020-06-04: 81 mg via ORAL
  Filled 2020-06-04: qty 1

## 2020-06-04 MED ORDER — AMIODARONE HCL 200 MG PO TABS
200.0000 mg | ORAL_TABLET | Freq: Two times a day (BID) | ORAL | Status: DC
  Administered 2020-06-04 – 2020-06-06 (×5): 200 mg via ORAL
  Filled 2020-06-04 (×5): qty 1

## 2020-06-04 MED ORDER — HYDROMORPHONE HCL 1 MG/ML IJ SOLN
INTRAMUSCULAR | Status: AC
  Administered 2020-06-04: 0.5 mg
  Filled 2020-06-04: qty 1

## 2020-06-04 MED ORDER — APIXABAN 5 MG PO TABS
5.0000 mg | ORAL_TABLET | Freq: Two times a day (BID) | ORAL | Status: DC
  Administered 2020-06-05 – 2020-06-06 (×3): 5 mg via ORAL
  Filled 2020-06-04 (×3): qty 1

## 2020-06-04 MED ORDER — POTASSIUM CHLORIDE 20 MEQ PO PACK
20.0000 meq | PACK | ORAL | Status: AC
  Administered 2020-06-04 (×4): 20 meq via ORAL
  Filled 2020-06-04 (×4): qty 1

## 2020-06-04 MED ORDER — POTASSIUM CHLORIDE 10 MEQ/100ML IV SOLN
10.0000 meq | INTRAVENOUS | Status: DC
  Administered 2020-06-04: 10 meq via INTRAVENOUS
  Filled 2020-06-04 (×4): qty 100

## 2020-06-04 NOTE — Consult Note (Signed)
ANTICOAGULATION CONSULT NOTE - Initial Consult  Pharmacy Consult for Eliquis Indication: atrial fibrillation  No Known Allergies  Patient Measurements: Height: 6\' 2"  (188 cm) Weight: 118.8 kg (262 lb) IBW/kg (Calculated) : 82.2   Vital Signs: Temp: 98.1 F (36.7 C) (01/29 0700) Temp Source: Oral (01/29 0400) BP: 110/63 (01/29 0700) Pulse Rate: 45 (01/29 0700)  Labs: Recent Labs    06/02/20 2010 06/02/20 2352 06/03/20 0141 06/03/20 0521 06/04/20 0406  HGB 16.0  --   --  16.3 14.9  HCT 46.9  --   --  46.7 44.6  PLT 213  --   --  234 184  APTT 29  --   --   --   --   LABPROT 13.7  --   --   --   --   INR 1.1  --   --   --   --   CREATININE 1.47*  --   --  1.21 1.18  TROPONINIHS 296* 537* 454* 305*  --     Estimated Creatinine Clearance: 76.3 mL/min (by C-G formula based on SCr of 1.18 mg/dL).   Medical History: Past Medical History:  Diagnosis Date  . Anxiety   . Arthritis   . COPD (chronic obstructive pulmonary disease) ([place])   . Diabetes ([place])   . ED (erectile dysfunction)   . Heart murmur   . Hyperlipidemia   . Hypertension   . Melanoma ([place])   . Sleep apnea     Medications:  No PTA anticoagulation of record - pt received TPA 2036 06/02/20 in ED.  Assessment: 75 y.o. male admitted to the ICU after receiving TPA via telemedicine neurology for left-sided weakness, left facial droop.  Past medical history of COPD, diabetes, hyperlipidemia, hypertension and sleep apnea.   Goal of Therapy:  Monitor platelets by anticoagulation protocol: Yes   Plan:  Will start Eliquis 5mg  bid for a fib 1/30 and last dose asa 81 to be given 1/29 per neuro  Will monitor CBC/Scr a minimum of every 3 days per protocol.  Lu Duffel, PharmD, BCPS Clinical Pharmacist 06/04/2020 8:55 AM

## 2020-06-04 NOTE — Consult Note (Signed)
CARDIOLOGY CONSULT NOTE               Patient ID: Paul Goodman MRN: [id number] DOB/AGE: 06/24/1945 75 y.o.  Admit date: 06/02/2020 Referring Physician Dr Rory Percy Neurology Primary Physician Dr. Gorden Harms primary Primary Cardiologist [facility] Reason for Consultation A. fib / CVA  HPI: Patient is a 75 year old white male multiple medical problems presented with hypertension hyperlipidemia diabetes COPD murmur obstructive sleep apnea anxiety presented with acute CVA received thrombolytic therapy still has significant residual left-sided hemiparesis patient also was found to have new onset atrial fibrillation so cardiology consultation was recommended.  Patient denies any previous cardiac history denies any myocardial infarction PCI stent coronary artery disease she has a history of obstructive sleep apnea is not clear if he is compliant with CPAP.  Patient now here for cardiology follow-up and evaluation  Review of systems complete and found to be negative unless listed above     Past Medical History:  Diagnosis Date  . Anxiety   . Arthritis   . COPD (chronic obstructive pulmonary disease) ([place])   . Diabetes ([place])   . ED (erectile dysfunction)   . Heart murmur   . Hyperlipidemia   . Hypertension   . Melanoma ([place])   . Sleep apnea     Past Surgical History:  Procedure Laterality Date  . EYE SURGERY Right    cancer  . HAND SURGERY Left 1966  . KNEE SURGERY Left 1973  . TONSILLECTOMY  1964    Medications Prior to Admission  Medication Sig Dispense Refill Last Dose  . albuterol (PROVENTIL HFA;VENTOLIN HFA) 108 (90 BASE) MCG/ACT inhaler INHALE 2 PUFFS EVERY 4 - 6 HOURS AS NEEDED   prn at prn  . aspirin EC 81 MG tablet Take 81 mg by mouth daily.   06/01/2020 at 2000  . Cinnamon 500 MG capsule Take by mouth.   Past Week at Unknown time  . FARXIGA 10 MG TABS tablet Take 10 mg by mouth daily.   06/02/2020 at 0730  . fenofibrate 160 MG tablet Take 160 mg by mouth daily.    06/01/2020 at 2000  . furosemide (LASIX) 20 MG tablet Take 20 mg by mouth daily as needed.   PRN at PRN  . glipiZIDE (GLUCOTROL XL) 5 MG 24 hr tablet Take 10 mg by mouth daily. 2 tablets daily   06/01/2020 at 2000  . glyBURIDE-metformin (GLUCOVANCE) 2.5-500 MG tablet Take 2 tablets by mouth daily.   06/01/2020 at 2000  . L-Lysine 500 MG TABS Take 1 tablet by mouth daily.   Past Week at Unknown time  . LORazepam (ATIVAN) 1 MG tablet Take 0.5-1 mg by mouth daily as needed. TAKE ONE-HALF (1/2) TO ONE (1) TABLET BY MOUTH DAILY AS NEEDED FOR ANXIETY, PANIC, OR THROAT SWELLING   05/31/2020 at PRN  . losartan-hydrochlorothiazide (HYZAAR) 100-25 MG per tablet Take 1 tablet by mouth daily.   06/01/2020 at 2000  . Magnesium 500 MG TABS Take 1 tablet by mouth daily.   Past Week at Unknown time  . niacin 500 MG tablet Take 500 mg by mouth daily.   Past Week at Unknown time  . PARoxetine (PAXIL) 20 MG tablet Take 20 mg by mouth daily.   06/01/2020 at 2000  . rOPINIRole (REQUIP) 0.5 MG tablet Take 3 tablets by mouth at bedtime.   06/01/2020 at 2000  . Saw Palmetto 450 MG CAPS Take 1 capsule by mouth daily.   Past Week at Unknown time  . SOLIQUA  100-33 UNT-MCG/ML SOPN Inject 60 Units into the skin in the morning.   06/02/2020 at 0730  . tiotropium (SPIRIVA) 18 MCG inhalation capsule Place 18 mcg into inhaler and inhale daily as needed.   prn at prn  . vitamin B-12 (CYANOCOBALAMIN) 1000 MCG tablet Take 1,000 mcg by mouth daily.   06/01/2020 at 2000  . glucose blood test strip USE AS DIRECTED TWICE DAILY   NA at NA  . Insulin Pen Needle 32G X 4 MM MISC USE AS DIRECTED   NA at NA  . Zinc 50 MG TABS Take 1 tablet by mouth daily. (Patient not taking: Reported on 06/02/2020)   Not Taking at Unknown time   Social History   Socioeconomic History  . Marital status: Married    Spouse name: Not on file  . Number of children: Not on file  . Years of education: Not on file  . Highest education level: Not on file  Occupational  History  . Not on file  Tobacco Use  . Smoking status: Former Smoker    Packs/day: 0.00    Types: Cigarettes  . Smokeless tobacco: Former Systems developer  . Tobacco comment: quit 1992  Vaping Use  . Vaping Use: Never used  Substance and Sexual Activity  . Alcohol use: Not Currently    Alcohol/week: 14.0 standard drinks    Types: 14 Standard drinks or equivalent per week  . Drug use: Yes    Types: Marijuana  . Sexual activity: Not on file  Other Topics Concern  . Not on file  Social History Narrative  . Not on file   Social Determinants of Health   Financial Resource Strain: Not on file  Food Insecurity: Not on file  Transportation Needs: Not on file  Physical Activity: Not on file  Stress: Not on file  Social Connections: Not on file  Intimate Partner Violence: Not on file    Family History  Problem Relation Age of Onset  . Kidney Stones Father   . Kidney disease Neg Hx   . Prostate cancer Neg Hx   . Kidney cancer Neg Hx   . Bladder Cancer Neg Hx       Review of systems complete and found to be negative unless listed above      PHYSICAL EXAM  General: Well developed, well nourished, in no acute distress HEENT:  Normocephalic and atramatic Neck:  No JVD.  Lungs: Clear bilaterally to auscultation and percussion. Heart: Irregular irregular. Normal S1 and S2 without gallops or murmurs.  Abdomen: Bowel sounds are positive, abdomen soft and non-tender  Msk:  Back normal, normal gait. Normal strength and tone for age. Extremities: No clubbing, cyanosis or edema.   Neuro: Alert and oriented X 3.  Left-sided hemiparesis Psych:  Good affect, responds appropriately  Labs:   Lab Results  Component Value Date   WBC 8.7 06/04/2020   HGB 14.9 06/04/2020   HCT 44.6 06/04/2020   MCV 84.3 06/04/2020   PLT 184 06/04/2020    Recent Labs  Lab 06/02/20 2010 06/03/20 0521 06/04/20 0406  NA 137   < > 140  K 3.3*   < > 3.3*  CL 100   < > 105  CO2 24   < > 27  BUN 23   < >  22  CREATININE 1.47*   < > 1.18  CALCIUM 9.3   < > 8.7*  PROT 7.4  --   --   BILITOT 0.5  --   --  ALKPHOS 65  --   --   ALT 19  --   --   AST 19  --   --   GLUCOSE 142*   < > 85   < > = values in this interval not displayed.   Lab Results  Component Value Date   TROPONINI <0.03 02/26/2017    Lab Results  Component Value Date   CHOL 144 06/03/2020   Lab Results  Component Value Date   HDL 42 06/03/2020   Lab Results  Component Value Date   LDLCALC 87 06/03/2020   Lab Results  Component Value Date   TRIG 76 06/03/2020   Lab Results  Component Value Date   CHOLHDL 3.4 06/03/2020   No results found for: LDLDIRECT    Radiology: CT Angio Head W or Wo Contrast  Result Date: 06/02/2020 CLINICAL DATA:  Neuro deficit, acute, stroke suspected EXAM: CT ANGIOGRAPHY HEAD AND NECK TECHNIQUE: Multidetector CT imaging of the head and neck was performed using the standard protocol during bolus administration of intravenous contrast. Multiplanar CT image reconstructions and MIPs were obtained to evaluate the vascular anatomy. Carotid stenosis measurements (when applicable) are obtained utilizing NASCET criteria, using the distal internal carotid diameter as the denominator. CONTRAST:  120mL OMNIPAQUE IOHEXOL 350 MG/ML SOLN COMPARISON:  06/02/2020 and prior. FINDINGS: CTA NECK FINDINGS Aortic arch: Aortic arch atherosclerotic calcifications. Standard branching. Patent great vessel origins. Mild narrowing of the proximal right subclavian artery secondary to calcified atheromatous plaque. Right carotid system: Patent. Carotid bifurcation atherosclerotic calcifications without significant narrowing. Left carotid system: Patent. Minimal bifurcation atherosclerotic calcifications without significant narrowing. Vertebral arteries: Codominant.  Patent. Skeleton: No acute finding.  Multilevel spondylosis. Other neck: No acute soft tissue finding. Upper chest: No acute finding.  Upper lung atelectasis.  Review of the MIP images confirms the above findings CTA HEAD FINDINGS Anterior circulation: Patent ICAs. Bilateral carotid siphon atherosclerotic calcifications with moderate right and mild left cavernous ICA narrowing. Patent ACAs. Patent left MCA. Patent right MCA. Posterior circulation: Patent V4 segments and proximal PICA. Patent basilar and superior cerebellar arteries. Fetal origin of the left PCA, patent. Patent right PCA. Venous sinuses: As permitted by contrast timing, patent. Anatomic variants: Please see above. Review of the MIP images confirms the above findings IMPRESSION: CTA neck: No emergent vascular finding within the neck. Mild proximal right subclavian artery narrowing. CTA head: No emergent vascular finding within the head. Moderate right and mild left cavernous ICA narrowing. Electronically Signed   By: Primitivo Gauze M.D.   On: 06/02/2020 21:17   CT Angio Neck W and/or Wo Contrast  Result Date: 06/02/2020 CLINICAL DATA:  Neuro deficit, acute, stroke suspected EXAM: CT ANGIOGRAPHY HEAD AND NECK TECHNIQUE: Multidetector CT imaging of the head and neck was performed using the standard protocol during bolus administration of intravenous contrast. Multiplanar CT image reconstructions and MIPs were obtained to evaluate the vascular anatomy. Carotid stenosis measurements (when applicable) are obtained utilizing NASCET criteria, using the distal internal carotid diameter as the denominator. CONTRAST:  178mL OMNIPAQUE IOHEXOL 350 MG/ML SOLN COMPARISON:  06/02/2020 and prior. FINDINGS: CTA NECK FINDINGS Aortic arch: Aortic arch atherosclerotic calcifications. Standard branching. Patent great vessel origins. Mild narrowing of the proximal right subclavian artery secondary to calcified atheromatous plaque. Right carotid system: Patent. Carotid bifurcation atherosclerotic calcifications without significant narrowing. Left carotid system: Patent. Minimal bifurcation atherosclerotic calcifications  without significant narrowing. Vertebral arteries: Codominant.  Patent. Skeleton: No acute finding.  Multilevel spondylosis. Other neck: No acute soft tissue  finding. Upper chest: No acute finding.  Upper lung atelectasis. Review of the MIP images confirms the above findings CTA HEAD FINDINGS Anterior circulation: Patent ICAs. Bilateral carotid siphon atherosclerotic calcifications with moderate right and mild left cavernous ICA narrowing. Patent ACAs. Patent left MCA. Patent right MCA. Posterior circulation: Patent V4 segments and proximal PICA. Patent basilar and superior cerebellar arteries. Fetal origin of the left PCA, patent. Patent right PCA. Venous sinuses: As permitted by contrast timing, patent. Anatomic variants: Please see above. Review of the MIP images confirms the above findings IMPRESSION: CTA neck: No emergent vascular finding within the neck. Mild proximal right subclavian artery narrowing. CTA head: No emergent vascular finding within the head. Moderate right and mild left cavernous ICA narrowing. Electronically Signed   By: Primitivo Gauze M.D.   On: 06/02/2020 21:17   MR BRAIN WO CONTRAST  Result Date: 06/03/2020 CLINICAL DATA:  Stroke. Left-sided weakness, slurred speech. Headache. EXAM: MRI HEAD WITHOUT CONTRAST TECHNIQUE: Multiplanar, multiecho pulse sequences of the brain and surrounding structures were obtained without intravenous contrast. COMPARISON:  CT head 06/02/2020 FINDINGS: Brain: Multiple small areas of acute infarct in the right posterior MCA territory involving white matter and cortex. Small acute infarct left medial parietal lobe. Negative for hemorrhage Ventricle size and cerebral volume normal. Mild white matter changes consistent with chronic ischemia. Negative for mass lesion. Vascular: Normal arterial flow voids. Skull and upper cervical spine: Negative Sinuses/Orbits: Negative Other: None IMPRESSION: Multiple small areas of acute infarct in the right MCA territory.  Small acute infarct left medial parietal lobe. Negative for hemorrhage. Electronically Signed   By: Franchot Gallo M.D.   On: 06/03/2020 21:27   CT CEREBRAL PERFUSION W CONTRAST  Result Date: 06/02/2020 CLINICAL DATA:  Neuro deficit, acute, stroke suspected EXAM: CT PERFUSION BRAIN TECHNIQUE: Multiphase CT imaging of the brain was performed following IV bolus contrast injection. Subsequent parametric perfusion maps were calculated using RAPID software. CONTRAST:  17mL OMNIPAQUE IOHEXOL 350 MG/ML SOLN COMPARISON:  06/02/2020 and prior. FINDINGS: CT Brain Perfusion Findings: CBF (<30%) Volume: 69mL Perfusion (Tmax>6.0s) volume: 66mL Mismatch Volume: 74mL ASPECTS on noncontrast CT Head: 10 at 20:06 today. Infarct Core: 0 mL Infarction Location:Not applicable. IMPRESSION: No evidence of infarct. There are regions of slightly delayed perfusion involving the bilateral watershed territories. Electronically Signed   By: Primitivo Gauze M.D.   On: 06/02/2020 21:01   ECHOCARDIOGRAM COMPLETE  Result Date: 06/03/2020    ECHOCARDIOGRAM REPORT   Patient Name:   SAVION DUNAVIN Date of Exam: 06/03/2020 Medical Rec #:  [id number]         Height:       74.0 in Accession #:    [id number]        Weight:       262.0 lb Date of Birth:  1946/01/21         BSA:          2.438 m Patient Age:    36 years          BP:           104/91 mmHg Patient Gender: M                 HR:           56 bpm. Exam Location:  [facility] Procedure: 2D Echo, Color Doppler and Cardiac Doppler Indications:     Cardiomyopathy- unsecified I42.9  History:         Patient has no prior history of Echocardiogram  examinations.                  COPD, Signs/Symptoms:Murmur; Risk Factors:Hypertension and                  Diabetes.  Sonographer:     Sherrie Sport RDCS (AE) Referring Phys:  [id number] [place] L [place] Diagnosing Phys: Ida Rogue MD IMPRESSIONS  1. Left ventricular ejection fraction, by estimation, is <20%. The left ventricle has severely decreased  function. The left ventricle demonstrates global hypokinesis. Left ventricular diastolic parameters are indeterminate.  2. Right ventricular systolic function is normal. The right ventricular size is normal.  3. Left atrial size was moderately dilated.  4. The inferior vena cava is dilated in size with <50% respiratory variability, suggesting right atrial pressure of 15 mmHg. FINDINGS  Left Ventricle: Left ventricular ejection fraction, by estimation, is <20%. The left ventricle has severely decreased function. The left ventricle demonstrates global hypokinesis. The left ventricular internal cavity size was normal in size. There is no  left ventricular hypertrophy. Left ventricular diastolic parameters are indeterminate. Right Ventricle: The right ventricular size is normal. No increase in right ventricular wall thickness. Right ventricular systolic function is normal. Left Atrium: Left atrial size was moderately dilated. Right Atrium: Right atrial size was normal in size. Pericardium: There is no evidence of pericardial effusion. Mitral Valve: The mitral valve is normal in structure. No evidence of mitral valve regurgitation. No evidence of mitral valve stenosis. Tricuspid Valve: The tricuspid valve is normal in structure. Tricuspid valve regurgitation is mild . No evidence of tricuspid stenosis. Aortic Valve: The aortic valve is normal in structure. Aortic valve regurgitation is not visualized. Mild aortic valve sclerosis is present, with no evidence of aortic valve stenosis. Aortic valve mean gradient measures 3.0 mmHg. Aortic valve peak gradient measures 4.0 mmHg. Aortic valve area, by VTI measures 2.27 cm. Pulmonic Valve: The pulmonic valve was normal in structure. Pulmonic valve regurgitation is not visualized. No evidence of pulmonic stenosis. Aorta: The aortic root is normal in size and structure. Venous: The inferior vena cava is dilated in size with less than 50% respiratory variability, suggesting right  atrial pressure of 15 mmHg. IAS/Shunts: No atrial level shunt detected by color flow Doppler.  LEFT VENTRICLE PLAX 2D LVIDd:         4.69 cm LVIDs:         4.19 cm LV PW:         1.14 cm LV IVS:        0.97 cm LVOT diam:     2.00 cm LV SV:         37 LV SV Index:   15 LVOT Area:     3.14 cm  LV Volumes (MOD) LV vol d, MOD A2C: 130.0 ml LV vol d, MOD A4C: 221.0 ml LV vol s, MOD A2C: 111.0 ml LV vol s, MOD A4C: 162.0 ml LV SV MOD A2C:     19.0 ml LV SV MOD A4C:     221.0 ml LV SV MOD BP:      41.5 ml RIGHT VENTRICLE RV Basal diam:  3.48 cm RV S prime:     7.94 cm/s TAPSE (M-mode): 4.0 cm LEFT ATRIUM              Index       RIGHT ATRIUM           Index LA diam:        4.80 cm  1.97  cm/m  RA Area:     16.90 cm LA Vol (A2C):   110.0 ml 45.12 ml/m RA Volume:   46.50 ml  19.07 ml/m LA Vol (A4C):   71.4 ml  29.29 ml/m LA Biplane Vol: 88.7 ml  36.39 ml/m  AORTIC VALVE                   PULMONIC VALVE AV Area (Vmax):    2.28 cm    PV Vmax:        0.62 m/s AV Area (Vmean):   1.99 cm    PV Peak grad:   1.5 mmHg AV Area (VTI):     2.27 cm    RVOT Peak grad: 2 mmHg AV Vmax:           100.00 cm/s AV Vmean:          77.800 cm/s AV VTI:            0.162 m AV Peak Grad:      4.0 mmHg AV Mean Grad:      3.0 mmHg LVOT Vmax:         72.50 cm/s LVOT Vmean:        49.300 cm/s LVOT VTI:          0.117 m LVOT/AV VTI ratio: 0.72  AORTA Ao Root diam: 3.70 cm MITRAL VALVE                TRICUSPID VALVE MV Area (PHT): 4.93 cm     TR Peak grad:   27.7 mmHg MV Decel Time: 154 msec     TR Vmax:        263.00 cm/s MV E velocity: 121.00 cm/s                             SHUNTS                             Systemic VTI:  0.12 m                             Systemic Diam: 2.00 cm Ida Rogue MD Electronically signed by Ida Rogue MD Signature Date/Time: 06/03/2020/5:19:01 PM    Final    CT HEAD CODE STROKE WO CONTRAST  Result Date: 06/02/2020 CLINICAL DATA:  Code stroke. Slurred speech and headache with left-sided weakness. EXAM: CT  HEAD WITHOUT CONTRAST TECHNIQUE: Contiguous axial images were obtained from the base of the skull through the vertex without intravenous contrast. COMPARISON:  None. FINDINGS: Brain: There is no mass, hemorrhage or extra-axial collection. The size and configuration of the ventricles and extra-axial CSF spaces are normal. There is hypoattenuation of the periventricular white matter, most commonly indicating chronic ischemic microangiopathy. Vascular: No abnormal hyperdensity of the major intracranial arteries or dural venous sinuses. No intracranial atherosclerosis. Skull: The visualized skull base, calvarium and extracranial soft tissues are normal. Unchanged right occipital osteoma. Sinuses/Orbits: No fluid levels or advanced mucosal thickening of the visualized paranasal sinuses. No mastoid or middle ear effusion. The orbits are normal. ASPECTS [facility] Stroke Program Early CT Score) - Ganglionic level infarction (caudate, lentiform nuclei, internal capsule, insula, M1-M3 cortex): 7 - Supraganglionic infarction (M4-M6 cortex): 3 Total score (0-10 with 10 being normal): 10 IMPRESSION: 1. Chronic ischemic microangiopathy without acute intracranial abnormality. 2. ASPECTS is 10. 3. These results were  called by telephone at the time of interpretation on 06/02/2020 at 8:06 pm to provider [facility] , who verbally acknowledged these results. Electronically Signed   By: Ulyses Jarred M.D.   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 Plan Agree with telemetry post thrombolytics and CVA EKG suggest atrial fibrillation recommend anticoagulation as you are doing with Eliquis Recommend starting amiodarone to help with  rhythm management 200 twice a day Recommend Coreg low-dose to help with cardiomyopathy as well as other heart failure therapies ACE ARB or Entresto beta-blockade possibly spironolactone if blood pressure tolerates Discontinue Cardizem for rate management since the patient has a significant cardiomyopathy we will switch to beta-blocker Significant cardiomyopathy may need evaluation for coronary disease at some point either functional study or cardiac cath but will wait until patient is outpatient and recovers from his CVA  Signed: Yolonda Kida MD 06/04/2020, 12:45 PM

## 2020-06-04 NOTE — Progress Notes (Signed)
NAME:  Paul Goodman, MRN:  [id number], DOB:  [birth year], LOS: 2 ADMISSION DATE:  06/02/2020, CONSULTATION DATE: 06/02/2020 REFERRING MD: Dr. Charna Archer, CHIEF COMPLAINT:   Numbness, weakness, aphasia  Brief History:  75 year old male presenting to the ED as a code stroke status post TPA administration admitted to the ICU for monitoring.  Overnight:  Stable and done well  Significant Hospital Events:  06/02/2020-code stroke initiated, tPA administered, admitted to ICU for monitoring 06/03/2020-passed swallow  Consults:  Neurology   Significant Diagnostic Tests:  06/02/2020 CT w/o contrast >> aspects= 10, no acute intracranial abnormality, chronic ischemic microangiopathic angiopathy 06/02/2020 CTa head & neck, with cerebral perfusion >> no evidence of infarct, regions of slightly delayed perfusion involving bilateral watershed territories Micro Data:  06/02/2020 COVID-19 >> negative   Interim History / Subjective:  Patient sitting up in stretcher alert and responsive.  He is very hard of hearing, and has to read lips intermittently.  Patient reports since tPA administration obtaining some feeling back in his left upper extremity, but is still having difficulty moving all of his fingers at well.  No complaints of pain.  Patient failed bedside swallow, speech eval ordered for 06/03/2020.  Wife bedside, both updated on plan of care all questions and concerns answered  Passed swallow eval today   Objective   Blood pressure (!) 125/100, pulse 61, temperature 98.5 F (36.9 C), resp. rate 18, height 6\' 2"  (1.88 m), weight 118.8 kg, SpO2 92 %.        Intake/Output Summary (Last 24 hours) at 06/04/2020 1304 Last data filed at 06/04/2020 0400 Gross per 24 hour  Intake 87.71 ml  Output 1350 ml  Net -1262.29 ml   Filed Weights   06/02/20 1950  Weight: 118.8 kg    Examination: General: Adult male, critically/chronically ill, NAD HEENT: MM pink/moist, anicteric, atraumatic, neck  supple Neuro: *A&O x 4, able to follow commands, PERRL +3, MAE- weakness in L fingers CV: s1s2 Irregular in A-fib on monitor, no r/m/g Pulm: Regular, non labored on room air, breath sounds clear-BUL & diminished-BLL GI: soft, rounded, non tender, bs x 4 Skin: no rashes/lesions noted Extremities: warm/dry, pulses + 2 R/P, trace edema noted BLE  Resolved Hospital Problem list     Assessment & Plan:  Acute CVA New onset atrial fibrillation Patient reported weakness in the left upper extremity, inability to move fingers well, aphasia & coffee dripping out of mouth unintentionally as well as a right-sided headache.  Last known well 06/02/2020 at 1900.  Initial NIH SS score: 4, modified Rankin scale 0 points.  Telemetry neurology consulted after code stroke initiation in the ED and TPA was administered.  CTA negative for LVO. -Modified stroke scale every 15 minutes x2 hours, every 30 minutes x6 hours, every 1 hour x16 hours then per routine -Follow-up head CT 24 hours post TPA administration - Continuous cardiac monitoring -Labetalol every 2 as needed to maintain BP <180/105 & HR < 120 -Consider nicardipine drip if needed - Supplemental O2 to maintain SPO2 > 94% -Lipid panel & A1c ordered -Echocardiogram ordered -Daily BMP, replace electrolytes as needed  Appreciate PT/OT/ST evaluation Follow Neurology recommendations  CARDIAC: AF, variable rate brady at times Consider q day amio from BID EF <20%, moderate LAE Cards consult requested by neuro this morning, await input on rate control    Type 2 diabetes mellitus -A1c ordered -Every 4 CBG monitoring with SSI  Hypertension Hyperlipidemia -Lipid panel ordered for a.m. labs -Once patient has cleared speech  eval, start statin coverage -Home blood pressure control regimen on hold until cleared by SLP eval: Amlodipine 5 mg daily, losartan-hydrochlorothiazide daily  COPD OSA-patient states he has a CPAP ordered outpatient but does not  wear it Currently on room air. -Supplemental O2 to maintain SPO2 > 94% per CVA guidelines -Continuous pulse oximetry monitoring -Bronchodilators as needed -Continue home tiotropium  Anxiety Patient recently prescribed lorazepam 0.5-1 mg daily as needed.  Patient describes having episodes where he becomes anxious, feels as though his throat is closing and "passes out".  PCP ordered Ativan, patient states anecdotally that it has been helping. -Lorazepam as needed ordered IV until SLP clears the patient for safe swallowing  Best practice (evaluated daily)  Diet: N.p.o. Pain/Anxiety/Delirium protocol (if indicated): Home lorazepam ordered IV as needed VAP protocol (if indicated): N/A DVT prophylaxis: SCDs GI prophylaxis: Protonix Glucose control: Every 4 monitor with SSI Mobility: Strict bedrest for 24 hours Disposition: ICU  Goals of Care:  Case discussed in MD rounds today and with pharmacy  Code Status: Full  Labs   CBC: Recent Labs  Lab 06/02/20 2010 06/03/20 0521 06/04/20 0406  WBC 10.0 10.2 8.7  NEUTROABS 6.6  --   --   HGB 16.0 16.3 14.9  HCT 46.9 46.7 44.6  MCV 83.5 82.7 84.3  PLT 213 234 361    Basic Metabolic Panel: Recent Labs  Lab 06/02/20 2010 06/02/20 2352 06/03/20 0521 06/04/20 0406  NA 137  --  143 140  K 3.3*  --  3.8 3.3*  CL 100  --  106 105  CO2 24  --  28 27  GLUCOSE 142*  --  100* 85  BUN 23  --  26* 22  CREATININE 1.47*  --  1.21 1.18  CALCIUM 9.3  --  9.0 8.7*  MG 2.0  --  2.3 2.2  PHOS  --  4.3 3.3 2.9   GFR: Estimated Creatinine Clearance: 76.3 mL/min (by C-G formula based on SCr of 1.18 mg/dL). Recent Labs  Lab 06/02/20 2010 06/03/20 0521 06/04/20 0406  WBC 10.0 10.2 8.7    Liver Function Tests: Recent Labs  Lab 06/02/20 2010  AST 19  ALT 19  ALKPHOS 65  BILITOT 0.5  PROT 7.4  ALBUMIN 3.9   No results for input(s): LIPASE, AMYLASE in the last 168 hours. No results for input(s): AMMONIA in the last 168  hours.  ABG No results found for: PHART, PCO2ART, PO2ART, HCO3, TCO2, ACIDBASEDEF, O2SAT   Coagulation Profile: Recent Labs  Lab 06/02/20 2010  INR 1.1    Cardiac Enzymes: No results for input(s): CKTOTAL, CKMB, CKMBINDEX, TROPONINI in the last 168 hours.  HbA1C: Hgb A1c MFr Bld  Date/Time Value Ref Range Status  06/03/2020 05:21 AM 7.4 (H) 4.8 - 5.6 % Final    Comment:    (NOTE) Pre diabetes:          5.7%-6.4%  Diabetes:              >6.4%  Glycemic control for   <7.0% adults with diabetes     CBG: Recent Labs  Lab 06/03/20 1937 06/03/20 2335 06/04/20 0337 06/04/20 0711 06/04/20 1104  GLUCAP 147* 114* 93 86 161*    Critical Care Time devoted to patient care services described in this note is 35 minutes.  Overall, patient is critically ill, prognosis is guarded.   Patient with Multiorgan failure and at high risk for cardiac arrest and death.  Images reviewed directly and interpretation in  A&P is my own unless noted Labs reviewed and evaluated as noted in A&P

## 2020-06-04 NOTE — Progress Notes (Signed)
Neurology Progress Note   S:// Seen and examined.  Reports significant improvement in left-sided weakness.  Speech also more clear   O:// Current vital signs: BP 110/63   Pulse (!) 45   Temp 98.1 F (36.7 C)   Resp 13   Ht 6\' 2"  (1.88 m)   Wt 118.8 kg   SpO2 96%   BMI 33.64 kg/m  Vital signs in last 24 hours: Temp:  [97.6 F (36.4 C)-98.3 F (36.8 C)] 98.1 F (36.7 C) (01/29 0700) Pulse Rate:  [33-122] 45 (01/29 0700) Resp:  [10-27] 13 (01/29 0700) BP: (96-155)/(47-120) 110/63 (01/29 0700) SpO2:  [92 %-100 %] 96 % (01/29 0700) GENERAL: Awake, alert in NAD HEENT: - Normocephalic and atraumatic, dry mm, no LN++, no Thyromegally LUNGS - Clear to auscultation bilaterally with no wheezes CV - S1S2 RRR, no m/r/g, equal pulses bilaterally. ABDOMEN - Soft, nontender, nondistended with normoactive BS Ext: warm, well perfused, intact peripheral pulses, no edema NEURO:  Awake alert oriented x3 Speech is nondysarthric No aphasia Cranial nerves examination: Pupils equal round react light, extraocular movements intact, visual fields full, very subtle left lower facial weakness at rest with nearly symmetric nasolabial folds-much improved from yesterday, auditory daily intact, tongue and palate midline. Motor exam: 5/5 with no drift Sensory: No extinction, intact to touch bilaterally Coordination: No dysmetria Gait testing deferred NIH stroke scale-1 for facial  Medications  Current Facility-Administered Medications:  .   stroke: mapping our early stages of recovery book, , Does not apply, Once, Rust-Chester, Toribio Harbour L, NP .  acetaminophen (TYLENOL) tablet 650 mg, 650 mg, Oral, Q4H PRN **OR** acetaminophen (TYLENOL) 160 MG/5ML solution 650 mg, 650 mg, Per Tube, Q4H PRN **OR** acetaminophen (TYLENOL) suppository 650 mg, 650 mg, Rectal, Q4H PRN, Rust-Chester, Britton L, NP .  albuterol (VENTOLIN HFA) 108 (90 Base) MCG/ACT inhaler 2 puff, 2 puff, Inhalation, Q6H PRN,  Rust-Chester, Britton L, NP, 2 puff at 06/03/20 1216 .  aspirin EC tablet 81 mg, 81 mg, Oral, Daily, Amie Portland, MD .  atorvastatin (LIPITOR) tablet 80 mg, 80 mg, Oral, Daily, Nelle Don, MD, 80 mg at 06/03/20 1831 .  Chlorhexidine Gluconate Cloth 2 % PADS 6 each, 6 each, Topical, Daily, Nelle Don, MD, 6 each at 06/03/20 1926 .  dapagliflozin propanediol (FARXIGA) tablet 10 mg, 10 mg, Oral, Daily, Nelle Don, MD, 10 mg at 06/03/20 1019 .  diltiazem (CARDIZEM) tablet 60 mg, 60 mg, Oral, Q6H, Rust-Chester, Britton L, NP, 60 mg at 06/04/20 0530 .  fenofibrate tablet 160 mg, 160 mg, Oral, Daily, Nelle Don, MD, 160 mg at 06/03/20 1019 .  hydrALAZINE (APRESOLINE) injection 10 mg, 10 mg, Intravenous, Q6H PRN, Rust-Chester, Toribio Harbour L, NP .  losartan (COZAAR) tablet 100 mg, 100 mg, Oral, Daily, 100 mg at 06/03/20 1024 **AND** hydrochlorothiazide (HYDRODIURIL) tablet 25 mg, 25 mg, Oral, Daily, Nelle Don, MD, 25 mg at 06/03/20 1017 .  insulin aspart (novoLOG) injection 0-15 Units, 0-15 Units, Subcutaneous, Q4H, Rust-Chester, Britton L, NP, 2 Units at 06/03/20 1947 .  insulin glargine (LANTUS) injection 60 Units, 60 Units, Subcutaneous, Daily, Nelle Don, MD, 60 Units at 06/03/20 1017 .  LORazepam (ATIVAN) injection 0.5 mg, 0.5 mg, Intravenous, Q6H PRN, Rust-Chester, Britton L, NP .  magnesium oxide (MAG-OX) tablet 400 mg, 400 mg, Oral, Daily, Nelle Don, MD, 400 mg at 06/03/20 1017 .  metoprolol tartrate (LOPRESSOR) injection 5 mg, 5 mg, Intravenous, Q6H PRN, Rust-Chester, Britton L, NP, 5 mg at 06/03/20 0245 .  niacin tablet 500  mg, 500 mg, Oral, Daily, Nelle Don, MD, 500 mg at 06/03/20 1019 .  pantoprazole (PROTONIX) injection 40 mg, 40 mg, Intravenous, QHS, Rust-Chester, Britton L, NP, 40 mg at 06/03/20 2141 .  PARoxetine (PAXIL) tablet 20 mg, 20 mg, Oral, Daily, Nelle Don, MD, 20 mg at 06/03/20 1019 .  potassium chloride (KLOR-CON) packet 20 mEq, 20 mEq,  Oral, Q4H, Shanlever, Pierce Crane, RPH .  potassium chloride 10 mEq in 100 mL IVPB, 10 mEq, Intravenous, Q1 Hr x 4, Rust-Chester, Britton L, NP .  rOPINIRole (REQUIP) tablet 1.5 mg, 1.5 mg, Oral, QHS, Nelle Don, MD, 1.5 mg at 06/03/20 2140 .  senna-docusate (Senokot-S) tablet 1 tablet, 1 tablet, Oral, QHS PRN, Rust-Chester, Britton L, NP .  tiotropium (SPIRIVA) inhalation capsule ([facility] use ONLY) 18 mcg, 18 mcg, Inhalation, Daily, Rust-Chester, Britton L, NP, 18 mcg at 06/03/20 0803 .  vitamin B-12 (CYANOCOBALAMIN) tablet 1,000 mcg, 1,000 mcg, Oral, Daily, Nelle Don, MD, 1,000 mcg at 06/03/20 1017 .  zinc sulfate capsule 220 mg, 220 mg, Oral, Daily, Nelle Don, MD, 220 mg at 06/03/20 1017 Labs CBC    Component Value Date/Time   WBC 8.7 06/04/2020 0406   RBC 5.29 06/04/2020 0406   HGB 14.9 06/04/2020 0406   HGB 13.4 04/17/2012 2023   HCT 44.6 06/04/2020 0406   HCT 38.7 (L) 04/17/2012 2023   PLT 184 06/04/2020 0406   PLT 205 04/17/2012 2023   MCV 84.3 06/04/2020 0406   MCV 86 04/17/2012 2023   MCH 28.2 06/04/2020 0406   MCHC 33.4 06/04/2020 0406   RDW 13.4 06/04/2020 0406   RDW 14.0 04/17/2012 2023   LYMPHSABS 2.4 06/02/2020 2010   MONOABS 0.8 06/02/2020 2010   EOSABS 0.1 06/02/2020 2010   BASOSABS 0.0 06/02/2020 2010    CMP     Component Value Date/Time   NA 140 06/04/2020 0406   NA 139 04/17/2012 2023   K 3.3 (L) 06/04/2020 0406   K 3.8 04/17/2012 2023   CL 105 06/04/2020 0406   CL 105 04/17/2012 2023   CO2 27 06/04/2020 0406   CO2 28 04/17/2012 2023   GLUCOSE 85 06/04/2020 0406   GLUCOSE 126 (H) 04/17/2012 2023   BUN 22 06/04/2020 0406   BUN 15 04/17/2012 2023   CREATININE 1.18 06/04/2020 0406   CREATININE 0.77 04/17/2012 2023   CALCIUM 8.7 (L) 06/04/2020 0406   CALCIUM 9.0 04/17/2012 2023   PROT 7.4 06/02/2020 2010   PROT 7.8 04/17/2012 2023   ALBUMIN 3.9 06/02/2020 2010   ALBUMIN 4.1 04/17/2012 2023   AST 19 06/02/2020 2010   AST 25 04/17/2012  2023   ALT 19 06/02/2020 2010   ALT 46 04/17/2012 2023   ALKPHOS 65 06/02/2020 2010   ALKPHOS 113 04/17/2012 2023   BILITOT 0.5 06/02/2020 2010   BILITOT 0.5 04/17/2012 2023   GFRNONAA >60 06/04/2020 0406   GFRNONAA >60 04/17/2012 2023   GFRAA >60 02/26/2017 1400   GFRAA >60 04/17/2012 2023   LDL 87 glycosylated hemoglobin-7.4  Lipid Panel     Component Value Date/Time   CHOL 144 06/03/2020 0521   TRIG 76 06/03/2020 0521   HDL 42 06/03/2020 0521   CHOLHDL 3.4 06/03/2020 0521   VLDL 15 06/03/2020 0521   LDLCALC 87 06/03/2020 0521   2D echocardiogram with LVEF less than 20%.  Left atrium moderately dilated.  Imaging I have reviewed images in epic and the results pertinent to this consultation are: MRI of the brain completed yesterday:  Scattered acute infarct in right MCA territory, a small acute infarct in the left medial parietal lobe.  Negative for hemorrhage.  Assessment: 76 year old man, past history of diabetes, hypertension, hyperlipidemia, sleep apnea and COPD along with newfound atrial fibrillation, presented for evaluation of left-sided weakness, headache and slurred speech. Given IV TPA by telemedicine neurology. Improved left-sided weakness today with very subtle left facial asymmetry for an NIH score of 1. MRI reveals scattered embolic-looking infarcts predominantly in right MCA territory but also a small acute infarct in the left medial parietal lobe-likely secondary to the newfound atrial fibrillation. Also was A. fib with RVR when came in. 2D echo reveals ejection fraction less than 20%. No history of heart failure according to the patient.  Impression: Acute ischemic stroke-cardioembolic from newfound A. fib as well as CHF. New atrial fibrillation Cardiomyopathy-LVEF less than 20% on TTE COPD Hypertension Hyperlipidemia Diabetes  Recommendations: Would recommend a cardiology consultation for the newfound A. fib as well as severe cardiomyopathy. He needs  to be on anticoagulation from a stroke prevention perspective-can start Eliquis tomorrow. Aspirin today. Aspirin should be discontinued when starting Eliquis from a neurological standpoint unless needed for a cardiac reason. Pharmacy consult placed. Continue statin for LDL goal of less than 70 Goal A1c less than 7. PT OT speech therapy  Management of medical conditions per primary team as you are Cardiology consult requested and spoke with on-call Dr. Clayborn Bigness who will be evaluating the patient. Appreciate the consult.  Discussed with Dr. Merrilee Jansky on the unit.    -- Amie Portland, MD Neurologist [facility] Pager: [phone]  CRITICAL CARE ATTESTATION Performed by: Amie Portland, MD Total critical care time: 49 minutes Critical care time was exclusive of separately billable procedures and treating other patients and/or supervising APPs/Residents/Students Critical care was necessary to treat or prevent imminent or life-threatening deterioration due to ischemic stroke, post tPA, CHF, new Afib with RVR This patient is critically ill and at significant risk for neurological worsening and/or death and care requires constant monitoring. Critical care was time spent personally by me on the following activities: development of treatment plan with patient and/or surrogate as well as nursing, discussions with consultants, evaluation of patient's response to treatment, examination of patient, obtaining history from patient or surrogate, ordering and performing treatments and interventions, ordering and review of laboratory studies, ordering and review of radiographic studies, pulse oximetry, re-evaluation of patient's condition, participation in multidisciplinary rounds and medical decision making of high complexity in the care of this patient.

## 2020-06-04 NOTE — Progress Notes (Signed)
PT Cancellation Note  Patient Details Name: Paul Goodman MRN: [id number] DOB: 03-10-1946   Cancelled Treatment:    Reason Eval/Treat Not Completed: Other (comment).  PT consult received.  Chart reviewed.  Per discussion with pt's nurse, will hold therapy at this time d/t safety concerns (pt given Ativan this morning and with decreased level of alertness).  Pt also noted to still be on strict bedrest per orders (OT discussed this with nursing).  Will re-attempt PT evaluation tomorrow as appropriate.   Raquel Sarna Wenonah Milo 06/04/2020, 3:20 PM

## 2020-06-04 NOTE — Progress Notes (Signed)
OT Cancellation Note  Patient Details Name: Paul Goodman MRN: [id number] DOB: 1946-02-03   Cancelled Treatment:    Reason Eval/Treat Not Completed: Active bedrest order;Fatigue/lethargy limiting ability to participate. OT consult received.  Chart reviewed.  Per discussion with pt's nurse, will hold therapy at this time d/t safety concerns (pt given Ativan this morning and with decreased level of alertness).  Pt also noted to still be on strict bedrest per orders. Will re-attempt OT evaluation tomorrow as appropriate.  Dessie Coma, M.S. OTR/L  06/04/20, 3:31 PM  ascom [phone]

## 2020-06-04 NOTE — Progress Notes (Signed)
Met wife bedside. Husband was awake but napping off and on. She talked about their long marriage and how supportive he had been during her illness and she had to be the same for him. Was asked to pray for him. I assured her if and when needed a chaplain was always available.

## 2020-06-05 DIAGNOSIS — I639 Cerebral infarction, unspecified: Secondary | ICD-10-CM | POA: Diagnosis not present

## 2020-06-05 LAB — GLUCOSE, CAPILLARY
Glucose-Capillary: 142 mg/dL — ABNORMAL HIGH (ref 70–99)
Glucose-Capillary: 174 mg/dL — ABNORMAL HIGH (ref 70–99)
Glucose-Capillary: 191 mg/dL — ABNORMAL HIGH (ref 70–99)
Glucose-Capillary: 83 mg/dL (ref 70–99)
Glucose-Capillary: 88 mg/dL (ref 70–99)

## 2020-06-05 LAB — PHOSPHORUS: Phosphorus: 2.7 mg/dL (ref 2.5–4.6)

## 2020-06-05 LAB — BASIC METABOLIC PANEL
Anion gap: 8 (ref 5–15)
BUN: 19 mg/dL (ref 8–23)
CO2: 23 mmol/L (ref 22–32)
Calcium: 8.2 mg/dL — ABNORMAL LOW (ref 8.9–10.3)
Chloride: 106 mmol/L (ref 98–111)
Creatinine, Ser: 1 mg/dL (ref 0.61–1.24)
GFR, Estimated: >60 mL/min (ref >60)
Glucose, Bld: 86 mg/dL (ref 70–99)
Potassium: 3.4 mmol/L — ABNORMAL LOW (ref 3.5–5.1)
Sodium: 137 mmol/L (ref 135–145)

## 2020-06-05 LAB — CBC
HCT: 42.6 % (ref 39.0–52.0)
Hemoglobin: 14.4 g/dL (ref 13.0–17.0)
MCH: 28.6 pg (ref 26.0–34.0)
MCHC: 33.8 g/dL (ref 30.0–36.0)
MCV: 84.5 fL (ref 80.0–100.0)
Platelets: 184 10*3/uL (ref 150–400)
RBC: 5.04 MIL/uL (ref 4.22–5.81)
RDW: 13.2 % (ref 11.5–15.5)
WBC: 7.2 10*3/uL (ref 4.0–10.5)
nRBC: 0 % (ref 0.0–0.2)

## 2020-06-05 LAB — MAGNESIUM: Magnesium: 2.1 mg/dL (ref 1.7–2.4)

## 2020-06-05 MED ORDER — SPIRONOLACTONE 25 MG PO TABS
12.5000 mg | ORAL_TABLET | Freq: Every day | ORAL | Status: DC
  Administered 2020-06-05 – 2020-06-06 (×2): 12.5 mg via ORAL
  Filled 2020-06-05 (×2): qty 0.5

## 2020-06-05 MED ORDER — SACUBITRIL-VALSARTAN 97-103 MG PO TABS
0.5000 | ORAL_TABLET | Freq: Two times a day (BID) | ORAL | Status: DC
  Administered 2020-06-05 – 2020-06-06 (×3): 0.5 via ORAL
  Filled 2020-06-05 (×5): qty 0.5

## 2020-06-05 MED ORDER — CARVEDILOL 12.5 MG PO TABS
12.5000 mg | ORAL_TABLET | Freq: Two times a day (BID) | ORAL | Status: DC
Start: 2020-06-05 — End: 2020-06-06
  Administered 2020-06-05 – 2020-06-06 (×2): 12.5 mg via ORAL
  Filled 2020-06-05 (×2): qty 1

## 2020-06-05 MED ORDER — INSULIN ASPART 100 UNIT/ML ~~LOC~~ SOLN
0.0000 [IU] | Freq: Three times a day (TID) | SUBCUTANEOUS | Status: DC
  Administered 2020-06-06: 5 [IU] via SUBCUTANEOUS
  Filled 2020-06-05: qty 1

## 2020-06-05 MED ORDER — POTASSIUM CHLORIDE CRYS ER 20 MEQ PO TBCR
40.0000 meq | EXTENDED_RELEASE_TABLET | Freq: Once | ORAL | Status: AC
  Administered 2020-06-05: 40 meq via ORAL
  Filled 2020-06-05: qty 2

## 2020-06-05 NOTE — Progress Notes (Signed)
[facility] Cardiology    SUBJECTIVE: Patient feeling much better recently had a physical therapy session with walking and states that he did reasonably well good appetite no shortness of breath no chest pain denies palpitations gradually improving on left-sided weakness   Vitals:   06/05/20 0910 06/05/20 1020 06/05/20 1100 06/05/20 1200  BP: (!) 111/93 117/82 112/79 101/78  Pulse: (!) 113 (!) 40 (!) 53 (!) 49  Resp: (!) 23 (!) 24 (!) 21 20  Temp:    98.7 F (37.1 C)  TempSrc:    Oral  SpO2: 94% 96% 95% 94%  Weight:      Height:         Intake/Output Summary (Last 24 hours) at 06/05/2020 1250 Last data filed at 06/05/2020 1015 Gross per 24 hour  Intake 360 ml  Output 1575 ml  Net -1215 ml      PHYSICAL EXAM  General: Well developed, well nourished, in no acute distress HEENT:  Normocephalic and atramatic Neck:  No JVD.  Lungs: Clear bilaterally to auscultation and percussion. Heart: Irregular irregular. Normal S1 and S2 without gallops or murmurs.  Abdomen: Bowel sounds are positive, abdomen soft and non-tender  Msk:  Back normal, normal gait. Normal strength and tone for age. Extremities: No clubbing, cyanosis or edema.   Neuro: Alert and oriented X 3. Psych:  Good affect, responds appropriately   LABS: Basic Metabolic Panel: Recent Labs    06/04/20 0406 06/05/20 0416  NA 140 137  K 3.3* 3.4*  CL 105 106  CO2 27 23  GLUCOSE 85 86  BUN 22 19  CREATININE 1.18 1.00  CALCIUM 8.7* 8.2*  MG 2.2 2.1  PHOS 2.9 2.7   Liver Function Tests: Recent Labs    06/02/20 2010  AST 19  ALT 19  ALKPHOS 65  BILITOT 0.5  PROT 7.4  ALBUMIN 3.9   No results for input(s): LIPASE, AMYLASE in the last 72 hours. CBC: Recent Labs    06/02/20 2010 06/03/20 0521 06/04/20 0406 06/05/20 0416  WBC 10.0   < > 8.7 7.2  NEUTROABS 6.6  --   --   --   HGB 16.0   < > 14.9 14.4  HCT 46.9   < > 44.6 42.6  MCV 83.5   < > 84.3 84.5  PLT 213   < > 184 184   < > = values in this  interval not displayed.   Cardiac Enzymes: No results for input(s): CKTOTAL, CKMB, CKMBINDEX, TROPONINI in the last 72 hours. BNP: Invalid input(s): POCBNP D-Dimer: No results for input(s): DDIMER in the last 72 hours. Hemoglobin A1C: Recent Labs    06/03/20 0521  HGBA1C 7.4*   Fasting Lipid Panel: Recent Labs    06/03/20 0521  CHOL 144  HDL 42  LDLCALC 87  TRIG 76  CHOLHDL 3.4   Thyroid Function Tests: Recent Labs    06/02/20 2010  TSH 1.261   Anemia Panel: No results for input(s): VITAMINB12, FOLATE, FERRITIN, TIBC, IRON, RETICCTPCT in the last 72 hours.  MR BRAIN WO CONTRAST  Result Date: 06/03/2020 CLINICAL DATA:  Stroke. Left-sided weakness, slurred speech. Headache. EXAM: MRI HEAD WITHOUT CONTRAST TECHNIQUE: Multiplanar, multiecho pulse sequences of the brain and surrounding structures were obtained without intravenous contrast. COMPARISON:  CT head 06/02/2020 FINDINGS: Brain: Multiple small areas of acute infarct in the right posterior MCA territory involving white matter and cortex. Small acute infarct left medial parietal lobe. Negative for hemorrhage Ventricle size and cerebral volume normal.  Mild white matter changes consistent with chronic ischemia. Negative for mass lesion. Vascular: Normal arterial flow voids. Skull and upper cervical spine: Negative Sinuses/Orbits: Negative Other: None IMPRESSION: Multiple small areas of acute infarct in the right MCA territory. Small acute infarct left medial parietal lobe. Negative for hemorrhage. Electronically Signed   By: Franchot Gallo M.D.   On: 06/03/2020 21:27   ECHOCARDIOGRAM COMPLETE  Result Date: 06/03/2020    ECHOCARDIOGRAM REPORT   Patient Name:   Paul Goodman Date of Exam: 06/03/2020 Medical Rec #:  [id number]         Height:       74.0 in Accession #:    [id number]        Weight:       262.0 lb Date of Birth:  [birth year]         BSA:          2.438 m Patient Age:    75 years          BP:           104/91 mmHg  Patient Gender: M                 HR:           56 bpm. Exam Location:  [facility] Procedure: 2D Echo, Color Doppler and Cardiac Doppler Indications:     Cardiomyopathy- unsecified I42.9  History:         Patient has no prior history of Echocardiogram examinations.                  COPD, Signs/Symptoms:Murmur; Risk Factors:Hypertension and                  Diabetes.  Sonographer:     Sherrie Sport RDCS (AE) Referring Phys:  [id number] BRITTON L RUST-CHESTER Diagnosing Phys: Ida Rogue MD IMPRESSIONS  1. Left ventricular ejection fraction, by estimation, is <20%. The left ventricle has severely decreased function. The left ventricle demonstrates global hypokinesis. Left ventricular diastolic parameters are indeterminate.  2. Right ventricular systolic function is normal. The right ventricular size is normal.  3. Left atrial size was moderately dilated.  4. The inferior vena cava is dilated in size with <50% respiratory variability, suggesting right atrial pressure of 15 mmHg. FINDINGS  Left Ventricle: Left ventricular ejection fraction, by estimation, is <20%. The left ventricle has severely decreased function. The left ventricle demonstrates global hypokinesis. The left ventricular internal cavity size was normal in size. There is no  left ventricular hypertrophy. Left ventricular diastolic parameters are indeterminate. Right Ventricle: The right ventricular size is normal. No increase in right ventricular wall thickness. Right ventricular systolic function is normal. Left Atrium: Left atrial size was moderately dilated. Right Atrium: Right atrial size was normal in size. Pericardium: There is no evidence of pericardial effusion. Mitral Valve: The mitral valve is normal in structure. No evidence of mitral valve regurgitation. No evidence of mitral valve stenosis. Tricuspid Valve: The tricuspid valve is normal in structure. Tricuspid valve regurgitation is mild . No evidence of tricuspid stenosis. Aortic Valve: The aortic  valve is normal in structure. Aortic valve regurgitation is not visualized. Mild aortic valve sclerosis is present, with no evidence of aortic valve stenosis. Aortic valve mean gradient measures 3.0 mmHg. Aortic valve peak gradient measures 4.0 mmHg. Aortic valve area, by VTI measures 2.27 cm. Pulmonic Valve: The pulmonic valve was normal in structure. Pulmonic valve regurgitation is not visualized. No evidence of pulmonic stenosis. Aorta:  The aortic root is normal in size and structure. Venous: The inferior vena cava is dilated in size with less than 50% respiratory variability, suggesting right atrial pressure of 15 mmHg. IAS/Shunts: No atrial level shunt detected by color flow Doppler.  LEFT VENTRICLE PLAX 2D LVIDd:         4.69 cm LVIDs:         4.19 cm LV PW:         1.14 cm LV IVS:        0.97 cm LVOT diam:     2.00 cm LV SV:         37 LV SV Index:   15 LVOT Area:     3.14 cm  LV Volumes (MOD) LV vol d, MOD A2C: 130.0 ml LV vol d, MOD A4C: 221.0 ml LV vol s, MOD A2C: 111.0 ml LV vol s, MOD A4C: 162.0 ml LV SV MOD A2C:     19.0 ml LV SV MOD A4C:     221.0 ml LV SV MOD BP:      41.5 ml RIGHT VENTRICLE RV Basal diam:  3.48 cm RV S prime:     7.94 cm/s TAPSE (M-mode): 4.0 cm LEFT ATRIUM              Index       RIGHT ATRIUM           Index LA diam:        4.80 cm  1.97 cm/m  RA Area:     16.90 cm LA Vol (A2C):   110.0 ml 45.12 ml/m RA Volume:   46.50 ml  19.07 ml/m LA Vol (A4C):   71.4 ml  29.29 ml/m LA Biplane Vol: 88.7 ml  36.39 ml/m  AORTIC VALVE                   PULMONIC VALVE AV Area (Vmax):    2.28 cm    PV Vmax:        0.62 m/s AV Area (Vmean):   1.99 cm    PV Peak grad:   1.5 mmHg AV Area (VTI):     2.27 cm    RVOT Peak grad: 2 mmHg AV Vmax:           100.00 cm/s AV Vmean:          77.800 cm/s AV VTI:            0.162 m AV Peak Grad:      4.0 mmHg AV Mean Grad:      3.0 mmHg LVOT Vmax:         72.50 cm/s LVOT Vmean:        49.300 cm/s LVOT VTI:          0.117 m LVOT/AV VTI ratio: 0.72  AORTA  Ao Root diam: 3.70 cm MITRAL VALVE                TRICUSPID VALVE MV Area (PHT): 4.93 cm     TR Peak grad:   27.7 mmHg MV Decel Time: 154 msec     TR Vmax:        263.00 cm/s MV E velocity: 121.00 cm/s                             SHUNTS  Systemic VTI:  0.12 m                             Systemic Diam: 2.00 cm Ida Rogue MD Electronically signed by Ida Rogue MD Signature Date/Time: 06/03/2020/5:19:01 PM    Final      Echo severely depressed left ventricular function globally EF less than 25%  TELEMETRY: Atrial fibrillation rate 90-110 nonspecific ST-T wave changes  ASSESSMENT AND PLAN:  Active Problems:   Acute CVA (cerebrovascular accident) ([place]) Atrial fibrillation new 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% Hypertension Hyperlipidemia Diabetes type 2 Obesity COPD Anxiety GERD Possible obstructive sleep apnea   Plan Agree with therapy and management for recent CVA with residual left-sided hemiparesis Continue rate control for atrial fibrillation with Coreg Eliquis for anticoagulation amiodarone for rhythm Recommend switching from losartan to Entresto twice daily 49/51 twice daily Continue Lipitor therapy for hyperlipidemia Inhalers for COPD Recommend sleep study for possible obstructive sleep apnea Consider weight loss exercise portion control for mild obesity Continue Protonix therapy for reflux type symptoms Recommend heart failure therapy with Entresto Coreg add spironolactone consider low-dose Lasix Agree with diabetes management and control also agree with Wilder Glade for diabetes as well as for heart failure Continue with physical and occupational therapy post CVA At the patient follow-up with cardiology as an outpatient for atrial fibrillation management and control consideration for cardioversion     Yolonda Kida, MD 06/05/2020 12:50 PM

## 2020-06-05 NOTE — Progress Notes (Signed)
Neurology Progress Note   S:// Seen and examined this morning. No acute changes Reports nearly normal left-sided strength Reports on and off increased shortness of breath that he has started to notice more.  O:// Current vital signs: BP (!) 115/91 (BP Location: Right Arm)   Pulse (!) 112   Temp 98.5 F (36.9 C) (Oral)   Resp (!) 23   Ht 6\' 2"  (1.88 m)   Wt 118.8 kg   SpO2 96%   BMI 33.64 kg/m  Vital signs in last 24 hours: Temp:  [98.3 F (36.8 C)-98.5 F (36.9 C)] 98.5 F (36.9 C) (01/30 0800) Pulse Rate:  [40-119] 112 (01/30 0800) Resp:  [12-25] 23 (01/30 0800) BP: (102-134)/(53-100) 115/91 (01/30 0800) SpO2:  [92 %-98 %] 96 % (01/30 0800) GENERAL: Awake, alert in NAD HEENT: - Normocephalic and atraumatic, dry mm, no LN++, no Thyromegally LUNGS -scattered rales bilaterally CV - S1S2 RRR, no m/r/g, equal pulses bilaterally. ABDOMEN - Soft, nontender, nondistended with normoactive BS Ext: warm, well perfused, intact peripheral pulses, no edema NEURO:  Awake alert oriented x3 Speech is nondysarthric No aphasia Cranial nerves examination: Pupils equal round react light, extraocular movements intact, visual fields full, I still question a very subtle left lower facial weakness at rest with nearly symmetric nasolabial folds-much improved from yesterday, auditory daily intact, tongue and palate midline. Motor exam: 5/5 with no drift Sensory: No extinction, intact to touch bilaterally Coordination: No dysmetria Gait testing deferred NIH stroke scale-1 for facial Exam essentially unchanged from yesterday.  Medications  Current Facility-Administered Medications:  .   stroke: mapping our early stages of recovery book, , Does not apply, Once, Rust-Chester, Micheline Rough L, NP .  acetaminophen (TYLENOL) tablet 650 mg, 650 mg, Oral, Q4H PRN **OR** acetaminophen (TYLENOL) 160 MG/5ML solution 650 mg, 650 mg, Per Tube, Q4H PRN **OR** acetaminophen (TYLENOL) suppository 650 mg,  650 mg, Rectal, Q4H PRN, Rust-Chester, Britton L, NP .  albuterol (VENTOLIN HFA) 108 (90 Base) MCG/ACT inhaler 2 puff, 2 puff, Inhalation, Q6H PRN, Rust-Chester, Britton L, NP, 2 puff at 06/03/20 1216 .  amiodarone (PACERONE) tablet 200 mg, 200 mg, Oral, BID, Callwood, Dwayne D, MD, 200 mg at 06/04/20 2108 .  apixaban (ELIQUIS) tablet 5 mg, 5 mg, Oral, BID, Shanlever, Charmayne Sheer, RPH .  atorvastatin (LIPITOR) tablet 80 mg, 80 mg, Oral, Daily, Elyn Aquas, MD, 80 mg at 06/04/20 7948 .  carvedilol (COREG) tablet 6.25 mg, 6.25 mg, Oral, BID WC, Callwood, Dwayne D, MD, 6.25 mg at 06/05/20 0728 .  Chlorhexidine Gluconate Cloth 2 % PADS 6 each, 6 each, Topical, Daily, Elyn Aquas, MD, 6 each at 06/04/20 1704 .  dapagliflozin propanediol (FARXIGA) tablet 10 mg, 10 mg, Oral, Daily, Elyn Aquas, MD, 10 mg at 06/04/20 0840 .  fenofibrate tablet 160 mg, 160 mg, Oral, Daily, Elyn Aquas, MD, 160 mg at 06/04/20 0165 .  hydrALAZINE (APRESOLINE) injection 10 mg, 10 mg, Intravenous, Q6H PRN, Rust-Chester, Britton L, NP .  losartan (COZAAR) tablet 100 mg, 100 mg, Oral, Daily, 100 mg at 06/04/20 0839 **AND** hydrochlorothiazide (HYDRODIURIL) tablet 25 mg, 25 mg, Oral, Daily, Elyn Aquas, MD, 25 mg at 06/04/20 5374 .  insulin aspart (novoLOG) injection 0-15 Units, 0-15 Units, Subcutaneous, Q4H, Rust-Chester, Britton L, NP, 2 Units at 06/04/20 2314 .  insulin glargine (LANTUS) injection 60 Units, 60 Units, Subcutaneous, Daily, Elyn Aquas, MD, 60 Units at 06/04/20 0840 .  LORazepam (ATIVAN) injection 0.5 mg, 0.5 mg, Intravenous, Q6H PRN, Rust-Chester, Britton L, NP, 0.5 mg  at 06/04/20 0951 .  magnesium oxide (MAG-OX) tablet 400 mg, 400 mg, Oral, Daily, Nelle Don, MD, 400 mg at 06/04/20 0829 .  metoprolol tartrate (LOPRESSOR) injection 5 mg, 5 mg, Intravenous, Q6H PRN, Rust-Chester, Britton L, NP, 5 mg at 06/04/20 1802 .  niacin tablet 500 mg, 500 mg, Oral, Daily, Nelle Don, MD, 500 mg  at 06/04/20 0840 .  pantoprazole (PROTONIX) injection 40 mg, 40 mg, Intravenous, QHS, Rust-Chester, Britton L, NP, 40 mg at 06/04/20 2109 .  PARoxetine (PAXIL) tablet 20 mg, 20 mg, Oral, Daily, Nelle Don, MD, 20 mg at 06/04/20 0839 .  rOPINIRole (REQUIP) tablet 1.5 mg, 1.5 mg, Oral, QHS, Nelle Don, MD, 1.5 mg at 06/04/20 2108 .  senna-docusate (Senokot-S) tablet 1 tablet, 1 tablet, Oral, QHS PRN, Rust-Chester, Britton L, NP .  tiotropium (SPIRIVA) inhalation capsule ([facility] use ONLY) 18 mcg, 18 mcg, Inhalation, Daily, Rust-Chester, Britton L, NP, 18 mcg at 06/05/20 0728 .  vitamin B-12 (CYANOCOBALAMIN) tablet 1,000 mcg, 1,000 mcg, Oral, Daily, Nelle Don, MD, 1,000 mcg at 06/04/20 [phone] .  zinc sulfate capsule 220 mg, 220 mg, Oral, Daily, Nelle Don, MD, 220 mg at 06/04/20 0843 Labs CBC    Component Value Date/Time   WBC 7.2 06/05/2020 0416   RBC 5.04 06/05/2020 0416   HGB 14.4 06/05/2020 0416   HGB 13.4 04/17/2012 2023   HCT 42.6 06/05/2020 0416   HCT 38.7 (L) 04/17/2012 2023   PLT 184 06/05/2020 0416   PLT 205 04/17/2012 2023   MCV 84.5 06/05/2020 0416   MCV 86 04/17/2012 2023   MCH 28.6 06/05/2020 0416   MCHC 33.8 06/05/2020 0416   RDW 13.2 06/05/2020 0416   RDW 14.0 04/17/2012 2023   LYMPHSABS 2.4 06/02/2020 2010   MONOABS 0.8 06/02/2020 2010   EOSABS 0.1 06/02/2020 2010   BASOSABS 0.0 06/02/2020 2010    CMP     Component Value Date/Time   NA 137 06/05/2020 0416   NA 139 04/17/2012 2023   K 3.4 (L) 06/05/2020 0416   K 3.8 04/17/2012 2023   CL 106 06/05/2020 0416   CL 105 04/17/2012 2023   CO2 23 06/05/2020 0416   CO2 28 04/17/2012 2023   GLUCOSE 86 06/05/2020 0416   GLUCOSE 126 (H) 04/17/2012 2023   BUN 19 06/05/2020 0416   BUN 15 04/17/2012 2023   CREATININE 1.00 06/05/2020 0416   CREATININE 0.77 04/17/2012 2023   CALCIUM 8.2 (L) 06/05/2020 0416   CALCIUM 9.0 04/17/2012 2023   PROT 7.4 06/02/2020 2010   PROT 7.8 04/17/2012 2023   ALBUMIN  3.9 06/02/2020 2010   ALBUMIN 4.1 04/17/2012 2023   AST 19 06/02/2020 2010   AST 25 04/17/2012 2023   ALT 19 06/02/2020 2010   ALT 46 04/17/2012 2023   ALKPHOS 65 06/02/2020 2010   ALKPHOS 113 04/17/2012 2023   BILITOT 0.5 06/02/2020 2010   BILITOT 0.5 04/17/2012 2023   GFRNONAA >60 06/05/2020 0416   GFRNONAA >60 04/17/2012 2023   GFRAA >60 02/26/2017 1400   GFRAA >60 04/17/2012 2023   LDL 87 glycosylated hemoglobin-7.4  Lipid Panel     Component Value Date/Time   CHOL 144 06/03/2020 0521   TRIG 76 06/03/2020 0521   HDL 42 06/03/2020 0521   CHOLHDL 3.4 06/03/2020 0521   VLDL 15 06/03/2020 0521   LDLCALC 87 06/03/2020 0521   2D echocardiogram with LVEF less than 20%.  Left atrium moderately dilated.  Imaging I have reviewed images in epic and  the results pertinent to this consultation are: MRI of the brain completed yesterday: Scattered acute infarct in right MCA territory, a small acute infarct in the left medial parietal lobe.  Negative for hemorrhage.  Assessment: 75 year old man, past history of diabetes, hypertension, hyperlipidemia, sleep apnea and COPD along with newfound atrial fibrillation, presented for evaluation of left-sided weakness, headache and slurred speech. Given IV TPA by telemedicine neurology.  Post TPA MRI with no evidence of bleed.  Scattered infarcts, predominantly in the right MCA territory and a small acute infarct in the left medial parietal lobe. Improved left-sided weakness today with very subtle left facial asymmetry for an NIH score of 1.  Also was A. fib with RVR when came in.  2D echo reveals ejection fraction less than 20%. No history of heart failure according to the patient.  Impression: Acute ischemic stroke-cardioembolic from newfound A. fib as well as CHF. 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  May need cardiac cath at some point. Management of A. fib with RVR and heart failure per primary team and cardiology. Stop aspirin Start Eliquis today Continue statin for LDL goal of less than 70 Goal A1c less than 7. PT OT speech therapy  Management of medical conditions per primary team as you are  Discussed with Dr. Patsey Berthold on the unit.   We will continue to follow the patient with you.   -- Amie Portland, MD Neurologist [facility] Pager: [phone]

## 2020-06-05 NOTE — Evaluation (Signed)
Occupational Therapy Evaluation Patient Details Name: Paul Goodman MRN: [id number] DOB: 1945/11/06 Today's Date: 06/05/2020    History of Present Illness Pt is a 75 y.o. male presenting to hospital 1/27 for possible stroke (L facial droop, L UE weakness, slurring words/speech difficulties); a-fib noted with HR up to 150's.  TPA given 06/02/20 at 2036.  Follow-up MRI brain 1/28 showing multiple small areas of acute infarct in the R MCA territory and small acute infarct L medial parietal lobe.  PMH includes htn, HLD, DM, COPD, anxiety, sleep apnea, BPH, RLS, L hand surgery, L knee surgery, h/o R eye surgery (CA).   Clinical Impression   Paul Goodman was seen for OT evaluation this date. Prior to hospital admission, pt was INDEPENDENT for mobility. Pt lives c wife in 1 level home c 5 STE. Per conversation with PT, pt is near baseline mobility for functional mobility. Currently pt reporting symptoms have resolved. Pt demonstrates baseline independence to perform ADL tasks and no strength, sensory, coordination, cognitive, or visual deficits appreciated with assessment. All education complete - ECS handout provided. No skilled acute OT needs identified. Will sign off. Please re-consult if additional OT needs arise.     Follow Up Recommendations  No OT follow up    Equipment Recommendations  None recommended by OT    Recommendations for Other Services       Precautions / Restrictions Precautions Precautions: Fall Precaution Comments: Aspiration Restrictions Weight Bearing Restrictions: No      Mobility Bed Mobility Overal bed mobility: Modified Independent             ADL either performed or assessed with clinical judgement   ADL Overall ADL's : Independent                                                          Pertinent Vitals/Pain Pain Assessment: No/denies pain     Hand Dominance Right   Extremity/Trunk Assessment Upper Extremity  Assessment Upper Extremity Assessment: Overall WFL for tasks assessed (LUE 4+/5, 5 finger opposition intact and bilateraly decreased speed. RAM and palm to finger translation intact)   Lower Extremity Assessment Lower Extremity Assessment: Overall WFL for tasks assessed   Cervical / Trunk Assessment Cervical / Trunk Assessment: Normal   Communication Communication Communication: No difficulties   Cognition Arousal/Alertness: Awake/alert Behavior During Therapy: WFL for tasks assessed/performed Overall Cognitive Status: Within Functional Limits for tasks assessed                                     General Comments       Exercises Exercises: Other exercises Other Exercises Other Exercises: Pt edcuated re: OT role, DME recs, d/c recs, falls prevention, ECS, home/routines modifications Other Exercises: Grooming bed level   Shoulder Instructions      Home Living Family/patient expects to be discharged to:: Private residence Living Arrangements: Spouse/significant other Available Help at Discharge: Family Type of Home: House Home Access: Stairs to enter [employer] of Steps: 5 (from back deck) Entrance Stairs-Rails: Right;Left;Can reach both Home Layout: One level     Bathroom Shower/Tub: Occupational psychologist: Standard     Home Equipment: Grab bars - tub/shower;Shower seat - built in;Other (comment)  Additional Comments: Walking stick      Prior Functioning/Environment Level of Independence: Independent        Comments: Pt reports no recent falls.  Pt reports hobby is hog hunting.        OT Problem List: Decreased activity tolerance      OT Treatment/Interventions:      OT Goals(Current goals can be found in the care plan section) Acute Rehab OT Goals Patient Stated Goal: to go home OT Goal Formulation: With patient Time For Goal Achievement: 06/19/20 Potential to Achieve Goals: Good  OT Frequency:      AM-PAC  OT "6 Clicks" Daily Activity     Outcome Measure Help from another person eating meals?: None Help from another person taking care of personal grooming?: None Help from another person toileting, which includes using toliet, bedpan, or urinal?: A Little Help from another person bathing (including washing, rinsing, drying)?: A Little Help from another person to put on and taking off regular upper body clothing?: None Help from another person to put on and taking off regular lower body clothing?: None 6 Click Score: 22   End of Session    Activity Tolerance: Patient tolerated treatment well Patient left: in bed;with call bell/phone within reach  OT Visit Diagnosis: Other abnormalities of gait and mobility (R26.89)                Time: 9604-5409 OT Time Calculation (min): 11 min Charges:  OT General Charges $OT Visit: 1 Visit OT Evaluation $OT Eval Moderate Complexity: 1 Mod OT Treatments $Self Care/Home Management : 8-22 mins  Dessie Coma, M.S. OTR/L  06/05/20, 2:58 PM  ascom [phone]

## 2020-06-05 NOTE — Progress Notes (Signed)
NAME:  Paul Goodman, MRN:  [id number], DOB:  09-05-1945, LOS: 3 ADMISSION DATE:  06/02/2020, CONSULTATION DATE: 06/02/2020 REFERRING MD: Dr. Charna Archer, CHIEF COMPLAINT:   Numbness, weakness, aphasia  Brief History:  75 year old male presenting to the ED as a code stroke status post TPA administration admitted to the ICU for monitoring.  Overnight:  Stable and doing well  Significant Hospital Events:  06/02/2020-code stroke initiated, tPA administered, admitted to ICU for monitoring 06/03/2020-passed swallow  Consults:  Neurology   Significant Diagnostic Tests:  06/02/2020 CT w/o contrast >> aspects= 10, no acute intracranial abnormality, chronic ischemic microangiopathic angiopathy 06/02/2020 CTa head & neck, with cerebral perfusion >> no evidence of infarct, regions of slightly delayed perfusion involving bilateral watershed territories Micro Data:  06/02/2020 COVID-19 >> negative   Interim History / Subjective:  Patient sitting up in stretcher alert and responsive.  Still having some difficulty moving all of his fingers on the left hand.  Overall feels better.  Asking if he can go hunting.  No complaints of pain.  Passed swallow eval no further episodes of dysphagia.   Objective   Blood pressure (!) 115/91, pulse (!) 112, temperature 98.5 F (36.9 C), temperature source Oral, resp. rate (!) 23, height 6\' 2"  (1.88 m), weight 118.8 kg, SpO2 96 %.        Intake/Output Summary (Last 24 hours) at 06/05/2020 1941 Last data filed at 06/05/2020 0700 Gross per 24 hour  Intake -  Output 1700 ml  Net -1700 ml   Filed Weights   06/02/20 1950  Weight: 118.8 kg    Examination: General: Adult male, chronically ill, NAD HEENT: MM pink/moist, anicteric, atraumatic, neck supple Neuro: *A&O x 4, able to follow commands, PERRL +3, MAE- weakness in L fingers CV: s1s2 Irregular in A-fib on monitor, no r/m/g Pulm: Regular, non labored on room air, breath sounds clear-BUL &  diminished-BLL GI: soft, rounded, non tender, bs x 4 Skin: no rashes/lesions noted Extremities: warm/dry, pulses + 2 R/P, trace edema noted BLE  Resolved Hospital Problem list     Assessment & Plan:  Acute CVA New onset atrial fibrillation Patient reported weakness in the left upper extremity, inability to move fingers well, aphasia & coffee dripping out of mouth unintentionally as well as a right-sided headache.  Last known well 06/02/2020 at 1900.  Initial NIH SS score: 4, modified Rankin scale 0 points.  Telemetry neurology consulted after code stroke initiation in the ED and TPA was administered.  CTA negative for LVO. -Has passed period of stroke scale, can transfer to floor today -MRI head CT 24 hours post TPA administration shows small areas of acute infarct in the right MCA territory - Continuous cardiac monitoring -Labetalol every 2 as needed to maintain BP <180/105 & HR < 120 - Supplemental O2 to maintain SPO2 > 94% -Lipid panel & A1c ordered -Echocardiogram showed LVEF of less than 20%, cardiology consulted -Daily BMP, replace electrolytes as needed  Appreciate PT/OT/ST evaluation Follow Neurology recommendations, discussed with neurology start Eliquis  CARDIAC: Atrial fibrillation with RVR, rate better controlled On amiodarone EF <20%, moderate LAE Cards consult appreciate input will need cath at a later time  Type 2 diabetes mellitus -A1c ordered -Every 4 CBG monitoring with SSI  Hypertension Hyperlipidemia -Lipid panel ordered for a.m. labs -Cleared by speech eval, start statin coverage -Home blood pressure control regimen on hold until cleared by SLP eval: Amlodipine 5 mg daily, losartan-hydrochlorothiazide daily  COPD OSA-patient states he has a CPAP ordered  outpatient but does not wear it Currently on room air. -Supplemental O2 to maintain SPO2 > 94% per CVA guidelines -Continuous pulse oximetry monitoring -Bronchodilators as needed -Continue home  tiotropium  Anxiety Patient recently prescribed lorazepam 0.5-1 mg daily as needed.  Patient describes having episodes where he becomes anxious, feels as though his throat is closing and "passes out".  PCP ordered Ativan, patient states anecdotally that it has been helping. -Lorazepam as needed ordered IV until SLP clears the patient for safe swallowing  Best practice (evaluated daily)  Diet: N.p.o. Pain/Anxiety/Delirium protocol (if indicated): Home lorazepam ordered IV as needed VAP protocol (if indicated): N/A DVT prophylaxis: SCDs GI prophylaxis: Protonix Glucose control: Every 4 monitor with SSI Mobility: Strict bedrest for 24 hours Disposition: ICU  Goals of Care:  Case discussed in MD rounds today and with pharmacy  Code Status: Full  Labs   CBC: Recent Labs  Lab 06/02/20 2010 06/03/20 0521 06/04/20 0406 06/05/20 0416  WBC 10.0 10.2 8.7 7.2  NEUTROABS 6.6  --   --   --   HGB 16.0 16.3 14.9 14.4  HCT 46.9 46.7 44.6 42.6  MCV 83.5 82.7 84.3 84.5  PLT 213 234 184 440    Basic Metabolic Panel: Recent Labs  Lab 06/02/20 2010 06/02/20 2352 06/03/20 0521 06/04/20 0406 06/05/20 0416  NA 137  --  143 140 137  K 3.3*  --  3.8 3.3* 3.4*  CL 100  --  106 105 106  CO2 24  --  28 27 23   GLUCOSE 142*  --  100* 85 86  BUN 23  --  26* 22 19  CREATININE 1.47*  --  1.21 1.18 1.00  CALCIUM 9.3  --  9.0 8.7* 8.2*  MG 2.0  --  2.3 2.2 2.1  PHOS  --  4.3 3.3 2.9 2.7   GFR: Estimated Creatinine Clearance: 90.1 mL/min (by C-G formula based on SCr of 1 mg/dL). Recent Labs  Lab 06/02/20 2010 06/03/20 0521 06/04/20 0406 06/05/20 0416  WBC 10.0 10.2 8.7 7.2    Liver Function Tests: Recent Labs  Lab 06/02/20 2010  AST 19  ALT 19  ALKPHOS 65  BILITOT 0.5  PROT 7.4  ALBUMIN 3.9   No results for input(s): LIPASE, AMYLASE in the last 168 hours. No results for input(s): AMMONIA in the last 168 hours.  ABG No results found for: PHART, PCO2ART, PO2ART, HCO3, TCO2,  ACIDBASEDEF, O2SAT   Coagulation Profile: Recent Labs  Lab 06/02/20 2010  INR 1.1    Cardiac Enzymes: No results for input(s): CKTOTAL, CKMB, CKMBINDEX, TROPONINI in the last 168 hours.  HbA1C: Hgb A1c MFr Bld  Date/Time Value Ref Range Status  06/03/2020 05:21 AM 7.4 (H) 4.8 - 5.6 % Final    Comment:    (NOTE) Pre diabetes:          5.7%-6.4%  Diabetes:              >6.4%  Glycemic control for   <7.0% adults with diabetes     CBG: Recent Labs  Lab 06/04/20 1546 06/04/20 2036 06/04/20 2310 06/05/20 0347 06/05/20 0723  GLUCAP 106* 138* 122* 88 83    Level 3 follow-up  Will transfer patient to progressive cardiac, TRH service to pick up in the morning.   Renold Don, MD [facility]   *This note was dictated using voice recognition software/Dragon.  Despite best efforts to proofread, errors can occur which can change the meaning.  Any  change was purely unintentional.

## 2020-06-05 NOTE — Evaluation (Signed)
Physical Therapy Evaluation Patient Details Name: Paul Goodman MRN: [id number] DOB: [birth year] Today's Date: 06/05/2020   History of Present Illness  Pt is a 75 y.o. male presenting to hospital 1/27 for possible stroke (L facial droop, L UE weakness, slurring words/speech difficulties); a-fib noted with HR up to 150's.  TPA given 06/02/20 at 2036.  Follow-up MRI brain 1/28 showing multiple small areas of acute infarct in the R MCA territory and small acute infarct L medial parietal lobe.  PMH includes htn, HLD, DM, COPD, anxiety, sleep apnea, BPH, RLS, L hand surgery, L knee surgery, h/o R eye surgery (CA).  Clinical Impression  Prior to hospital admission, pt was independent with ambulation; lives with his wife in 1 level home with 5 STE B railings.  Currently pt is modified independent semi-supine to sitting edge of bed; CGA with transfers; and CGA ambulating 30 feet and then 60 feet (no AD).  Limited distance ambulating d/t pt's HR fluctuating between 97-138 bpm with activity (HR fluctuating between 104-127 bpm at rest).  Pt would benefit from skilled PT to address noted impairments and functional limitations (see below for any additional details).  Upon hospital discharge, no further PT needs anticipated.    Follow Up Recommendations No PT follow up    Equipment Recommendations  None recommended by PT    Recommendations for Other Services OT consult     Precautions / Restrictions Precautions Precautions: Fall Precaution Comments: Aspiration Restrictions Weight Bearing Restrictions: No      Mobility  Bed Mobility Overal bed mobility: Modified Independent             General bed mobility comments: Semi-supine to sitting with mild increased effort    Transfers Overall transfer level: Needs assistance Equipment used: None Transfers: Sit to/from Stand Sit to Stand: Min guard         General transfer comment: fairly strong stand; controlled descent  sitting  Ambulation/Gait Ambulation/Gait assistance: Min guard Gait Distance (Feet):  (30 feet; 60 feet) Assistive device: None Gait Pattern/deviations: Step-through pattern Gait velocity: mildly decreased   General Gait Details: steady ambulation  Stairs            Wheelchair Mobility    Modified Rankin (Stroke Patients Only)       Balance Overall balance assessment: Needs assistance Sitting-balance support: No upper extremity supported;Feet supported Sitting balance-Leahy Scale: Normal Sitting balance - Comments: steady sitting reaching outside BOS   Standing balance support: No upper extremity supported;During functional activity Standing balance-Leahy Scale: Good Standing balance comment: no loss of balance with ambulation                             Pertinent Vitals/Pain  BP 97/74 resting in bed beginning of session; 111/93 sitting edge of bed; and 118/92 post ambulation.  O2 sats WFL on room air during sessions activities.    Home Living Family/patient expects to be discharged to:: Private residence Living Arrangements: Spouse/significant other Available Help at Discharge: Family Type of Home: House Home Access: Stairs to enter Entrance Stairs-Rails: Right;Left;Can reach both Entrance Stairs-Number of Steps: 5 (from back deck) Home Layout: One level [place];Shower seat - built in;Other (comment) Additional Comments: Walking stick    Prior Function Level of Independence: Independent         Comments: Pt reports no recent falls.  Pt reports hobby is hog hunting.     Hand Dominance  Extremity/Trunk Assessment   Upper Extremity Assessment Upper Extremity Assessment: Defer to OT evaluation;Overall [facility] for tasks assessed    Lower Extremity Assessment Lower Extremity Assessment:  (4+/5 B hip flexion; 5/5 B knee flexion/extension and DF/PF; intact B LE light touch, tone, heel to shin coordination,  and proprioception)    Cervical / Trunk Assessment Cervical / Trunk Assessment: Normal  Communication   Communication: No difficulties  Cognition Arousal/Alertness: Awake/alert Behavior During Therapy: WFL for tasks assessed/performed Overall Cognitive Status: Within Functional Limits for tasks assessed                                        General Comments   Nursing cleared pt for participation in physical therapy.  Pt agreeable to PT session.    Exercises     Assessment/Plan    PT Assessment Patient needs continued PT services  PT Problem List Decreased strength;Decreased balance;Decreased activity tolerance;Decreased mobility;Decreased knowledge of precautions;Cardiopulmonary status limiting activity       PT Treatment Interventions DME instruction;Gait training;Stair training;Functional mobility training;Therapeutic activities;Therapeutic exercise;Balance training;Patient/family education    PT Goals (Current goals can be found in the Care Plan section)  Acute Rehab PT Goals Patient Stated Goal: to go home PT Goal Formulation: With patient Time For Goal Achievement: 06/19/20 Potential to Achieve Goals: Good    Frequency Min 2X/week   Barriers to discharge        Co-evaluation               AM-PAC PT "6 Clicks" Mobility  Outcome Measure Help needed turning from your back to your side while in a flat bed without using bedrails?: None Help needed moving from lying on your back to sitting on the side of a flat bed without using bedrails?: None Help needed moving to and from a bed to a chair (including a wheelchair)?: A Little Help needed standing up from a chair using your arms (e.g., wheelchair or bedside chair)?: A Little Help needed to walk in hospital room?: A Little Help needed climbing 3-5 steps with a railing? : A Little 6 Click Score: 20    End of Session Equipment Utilized During Treatment: Gait belt Activity Tolerance: Patient  tolerated treatment well Patient left: with call bell/phone within reach;with bed alarm set;with nursing/sitter in room (sitting on edge of bed with nursing present (giving medications)) Nurse Communication: Mobility status;Precautions;Other (comment) (pt's HR elevation during session) PT Visit Diagnosis: Other abnormalities of gait and mobility (R26.89);Muscle weakness (generalized) (M62.81)    Time: 1224-8250 PT Time Calculation (min) (ACUTE ONLY): 31 min   Charges:   PT Evaluation $PT Eval Low Complexity: 1 Low PT Treatments $Therapeutic Exercise: 8-22 mins       Leitha Bleak, PT 06/05/20, 2:36 PM

## 2020-06-06 DIAGNOSIS — I4821 Permanent atrial fibrillation: Secondary | ICD-10-CM

## 2020-06-06 DIAGNOSIS — E1169 Type 2 diabetes mellitus with other specified complication: Secondary | ICD-10-CM

## 2020-06-06 DIAGNOSIS — I429 Cardiomyopathy, unspecified: Secondary | ICD-10-CM

## 2020-06-06 DIAGNOSIS — Z794 Long term (current) use of insulin: Secondary | ICD-10-CM

## 2020-06-06 LAB — CBC
HCT: 45.7 % (ref 39.0–52.0)
Hemoglobin: 15.7 g/dL (ref 13.0–17.0)
MCH: 28.5 pg (ref 26.0–34.0)
MCHC: 34.4 g/dL (ref 30.0–36.0)
MCV: 82.9 fL (ref 80.0–100.0)
Platelets: 199 10*3/uL (ref 150–400)
RBC: 5.51 MIL/uL (ref 4.22–5.81)
RDW: 13.2 % (ref 11.5–15.5)
WBC: 9.4 10*3/uL (ref 4.0–10.5)
nRBC: 0 % (ref 0.0–0.2)

## 2020-06-06 LAB — BASIC METABOLIC PANEL
Anion gap: 10 (ref 5–15)
BUN: 20 mg/dL (ref 8–23)
CO2: 22 mmol/L (ref 22–32)
Calcium: 8.6 mg/dL — ABNORMAL LOW (ref 8.9–10.3)
Chloride: 104 mmol/L (ref 98–111)
Creatinine, Ser: 1.2 mg/dL (ref 0.61–1.24)
GFR, Estimated: >60 mL/min (ref >60)
Glucose, Bld: 96 mg/dL (ref 70–99)
Potassium: 3.5 mmol/L (ref 3.5–5.1)
Sodium: 136 mmol/L (ref 135–145)

## 2020-06-06 LAB — GLUCOSE, CAPILLARY
Glucose-Capillary: 96 mg/dL (ref 70–99)
Glucose-Capillary: 102 mg/dL — ABNORMAL HIGH (ref 70–99)
Glucose-Capillary: 105 mg/dL — ABNORMAL HIGH (ref 70–99)
Glucose-Capillary: 231 mg/dL — ABNORMAL HIGH (ref 70–99)

## 2020-06-06 LAB — MAGNESIUM: Magnesium: 2.2 mg/dL (ref 1.7–2.4)

## 2020-06-06 LAB — PHOSPHORUS: Phosphorus: 2.7 mg/dL (ref 2.5–4.6)

## 2020-06-06 MED ORDER — APIXABAN 5 MG PO TABS: 5.0000 mg | ORAL_TABLET | Freq: Two times a day (BID) | ORAL | 3 refills | Status: DC

## 2020-06-06 MED ORDER — CARVEDILOL 25 MG PO TABS: 25.0000 mg | ORAL_TABLET | Freq: Two times a day (BID) | ORAL | Status: DC

## 2020-06-06 MED ORDER — AMIODARONE HCL 200 MG PO TABS: 200.0000 mg | ORAL_TABLET | Freq: Two times a day (BID) | ORAL | 2 refills | Status: DC

## 2020-06-06 MED ORDER — PANTOPRAZOLE SODIUM 40 MG PO TBEC: 40.0000 mg | DELAYED_RELEASE_TABLET | Freq: Every day | ORAL | Status: DC

## 2020-06-06 MED ORDER — SACUBITRIL-VALSARTAN 24-26 MG PO TABS: 1.0000 | ORAL_TABLET | Freq: Two times a day (BID) | ORAL | 2 refills | Status: DC

## 2020-06-06 MED ORDER — METFORMIN HCL 500 MG PO TABS: 1500.0000 mg | ORAL_TABLET | Freq: Every day | ORAL | 0 refills | Status: DC

## 2020-06-06 MED ORDER — SACUBITRIL-VALSARTAN 24-26 MG PO TABS
1.0000 | ORAL_TABLET | Freq: Two times a day (BID) | ORAL | Status: DC
  Filled 2020-06-06: qty 1

## 2020-06-06 MED ORDER — ATORVASTATIN CALCIUM 80 MG PO TABS: 80.0000 mg | ORAL_TABLET | Freq: Every day | ORAL | 3 refills | Status: DC

## 2020-06-06 MED ORDER — METFORMIN HCL 500 MG PO TABS: 1500.0000 mg | ORAL_TABLET | Freq: Every day | ORAL | Status: DC

## 2020-06-06 MED ORDER — SPIRONOLACTONE 25 MG PO TABS: 12.5000 mg | ORAL_TABLET | Freq: Every day | ORAL | 3 refills | Status: DC

## 2020-06-06 MED ORDER — CARVEDILOL 25 MG PO TABS: 25.0000 mg | ORAL_TABLET | Freq: Two times a day (BID) | ORAL | 3 refills | Status: DC

## 2020-06-06 NOTE — Progress Notes (Signed)
PHARMACIST - PHYSICIAN COMMUNICATION  CONCERNING: IV to Oral Route Change Policy  RECOMMENDATION: This patient is receiving pantoprazole by the intravenous route.  Based on criteria approved by the Pharmacy and Therapeutics Committee, the intravenous medication(s) is/are being converted to the equivalent oral dose form(s).   DESCRIPTION: These criteria include:  The patient is eating (either orally or via tube) and/or has been taking other orally administered medications for a least 24 hours  The patient has no evidence of active gastrointestinal bleeding or impaired GI absorption (gastrectomy, short bowel, patient on TNA or NPO).  If you have questions about this conversion, please contact the [place] 06/06/2020 1:42 PM

## 2020-06-06 NOTE — Progress Notes (Signed)
Subjective: No further issues, now back to normal. Reports that he became so short of breath 2 months ago, that he actually passed out briefly.   Exam: Vitals:   06/06/20 0700 06/06/20 0800  BP: 101/77 (!) 107/95  Pulse: (!) 34 80  Resp: (!) 22 18  Temp:    SpO2: 96% 94%   Gen: In bed, NAD Resp: non-labored breathing, no acute distress Abd: soft, nt  Neuro: MS: Awake, alert interactive and appropriate EX:HBZJI, VFF, EOMI, mild decrease in left nasolabial folds Motor: he has mild grip weakness on the left.  Sensory: intact to LT   Pertinent Labs: Cr 1.2 LDL 87 A1C 7.4  MRI reviewed - Multiple small areas of infarct  PT/OT - no needs  Impression: 75 yo M with cardioembolic appearing infarcts with new diagnosis cardiomyopath and atrial firbillation. Stroke prevention will consist of anticoagualtion which he seems to be tolerating well, no need for antiplatelet from stroke perspective, but will defer to cardiology if indicated from their perspective.   I advised him to contact the [place] for advice on requirements to return to flight status. Would defer driving decision to hospitalist/cardiology as I suspect his heart was the etiology for his described syncope/SOB.   Recommendations: 1) Continue eliquis for stroke prevention.  2) Continue lipitor(newly started) 3) patient can follow up with outpatient neurology, no further inpatient recommendations at this time.   Roland Rack, MD [facility] [phone]  If 7pm- 7am, please page neurology on call as listed in [place].

## 2020-06-06 NOTE — Discharge Summary (Signed)
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 Anxiety   . Arthritis   . COPD (chronic obstructive pulmonary disease) ([place])   . Diabetes ([place])   . ED (erectile dysfunction)   . Heart murmur   . Hyperlipidemia   . Hypertension   . Melanoma ([place])   . Sleep apnea     HOSPITAL COURSE:  75 year old male presenting to the ED from home with left-sided weakness, slurred speech and right-sided headache.  The patient reported that at approximately 1915 he noticed coffee was dripping out of his mouth onto his chest while he was attempting to drink it.  At the same time his left hand was numb and he was unable to move his fingers.  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  Last known well 06/02/2020 at 1900.   - Telemetry neurology consulted after code stroke initiation in the ED and TPA was administered.   --CTA negative for LVO. -MRI head CT 24 hours post TPA administration shows small areas of acute infarct in the right MCA territory -- patient seen by neurology recommends oral eliquis for anticoagulation. --Echocardiogram showed  LVEF of less than 20%, cardiology consulted --  on statins. --Appreciate PT/OT/ST evaluation-- no PT OT needs. -- Patient will follow-up patient neurology at [facility]. Per neurology patient is advised to contact the [place] for advised on requirement to return to his flight status.  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 Per Dr. Clayborn Bigness no indication for life vest at discharge -- patient advised to follow-up cardiology closely. His advised not to drive given his symptoms of syncope few weeks ago until cleared by cardiology. Wife also voiced understanding regarding these instructions. -- Continue Coreg, spironolactone,enteresto per cardiology recommendation  Type 2 diabetes mellitus with cardiomyopathy -patient resume back on his insulin and oral meds. He follows with Dr. Mickie Kay endocrinology as outpatient recommended to continue doing that.  Hyperlipidemia -- continue statins  COPD OSA-patient states he has a CPAP ordered outpatient but does not wear it Currently on room air. -Bronchodilators as needed -Continue home tiotropium  Anxiety Patient recently prescribed lorazepam 0.5-1 mg daily as needed.    Discussed with neurology and cardiology okay from their standpoint to discharge. Patient and wife are in agreement with plan and are eager and happy to go home.   CONSULTS OBTAINED:    DRUG ALLERGIES:  No Known Allergies  DISCHARGE MEDICATIONS:   Allergies as of 06/06/2020   No Known Allergies     Medication List    STOP  taking these medications   aspirin EC 81 MG tablet   glyBURIDE-metformin 2.5-500 MG tablet Commonly known as: GLUCOVANCE   losartan-hydrochlorothiazide 100-25 MG tablet Commonly known as: HYZAAR   Zinc 50 MG Tabs     TAKE these medications   albuterol 108  (90 Base) MCG/ACT inhaler Commonly known as: VENTOLIN HFA INHALE 2 PUFFS EVERY 4 - 6 HOURS AS NEEDED   amiodarone 200 MG tablet Commonly known as: PACERONE Take 1 tablet (200 mg total) by mouth 2 (two) times daily.   apixaban 5 MG Tabs tablet Commonly known as: ELIQUIS Take 1 tablet (5 mg total) by mouth 2 (two) times daily.   atorvastatin 80 MG tablet Commonly known as: LIPITOR Take 1 tablet (80 mg total) by mouth daily. Start taking on: June 07, 2020   carvedilol 25 MG tablet Commonly known as: COREG Take 1 tablet (25 mg total) by mouth 2 (two) times daily with a meal.   Cinnamon 500 MG capsule Take by mouth.   Farxiga 10 MG Tabs tablet Generic drug: dapagliflozin propanediol Take 10 mg by mouth daily.   fenofibrate 160 MG tablet Take 160 mg by mouth daily.   furosemide 20 MG tablet Commonly known as: LASIX Take 20 mg by mouth daily as needed.   glipiZIDE 5 MG 24 hr tablet Commonly known as: GLUCOTROL XL Take 10 mg by mouth daily. 2 tablets daily   glucose blood test strip USE AS DIRECTED TWICE DAILY   Insulin Pen Needle 32G X 4 MM Misc USE AS DIRECTED   L-Lysine 500 MG Tabs Take 1 tablet by mouth daily.   LORazepam 1 MG tablet Commonly known as: ATIVAN Take 0.5-1 mg by mouth daily as needed. TAKE ONE-HALF (1/2) TO ONE (1) TABLET BY MOUTH DAILY AS NEEDED FOR ANXIETY, PANIC, OR THROAT SWELLING   Magnesium 500 MG Tabs Take 1 tablet by mouth daily.   metFORMIN 500 MG tablet Commonly known as: GLUCOPHAGE Take 3 tablets (1,500 mg total) by mouth daily with breakfast. Start taking on: June 07, 2020   niacin 500 MG tablet Take 500 mg by mouth daily.   PARoxetine 20 MG tablet Commonly known as: PAXIL Take 20 mg by mouth daily.   rOPINIRole 0.5 MG tablet Commonly known as: REQUIP Take 3 tablets by mouth at bedtime.   sacubitril-valsartan 24-26 MG Commonly known as: ENTRESTO Take 1 tablet by mouth 2 (two) times daily.   Saw Palmetto 450 MG  Caps Take 1 capsule by mouth daily.   Soliqua 100-33 UNT-MCG/ML Sopn Generic drug: Insulin Glargine-Lixisenatide Inject 60 Units into the skin in the morning.   spironolactone 25 MG tablet Commonly known as: ALDACTONE Take 0.5 tablets (12.5 mg total) by mouth daily. Start taking on: June 07, 2020   tiotropium 18 MCG inhalation capsule Commonly known as: SPIRIVA Place 18 mcg into inhaler and inhale daily as needed.   vitamin B-12 1000 MCG tablet Commonly known as: CYANOCOBALAMIN Take 1,000 mcg by mouth daily.       If you experience worsening of your admission symptoms, develop shortness of breath, life threatening emergency, suicidal or homicidal thoughts you must seek medical attention immediately by calling 911 or calling your MD immediately  if symptoms less severe.  You Must read complete instructions/literature along with all the possible adverse reactions/side effects for all the Medicines you take and that have been prescribed to you. Take any new Medicines after you have completely understood and accept all the possible adverse reactions/side effects.  Please note  You were cared for by a hospitalist during your hospital stay. If you have any questions about your discharge medications or the care you received while you were in the hospital after you are discharged, you can call the unit and asked to speak with the hospitalist on call if the hospitalist that took care of you is not available. Once you are discharged, your primary care physician will handle any further medical issues. Please note that NO REFILLS for any discharge medications will be authorized once you are discharged, as it is imperative that you return to your primary care physician (or establish a relationship with a primary care physician if you do not have one) for your aftercare needs so that they can reassess your need for medications and monitor your lab values. Today   SUBJECTIVE   Doing well. Wife  at bedside. Denies chest pain or shortness of breath. Overall feels back to near baseline   VITAL SIGNS:  Blood pressure (!) 107/95, pulse 80, temperature 98.9 F (37.2 C), temperature source Oral, resp. rate 16, height 6\' 2"  (1.88 m), weight 118.8 kg, SpO2 93 %.  I/O:    Intake/Output Summary (Last 24 hours) at 06/06/2020 1429 Last data filed at 06/06/2020 1358 Gross per 24 hour  Intake 1040 ml  Output 1450 ml  Net -410 ml    PHYSICAL EXAMINATION:  GENERAL:  75 y.o.-year-old patient lying in the bed with no acute distress.  LUNGS: Normal breath sounds bilaterally, no wheezing, rales,rhonchi or crepitation. No use of accessory muscles of respiration.  CARDIOVASCULAR: S1, S2 normal. No murmurs, rubs, or gallops. Mild tachycardia ABDOMEN: Soft, non-tender, non-distended. Bowel sounds present. No organomegaly or mass.  EXTREMITIES: No pedal edema, cyanosis, or clubbing.  NEUROLOGIC: Cranial nerves II through XII are intact. Muscle strength 5/5 in all extremities. Sensation intact. Gait not checked. Speech clear no facial drill PSYCHIATRIC: patient is alert and oriented x 3.  SKIN: No obvious rash, lesion, or ulcer.   DATA REVIEW:   CBC  Recent Labs  Lab 06/06/20 0451  WBC 9.4  HGB 15.7  HCT 45.7  PLT 199    Chemistries  Recent Labs  Lab 06/02/20 2010 06/03/20 0521 06/06/20 0451  NA 137   < > 136  K 3.3*   < > 3.5  CL 100   < > 104  CO2 24   < > 22  GLUCOSE 142*   < > 96  BUN 23   < > 20  CREATININE 1.47*   < > 1.20  CALCIUM 9.3   < > 8.6*  MG 2.0   < > 2.2  AST 19  --   --   ALT 19  --   --   ALKPHOS 65  --   --   BILITOT 0.5  --   --    < > = values in this interval not displayed.    Microbiology Results   Recent Results (from the past 240 hour(s))  SARS Coronavirus 2 by RT PCR (hospital order, performed in [facility] hospital lab) Nasopharyngeal Nasopharyngeal Swab     Status: None   Collection Time: 06/02/20  8:42 PM   Specimen: Nasopharyngeal Swab   Result Value Ref Range Status   SARS Coronavirus 2 NEGATIVE NEGATIVE Final    Comment: (NOTE) SARS-CoV-2 target nucleic acids are NOT DETECTED.  The SARS-CoV-2 RNA is generally detectable in upper and lower respiratory specimens during the acute phase of infection. The lowest concentration  of SARS-CoV-2 viral copies this assay can detect is 250 copies / mL. A negative result does not preclude SARS-CoV-2 infection and should not be used as the sole basis for treatment or other patient management decisions.  A negative result may occur with improper specimen collection / handling, submission of specimen other than nasopharyngeal swab, presence of viral mutation(s) within the areas targeted by this assay, and inadequate number of viral copies (<250 copies / mL). A negative result must be combined with clinical observations, patient history, and epidemiological information.  Fact Sheet for Patients:   StrictlyIdeas.no  Fact Sheet for Healthcare Providers: BankingDealers.co.za  This test is not yet approved or  cleared by the [place] FDA and has been authorized for detection and/or diagnosis of SARS-CoV-2 by FDA under an Emergency Use Authorization (EUA).  This EUA will remain in effect (meaning this test can be used) for the duration of the COVID-19 declaration under Section 564(b)(1) of the Act, 21 U.S.C. section 360bbb-3(b)(1), unless the authorization is terminated or revoked sooner.  Performed at [facility], [address], [place], [place] [address]     RADIOLOGY:  No results found.   CODE STATUS:     Code Status Orders  (From admission, onward)         Start     Ordered   06/02/20 2141  Full code  Continuous        06/02/20 2148        Code Status History    This patient has a current code status but no historical code status.   Advance Care Planning Activity       TOTAL TIME TAKING CARE  OF THIS PATIENT: 40 minutes.    Fritzi Mandes M.D  [facility]    CC: Primary care physician; [name], MD

## 2020-06-06 NOTE — Progress Notes (Signed)
   06/06/20 1130  Clinical Encounter Type  Visited With Patient and family together  Visit Type Spiritual support;Social support;Initial  Referral From Chaplain  Consult/Referral To [facility] did an initial visit with pt and his wife. Paul Goodman was able to express how happy he was, that he would soon be discharged today. He was able to express how helpful and pleasant  the staff has been towards him at the hospital. Chaplain used reflective listening with pt and his wife. They were able to talk about their strong connection, and their "love story".

## 2020-06-06 NOTE — Progress Notes (Signed)
[facility] Cardiology    SUBJECTIVE: Patient states to be doing reasonably well denies palpitation tachycardia no chest pain has ambulated well with physical therapy denies shortness of breath out of the ordinary at rest patient appears to be stable for discharge blood pressures running a little bit low so we will readjust some of his medications   Vitals:   06/06/20 0500 06/06/20 0600 06/06/20 0700 06/06/20 0800  BP: 90/60 94/77 101/77 (!) 107/95  Pulse: 71 77 (!) 34 80  Resp: (!) 21 (!) 9 (!) 22 18  Temp:      TempSrc:      SpO2: 91% 94% 96% 94%  Weight:      Height:         Intake/Output Summary (Last 24 hours) at 06/06/2020 1339 Last data filed at 06/06/2020 1000 Gross per 24 hour  Intake 560 ml  Output 1000 ml  Net -440 ml      PHYSICAL EXAM  General: Well developed, well nourished, in no acute distress HEENT:  Normocephalic and atramatic Neck:  No JVD.  Lungs: Clear bilaterally to auscultation and percussion. Heart: HRRR . Normal S1 and S2 without gallops or murmurs.  Abdomen: Bowel sounds are positive, abdomen soft and non-tender  Msk:  Back normal, normal gait. Normal strength and tone for age. Extremities: No clubbing, cyanosis or edema.   Neuro: Alert and oriented X 3. Psych:  Good affect, responds appropriately   LABS: Basic Metabolic Panel: Recent Labs    06/05/20 0416 06/06/20 0451  NA 137 136  K 3.4* 3.5  CL 106 104  CO2 23 22  GLUCOSE 86 96  BUN 19 20  CREATININE 1.00 1.20  CALCIUM 8.2* 8.6*  MG 2.1 2.2  PHOS 2.7 2.7   Liver Function Tests: No results for input(s): AST, ALT, ALKPHOS, BILITOT, PROT, ALBUMIN in the last 72 hours. No results for input(s): LIPASE, AMYLASE in the last 72 hours. CBC: Recent Labs    06/05/20 0416 06/06/20 0451  WBC 7.2 9.4  HGB 14.4 15.7  HCT 42.6 45.7  MCV 84.5 82.9  PLT 184 199   Cardiac Enzymes: No results for input(s): CKTOTAL, CKMB, CKMBINDEX, TROPONINI in the last 72 hours. BNP: Invalid input(s):  POCBNP D-Dimer: No results for input(s): DDIMER in the last 72 hours. Hemoglobin A1C: No results for input(s): HGBA1C in the last 72 hours. Fasting Lipid Panel: No results for input(s): CHOL, HDL, LDLCALC, TRIG, CHOLHDL, LDLDIRECT in the last 72 hours. Thyroid Function Tests: No results for input(s): TSH, T4TOTAL, T3FREE, THYROIDAB in the last 72 hours.  Invalid input(s): FREET3 Anemia Panel: No results for input(s): VITAMINB12, FOLATE, FERRITIN, TIBC, IRON, RETICCTPCT in the last 72 hours.  No results found.   Echo severely depressed left ventricular function globally EF less than 25%  TELEMETRY: Atrial fibrillation rate of about 100 nonspecific ST-T wave changes  ASSESSMENT AND PLAN:  Active Problems:   Acute CVA (cerebrovascular accident) ([place]) Atrial fibrillation new Cardiomyopathy severe systolic new Hypertension Obstructive  sleep apnea Hyperlipidemia Obesity Tachycardia Hemiparesis Borderline hypotension . Plan Patient should be okay for discharge We will reduce Entresto to 24/26 twice a day to help with low blood pressure Increased Coreg to 25 twice a day to help with tachycardia Continue spironolactone 12.5 daily Maintain Eliquis 5 mg twice daily for atrial fibrillation anticoagulation Continue CPAP for obstructive sleep apnea Continue amiodarone 200 twice a day for at least a week then reduce to 200 once a day Have the patient follow-up with physical  therapy Have the patient follow-up with cardiology 1 to 2 weeks upon discharge  Medications for discharge Amiodarone 200 twice a day x1 week then 200 a day Eliquis 5 mg twice a day Reduce Entresto to 24/26 twice a day Spironolactone 12.5 mg po qd Coreg 25 mg twice a day Lipitor 80 mg daily Farxiga 10 mg daily Aspirin 81 mg a day if okay with neurology  Yolonda Kida, MD 06/06/2020 1:39 PM

## 2021-02-12 ENCOUNTER — Emergency Department: Payer: Federal, State, Local not specified - PPO

## 2021-02-12 ENCOUNTER — Other Ambulatory Visit: Payer: Self-pay

## 2021-02-12 ENCOUNTER — Emergency Department
Admission: EM | Admit: 2021-02-12 | Discharge: 2021-02-12 | Disposition: A | Discharge status: Home / Self Care | Payer: Federal, State, Local not specified - PPO | Attending: Emergency Medicine | Admitting: Emergency Medicine

## 2021-02-12 DIAGNOSIS — J449 Chronic obstructive pulmonary disease, unspecified: Secondary | ICD-10-CM | POA: Insufficient documentation

## 2021-02-12 DIAGNOSIS — Z794 Long term (current) use of insulin: Secondary | ICD-10-CM | POA: Diagnosis not present

## 2021-02-12 DIAGNOSIS — I110 Hypertensive heart disease with heart failure: Secondary | ICD-10-CM | POA: Diagnosis not present

## 2021-02-12 DIAGNOSIS — Z7901 Long term (current) use of anticoagulants: Secondary | ICD-10-CM | POA: Insufficient documentation

## 2021-02-12 DIAGNOSIS — Z7984 Long term (current) use of oral hypoglycemic drugs: Secondary | ICD-10-CM | POA: Diagnosis not present

## 2021-02-12 DIAGNOSIS — Z87891 Personal history of nicotine dependence: Secondary | ICD-10-CM | POA: Diagnosis not present

## 2021-02-12 DIAGNOSIS — L03115 Cellulitis of right lower limb: Secondary | ICD-10-CM

## 2021-02-12 DIAGNOSIS — S8991XA Unspecified injury of right lower leg, initial encounter: Secondary | ICD-10-CM | POA: Insufficient documentation

## 2021-02-12 DIAGNOSIS — E119 Type 2 diabetes mellitus without complications: Secondary | ICD-10-CM | POA: Insufficient documentation

## 2021-02-12 DIAGNOSIS — I509 Heart failure, unspecified: Secondary | ICD-10-CM | POA: Insufficient documentation

## 2021-02-12 DIAGNOSIS — W19XXXA Unspecified fall, initial encounter: Secondary | ICD-10-CM | POA: Diagnosis not present

## 2021-02-12 DIAGNOSIS — I4821 Permanent atrial fibrillation: Secondary | ICD-10-CM | POA: Diagnosis not present

## 2021-02-12 HISTORY — DX: Heart failure, unspecified: I50.9

## 2021-02-12 LAB — CBC WITH DIFFERENTIAL/PLATELET
Abs Immature Granulocytes: 0.04 10*3/uL (ref 0.00–0.07)
Basophils Absolute: 0 10*3/uL (ref 0.0–0.1)
Basophils Relative: 0 %
Eosinophils Absolute: 0 10*3/uL (ref 0.0–0.5)
Eosinophils Relative: 0 %
HCT: 39.2 % (ref 39.0–52.0)
Hemoglobin: 13.9 g/dL (ref 13.0–17.0)
Immature Granulocytes: 0 %
Lymphocytes Relative: 10 %
Lymphs Abs: 1.2 10*3/uL (ref 0.7–4.0)
MCH: 31.7 pg (ref 26.0–34.0)
MCHC: 35.5 g/dL (ref 30.0–36.0)
MCV: 89.3 fL (ref 80.0–100.0)
Monocytes Absolute: 1 10*3/uL (ref 0.1–1.0)
Monocytes Relative: 9 %
Neutro Abs: 9.1 10*3/uL — ABNORMAL HIGH (ref 1.7–7.7)
Neutrophils Relative %: 81 %
Platelets: 183 10*3/uL (ref 150–400)
RBC: 4.39 MIL/uL (ref 4.22–5.81)
RDW: 12.8 % (ref 11.5–15.5)
WBC: 11.4 10*3/uL — ABNORMAL HIGH (ref 4.0–10.5)
nRBC: 0 % (ref 0.0–0.2)

## 2021-02-12 LAB — CBG MONITORING, ED: Glucose-Capillary: 264 mg/dL — ABNORMAL HIGH (ref 70–99)

## 2021-02-12 LAB — COMPREHENSIVE METABOLIC PANEL
ALT: 17 U/L (ref 0–44)
AST: 20 U/L (ref 15–41)
Albumin: 3.4 g/dL — ABNORMAL LOW (ref 3.5–5.0)
Alkaline Phosphatase: 67 U/L (ref 38–126)
Anion gap: 5 (ref 5–15)
BUN: 20 mg/dL (ref 8–23)
CO2: 26 mmol/L (ref 22–32)
Calcium: 8.6 mg/dL — ABNORMAL LOW (ref 8.9–10.3)
Chloride: 101 mmol/L (ref 98–111)
Creatinine, Ser: 1.21 mg/dL (ref 0.61–1.24)
GFR, Estimated: >60 mL/min (ref >60)
Glucose, Bld: 306 mg/dL — ABNORMAL HIGH (ref 70–99)
Potassium: 4.3 mmol/L (ref 3.5–5.1)
Sodium: 132 mmol/L — ABNORMAL LOW (ref 135–145)
Total Bilirubin: 0.7 mg/dL (ref 0.3–1.2)
Total Protein: 7 g/dL (ref 6.5–8.1)

## 2021-02-12 LAB — URIC ACID: Uric Acid, Serum: 4.3 mg/dL (ref 3.7–8.6)

## 2021-02-12 MED ORDER — INSULIN ASPART 100 UNIT/ML IJ SOLN
0.0000 [IU] | INTRAMUSCULAR | Status: DC
  Administered 2021-02-12: 8 [IU] via SUBCUTANEOUS
  Filled 2021-02-12: qty 1

## 2021-02-12 MED ORDER — OXYCODONE-ACETAMINOPHEN 7.5-325 MG PO TABS: 1.0000 | ORAL_TABLET | Freq: Four times a day (QID) | ORAL | 0 refills | Status: AC | PRN

## 2021-02-12 MED ORDER — MORPHINE SULFATE (PF) 4 MG/ML IV SOLN
4.0000 mg | Freq: Once | INTRAVENOUS | Status: AC
  Administered 2021-02-12: 4 mg via INTRAVENOUS
  Filled 2021-02-12: qty 1

## 2021-02-12 MED ORDER — SODIUM CHLORIDE 0.9 % IV SOLN
1.0000 g | Freq: Once | INTRAVENOUS | Status: AC
  Administered 2021-02-12: 1 g via INTRAVENOUS
  Filled 2021-02-12: qty 10

## 2021-02-12 MED ORDER — DOXYCYCLINE HYCLATE 100 MG PO TABS
100.0000 mg | ORAL_TABLET | Freq: Once | ORAL | Status: AC
  Administered 2021-02-12: 100 mg via ORAL
  Filled 2021-02-12: qty 1

## 2021-02-12 MED ORDER — DOXYCYCLINE MONOHYDRATE 100 MG PO CAPS: 100.0000 mg | ORAL_CAPSULE | Freq: Two times a day (BID) | ORAL | 0 refills | Status: AC

## 2021-02-12 NOTE — ED Triage Notes (Signed)
Pt states he was helping a friend and went to bend down to do something and came down hard on a rock to the right knee and has been having pain since.

## 2021-02-12 NOTE — ED Provider Notes (Signed)
[facility] Emergency Department Provider Note   ____________________________________________   Event Date/Time   First MD Initiated Contact with Patient 02/12/21 1003     (approximate)  I have reviewed the triage vital signs and the nursing notes.   HISTORY  Chief Complaint Knee Injury    HPI Paul Goodman is a 75 y.o. male patient reports to the emergency room with report of fall on gravel 3 days ago injuring his right knee.  Patient reports that he continued with the task he was in the process of and then went home to later noticed that his knee was red swollen and painful.  Patient reports that throughout the day he was unable to bear weight on the right knee.  Patient "borrowed "some of his friends oxycodone.  He then went home and took 3.  Stating that he has high tolerance to pain.  He reports that evening he had to go to bed and since then has only gotten up to go to the bathroom having to use a cane for ambulation.  He reports the pain has gotten increasingly worse it is 10 out of 10 sharp, stabbing, burning and constant nature.  Patient reports he has used ice packs to the area without relief.  Patient reports that over the past 2 days he has taken an entire bottle of ibuprofen, 6 oxycodone, and all the gabapentin that he has left at home. Patient is on Eliquis.  Patient reports he has not taken any diabetic medicine for the past 3 days.   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   Anxiety 01/14/2017   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/2017   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 Authorizing Provider  doxycycline (MONODOX) 100 MG capsule Take 1 capsule (100 mg total) by mouth 2 (two) times daily for 10 days. 02/12/21 02/22/21 Yes Willaim Rayas, NP  oxyCODONE-acetaminophen (PERCOCET) 7.5-325 MG tablet Take 1 tablet by mouth every 6 (six) hours as needed for up to 3 days for severe pain. 02/12/21 02/15/21 Yes Willaim Rayas, NP  albuterol (PROVENTIL HFA;VENTOLIN HFA) 108 (90 BASE) MCG/ACT inhaler INHALE 2 PUFFS EVERY 4 - 6 HOURS AS NEEDED 06/07/14   [provider]  amiodarone (PACERONE) 200 MG tablet Take 1 tablet (200 mg total)  by mouth 2 (two) times daily. 06/06/20   Fritzi Mandes, MD  apixaban (ELIQUIS) 5 MG TABS tablet Take 1 tablet (5 mg total) by mouth 2 (two) times daily. 06/06/20   Fritzi Mandes, MD  atorvastatin (LIPITOR) 80 MG tablet Take 1 tablet (80 mg total) by mouth daily. 06/07/20   Fritzi Mandes, MD  carvedilol (COREG) 25 MG tablet Take 1 tablet (25 mg total) by mouth 2 (two) times daily with a meal. 06/06/20   Fritzi Mandes, MD  Cinnamon 500 MG capsule Take by mouth.    [provider]  FARXIGA 10 MG TABS tablet Take 10 mg by mouth daily. 05/20/20   [provider]  fenofibrate 160 MG tablet Take 160 mg by mouth daily.  06/16/14   [provider]  furosemide (LASIX) 20 MG tablet Take 20 mg by mouth daily as needed. 12/15/18   [provider]  glipiZIDE (GLUCOTROL XL) 5 MG 24 hr tablet Take 10 mg by mouth daily. 2 tablets daily 09/17/14   [provider]  glucose blood test strip USE AS DIRECTED TWICE DAILY 11/09/15   [provider]  Insulin Pen Needle 32G X 4 MM MISC USE AS DIRECTED 11/09/15   [provider]  L-Lysine 500 MG TABS Take 1 tablet by mouth daily.    [provider]  LORazepam (ATIVAN) 1 MG tablet Take 0.5-1 mg by mouth daily as needed. TAKE ONE-HALF (1/2) TO ONE (1) TABLET BY MOUTH DAILY AS NEEDED FOR ANXIETY, PANIC, OR THROAT SWELLING 05/30/20   [provider]  Magnesium 500 MG TABS Take 1 tablet by mouth daily.    [provider]  metFORMIN (GLUCOPHAGE) 500 MG tablet Take 3 tablets (1,500 mg total) by mouth daily with breakfast. 06/07/20   Fritzi Mandes, MD  niacin 500 MG tablet Take 500 mg by mouth daily.    [provider]  PARoxetine (PAXIL) 20 MG tablet Take 20 mg by mouth daily. 12/07/13   [provider]  rOPINIRole (REQUIP) 0.5 MG tablet Take 3 tablets by mouth at bedtime. 09/10/14   [provider]  sacubitril-valsartan (ENTRESTO) 24-26 MG Take 1 tablet by mouth 2 (two) times daily. 06/06/20   Fritzi Mandes, MD  Saw Palmetto 450 MG CAPS Take 1 capsule by mouth daily.    [provider]  SOLIQUA 100-33 UNT-MCG/ML SOPN Inject 60 Units into the skin in the morning. 11/11/18   [provider]  spironolactone (ALDACTONE) 25 MG tablet Take 0.5 tablets (12.5 mg total) by mouth daily. 06/07/20   Fritzi Mandes, MD  tiotropium (SPIRIVA) 18 MCG inhalation capsule Place 18 mcg into inhaler and inhale daily as needed. 03/23/16   [provider]  vitamin B-12 (CYANOCOBALAMIN) 1000 MCG tablet Take 1,000 mcg by mouth daily.    [provider]    Allergies Patient has no known  allergies.  Family History  Problem Relation Age of Onset   Kidney Stones Father    Kidney disease Neg Hx    Prostate cancer Neg Hx    Kidney cancer Neg Hx    Bladder Cancer Neg Hx     Social History Social History   Tobacco Use   Smoking status: Former    Packs/day: 0.00    Types: Cigarettes   Smokeless tobacco: Former   Tobacco comments:    quit 1992  Vaping Use   Vaping Use: Never used  Substance Use Topics   Alcohol use: Not Currently    Alcohol/week: 14.0  standard drinks    Types: 14 Standard drinks or equivalent per week   Drug use: Yes    Types: Marijuana    Review of Systems  Constitutional: No fever/chills Eyes: No visual changes. ENT: No sore throat. Cardiovascular: Denies chest pain.  Positive for congestive heart failure. Respiratory: Denies shortness of breath.  Positive for COPD. Gastrointestinal: No abdominal pain.  No nausea, no vomiting.  No diarrhea.  No constipation. Genitourinary: Negative for dysuria. Musculoskeletal: Negative for back pain.  Positive for right knee pain. Skin: Negative for rash.  Positive for redness, warmth, swelling. Neurological: Negative for headaches, focal weakness or numbness. Endocrine: Positive for diabetes.   ____________________________________________   PHYSICAL EXAM:  VITAL SIGNS: ED Triage Vitals  Enc Vitals Group     BP 02/12/21 0931 127/69     Pulse Rate 02/12/21 0931 79     Resp 02/12/21 0931 20     Temp 02/12/21 0931 98.2 F (36.8 C)     Temp Source 02/12/21 0931 Oral     SpO2 02/12/21 0931 96 %     Weight 02/12/21 0931 261 lb 14.5 oz (118.8 kg)     Height 02/12/21 0931 6\' 2"  (1.88 m)     Head Circumference --      Peak Flow --      Pain Score 02/12/21 0924 10     Pain Loc --      Pain Edu? --      Excl. in [place]? --     Constitutional: Alert and oriented. Well appearing and in no acute distress. Eyes: Conjunctivae are normal. PERRL. EOMI. Head: Atraumatic. Cardiovascular: Normal rate,  regular rhythm. Grossly normal heart sounds.  Good peripheral circulation. Respiratory: Normal respiratory effort.  No retractions. Lungs CTAB. Gastrointestinal: Soft and nontender. No distention. No abdominal bruits. No CVA tenderness. Musculoskeletal: Patient's right knee is red, swollen, painful to touch, decreased range of motion.  Pain extends midway up medial thigh and extends into right shin.  Pedal pulses are normal.  Sensation intact. Neurologic:  Normal speech and language. No gross focal neurologic deficits are appreciated. No gait instability. Skin:  Skin is warm, dry and intact. No rash noted.  Patient has multiple scrapes and abrasions to bilateral lower legs from previous injury approximately 1 week ago.Marland Kitchen  No infection at this time. Psychiatric: Mood and affect are normal. Speech and behavior are normal.  ____________________________________________   LABS (all labs ordered are listed, but only abnormal results are displayed)  Labs Reviewed  COMPREHENSIVE METABOLIC PANEL - Abnormal; Notable for the following components:      Result Value   Sodium 132 (*)    Glucose, Bld 306 (*)    Calcium 8.6 (*)    Albumin 3.4 (*)    All other components within normal limits  CBC WITH DIFFERENTIAL/PLATELET - Abnormal; Notable for the following components:   WBC 11.4 (*)    Neutro Abs 9.1 (*)    All other components within normal limits  CBG MONITORING, ED - Abnormal; Notable for the following components:   Glucose-Capillary 264 (*)    All other components within normal limits  CULTURE, BLOOD (SINGLE)  URIC ACID  HEMOGLOBIN A1C   ____________________________________________  EKG   ____________________________________________  RADIOLOGY  ED MD interpretation:    Official radiology report(s): DG Knee Complete 4 Views Right  Result Date: 02/12/2021 CLINICAL DATA:  Status post injury with right knee pain. EXAM: RIGHT KNEE - COMPLETE 4+ VIEW COMPARISON:  None. FINDINGS: No  evidence of fracture, dislocation. Small suprapatellar effusion is noted. Soft tissues are unremarkable. IMPRESSION: No acute fracture or dislocation. Electronically Signed   By: Abelardo Diesel M.D.   On: 02/12/2021 10:07    ____________________________________________   PROCEDURES  Procedure(s) performed: None  Procedures  Critical Care performed: No  ____________________________________________   INITIAL IMPRESSION / ASSESSMENT AND PLAN / ED COURSE     Patient presents to the emergency room after a fall 2 days ago.  Patient reports that he fell on a rock striking the medial aspect of right knee.  See HPI for full history.  Patient's right knee is red, swollen, warm, painful.  Pain/redness/warmth extends midway up right thigh.  Pain also extends into shin.  Patient has limited range of motion to the right knee.  Right knee x-ray obtained and is negative for fracture or dislocation.  Will obtain IV and CBC/CMP/uric acid. Patient also given morphine for pain. CBC came back with slightly elevated white blood count at 11.4 does not meet septic criteria.  BMP is unremarkable.  Uric acid is negative. Patient was also evaluated by Dr. Hulan Saas.  Upon Dr. Thompson Caul recommendations patient will receive IV Rocephin in the emergency room and be discharged on doxycycline for 10 days.  Patient will also be given Percocet for the next 3 days with strict return policy is to follow-up with primary care doctor Monday or Tuesday.  Patient and his spouse are informed.  Redness extends past markings so will be applied emergency room today that he should return back to the emergency room immediately.  He is also to return if he starts with fever, chills, increased pain, warmth.  Patient will be given crutches in the emergency room as he needs to be out of bed and ambulating.  Patient agrees with plan of care and states that he does understand strict return patient.       ____________________________________________   FINAL CLINICAL IMPRESSION(S) / ED DIAGNOSES  Final diagnoses:  Cellulitis of right lower leg     ED Discharge Orders          Ordered    doxycycline (MONODOX) 100 MG capsule  2 times daily        02/12/21 1159    oxyCODONE-acetaminophen (PERCOCET) 7.5-325 MG tablet  Every 6 hours PRN        02/12/21 1159    Walker standard        02/12/21 1224             Note:  This document was prepared using Dragon voice recognition software and may include unintentional dictation errors.     Willaim Rayas, NP 02/12/21 1358    Lucrezia Starch, MD 02/12/21 [phone]

## 2021-02-12 NOTE — ED Notes (Signed)
Lab called to add on labs

## 2021-02-12 NOTE — ED Notes (Signed)
See triage note  was working in the yard about 1 week ago  developed pain to right knee   then this weekend he was helping someone under a trailer  hit a rock  right knee red and swollen

## 2021-02-13 LAB — HEMOGLOBIN A1C
Hgb A1c MFr Bld: 8.7 % — ABNORMAL HIGH (ref 4.8–5.6)
Mean Plasma Glucose: 203 mg/dL

## 2021-02-17 LAB — CULTURE, BLOOD (SINGLE): Culture: NO GROWTH

## 2021-02-25 ENCOUNTER — Emergency Department: Payer: Medicare Other

## 2021-02-25 ENCOUNTER — Inpatient Hospital Stay
Admission: EM | Admit: 2021-02-25 | Discharge: 2021-02-27 | DRG: 558 | Disposition: A | Discharge status: Home / Self Care | Payer: Medicare Other | Attending: Internal Medicine | Admitting: Internal Medicine

## 2021-02-25 ENCOUNTER — Encounter: Payer: Self-pay | Admitting: Emergency Medicine

## 2021-02-25 ENCOUNTER — Other Ambulatory Visit: Payer: Self-pay

## 2021-02-25 DIAGNOSIS — E1129 Type 2 diabetes mellitus with other diabetic kidney complication: Secondary | ICD-10-CM | POA: Diagnosis present

## 2021-02-25 DIAGNOSIS — M1711 Unilateral primary osteoarthritis, right knee: Secondary | ICD-10-CM | POA: Diagnosis present

## 2021-02-25 DIAGNOSIS — E1122 Type 2 diabetes mellitus with diabetic chronic kidney disease: Secondary | ICD-10-CM | POA: Diagnosis present

## 2021-02-25 DIAGNOSIS — Z9989 Dependence on other enabling machines and devices: Secondary | ICD-10-CM | POA: Diagnosis not present

## 2021-02-25 DIAGNOSIS — Z79899 Other long term (current) drug therapy: Secondary | ICD-10-CM

## 2021-02-25 DIAGNOSIS — Z7984 Long term (current) use of oral hypoglycemic drugs: Secondary | ICD-10-CM

## 2021-02-25 DIAGNOSIS — I482 Chronic atrial fibrillation, unspecified: Secondary | ICD-10-CM | POA: Diagnosis present

## 2021-02-25 DIAGNOSIS — Z8673 Personal history of transient ischemic attack (TIA), and cerebral infarction without residual deficits: Secondary | ICD-10-CM

## 2021-02-25 DIAGNOSIS — I2510 Atherosclerotic heart disease of native coronary artery without angina pectoris: Secondary | ICD-10-CM | POA: Diagnosis present

## 2021-02-25 DIAGNOSIS — S92314A Nondisplaced fracture of first metatarsal bone, right foot, initial encounter for closed fracture: Secondary | ICD-10-CM

## 2021-02-25 DIAGNOSIS — S92311A Displaced fracture of first metatarsal bone, right foot, initial encounter for closed fracture: Secondary | ICD-10-CM | POA: Diagnosis present

## 2021-02-25 DIAGNOSIS — F32A Depression, unspecified: Secondary | ICD-10-CM | POA: Diagnosis present

## 2021-02-25 DIAGNOSIS — F418 Other specified anxiety disorders: Secondary | ICD-10-CM | POA: Diagnosis present

## 2021-02-25 DIAGNOSIS — I130 Hypertensive heart and chronic kidney disease with heart failure and stage 1 through stage 4 chronic kidney disease, or unspecified chronic kidney disease: Secondary | ICD-10-CM | POA: Diagnosis present

## 2021-02-25 DIAGNOSIS — I5022 Chronic systolic (congestive) heart failure: Secondary | ICD-10-CM | POA: Diagnosis present

## 2021-02-25 DIAGNOSIS — G4733 Obstructive sleep apnea (adult) (pediatric): Secondary | ICD-10-CM | POA: Diagnosis present

## 2021-02-25 DIAGNOSIS — Z7901 Long term (current) use of anticoagulants: Secondary | ICD-10-CM

## 2021-02-25 DIAGNOSIS — N400 Enlarged prostate without lower urinary tract symptoms: Secondary | ICD-10-CM | POA: Diagnosis present

## 2021-02-25 DIAGNOSIS — Z20822 Contact with and (suspected) exposure to covid-19: Secondary | ICD-10-CM | POA: Diagnosis present

## 2021-02-25 DIAGNOSIS — Z87891 Personal history of nicotine dependence: Secondary | ICD-10-CM | POA: Diagnosis not present

## 2021-02-25 DIAGNOSIS — L03115 Cellulitis of right lower limb: Secondary | ICD-10-CM | POA: Diagnosis present

## 2021-02-25 DIAGNOSIS — F419 Anxiety disorder, unspecified: Secondary | ICD-10-CM | POA: Diagnosis present

## 2021-02-25 DIAGNOSIS — J449 Chronic obstructive pulmonary disease, unspecified: Secondary | ICD-10-CM | POA: Diagnosis present

## 2021-02-25 DIAGNOSIS — Z794 Long term (current) use of insulin: Secondary | ICD-10-CM

## 2021-02-25 DIAGNOSIS — N182 Chronic kidney disease, stage 2 (mild): Secondary | ICD-10-CM | POA: Diagnosis present

## 2021-02-25 DIAGNOSIS — W208XXA Other cause of strike by thrown, projected or falling object, initial encounter: Secondary | ICD-10-CM | POA: Diagnosis present

## 2021-02-25 DIAGNOSIS — S92301A Fracture of unspecified metatarsal bone(s), right foot, initial encounter for closed fracture: Secondary | ICD-10-CM | POA: Diagnosis not present

## 2021-02-25 DIAGNOSIS — R58 Hemorrhage, not elsewhere classified: Secondary | ICD-10-CM | POA: Diagnosis present

## 2021-02-25 DIAGNOSIS — M7989 Other specified soft tissue disorders: Secondary | ICD-10-CM | POA: Diagnosis present

## 2021-02-25 DIAGNOSIS — E785 Hyperlipidemia, unspecified: Secondary | ICD-10-CM | POA: Diagnosis present

## 2021-02-25 DIAGNOSIS — R110 Nausea: Secondary | ICD-10-CM | POA: Diagnosis present

## 2021-02-25 DIAGNOSIS — M71161 Other infective bursitis, right knee: Secondary | ICD-10-CM | POA: Diagnosis present

## 2021-02-25 DIAGNOSIS — I10 Essential (primary) hypertension: Secondary | ICD-10-CM | POA: Diagnosis present

## 2021-02-25 DIAGNOSIS — M7041 Prepatellar bursitis, right knee: Principal | ICD-10-CM | POA: Diagnosis present

## 2021-02-25 DIAGNOSIS — Z23 Encounter for immunization: Secondary | ICD-10-CM | POA: Diagnosis present

## 2021-02-25 DIAGNOSIS — S80211A Abrasion, right knee, initial encounter: Secondary | ICD-10-CM | POA: Diagnosis present

## 2021-02-25 DIAGNOSIS — L089 Local infection of the skin and subcutaneous tissue, unspecified: Secondary | ICD-10-CM | POA: Diagnosis not present

## 2021-02-25 LAB — CBC WITH DIFFERENTIAL/PLATELET
Abs Immature Granulocytes: 0.12 10*3/uL — ABNORMAL HIGH (ref 0.00–0.07)
Basophils Absolute: 0.1 10*3/uL (ref 0.0–0.1)
Basophils Relative: 0 %
Eosinophils Absolute: 0.1 10*3/uL (ref 0.0–0.5)
Eosinophils Relative: 1 %
HCT: 39 % (ref 39.0–52.0)
Hemoglobin: 13.2 g/dL (ref 13.0–17.0)
Immature Granulocytes: 1 %
Lymphocytes Relative: 15 %
Lymphs Abs: 1.9 10*3/uL (ref 0.7–4.0)
MCH: 29.8 pg (ref 26.0–34.0)
MCHC: 33.8 g/dL (ref 30.0–36.0)
MCV: 88 fL (ref 80.0–100.0)
Monocytes Absolute: 0.8 10*3/uL (ref 0.1–1.0)
Monocytes Relative: 7 %
Neutro Abs: 9.4 10*3/uL — ABNORMAL HIGH (ref 1.7–7.7)
Neutrophils Relative %: 76 %
Platelets: 388 10*3/uL (ref 150–400)
RBC: 4.43 MIL/uL (ref 4.22–5.81)
RDW: 12.9 % (ref 11.5–15.5)
WBC: 12.4 10*3/uL — ABNORMAL HIGH (ref 4.0–10.5)
nRBC: 0 % (ref 0.0–0.2)

## 2021-02-25 LAB — PROTIME-INR
INR: 1.2 (ref 0.8–1.2)
Prothrombin Time: 15.2 seconds (ref 11.4–15.2)

## 2021-02-25 LAB — COMPREHENSIVE METABOLIC PANEL
ALT: 22 U/L (ref 0–44)
AST: 17 U/L (ref 15–41)
Albumin: 3.4 g/dL — ABNORMAL LOW (ref 3.5–5.0)
Alkaline Phosphatase: 84 U/L (ref 38–126)
Anion gap: 8 (ref 5–15)
BUN: 27 mg/dL — ABNORMAL HIGH (ref 8–23)
CO2: 27 mmol/L (ref 22–32)
Calcium: 8.8 mg/dL — ABNORMAL LOW (ref 8.9–10.3)
Chloride: 97 mmol/L — ABNORMAL LOW (ref 98–111)
Creatinine, Ser: 1.36 mg/dL — ABNORMAL HIGH (ref 0.61–1.24)
GFR, Estimated: 55 mL/min — ABNORMAL LOW (ref >60)
Glucose, Bld: 299 mg/dL — ABNORMAL HIGH (ref 70–99)
Potassium: 4.8 mmol/L (ref 3.5–5.1)
Sodium: 132 mmol/L — ABNORMAL LOW (ref 135–145)
Total Bilirubin: 0.5 mg/dL (ref 0.3–1.2)
Total Protein: 7.6 g/dL (ref 6.5–8.1)

## 2021-02-25 LAB — LACTIC ACID, PLASMA
Lactic Acid, Venous: 2.3 mmol/L (ref 0.5–1.9)
Lactic Acid, Venous: 1.2 mmol/L (ref 0.5–1.9)

## 2021-02-25 LAB — BRAIN NATRIURETIC PEPTIDE: B Natriuretic Peptide: 114.9 pg/mL — ABNORMAL HIGH (ref 0.0–100.0)

## 2021-02-25 LAB — GLUCOSE, CAPILLARY
Glucose-Capillary: 133 mg/dL — ABNORMAL HIGH (ref 70–99)
Glucose-Capillary: 130 mg/dL — ABNORMAL HIGH (ref 70–99)

## 2021-02-25 LAB — SEDIMENTATION RATE: Sed Rate: 58 mm/hr — ABNORMAL HIGH (ref 0–20)

## 2021-02-25 LAB — CK: Total CK: 44 U/L — ABNORMAL LOW (ref 49–397)

## 2021-02-25 LAB — C-REACTIVE PROTEIN: CRP: 1.7 mg/dL — ABNORMAL HIGH (ref <1.0)

## 2021-02-25 LAB — RESP PANEL BY RT-PCR (FLU A&B, COVID) ARPGX2
Influenza A by PCR: NEGATIVE
Influenza B by PCR: NEGATIVE
SARS Coronavirus 2 by RT PCR: NEGATIVE

## 2021-02-25 LAB — APTT: aPTT: 30 seconds (ref 24–36)

## 2021-02-25 MED ORDER — INSULIN GLARGINE 100 UNIT/ML ~~LOC~~ SOLN
40.0000 [IU] | Freq: Every day | SUBCUTANEOUS | Status: DC
  Administered 2021-02-26 – 2021-02-27 (×2): 40 [IU] via SUBCUTANEOUS
  Filled 2021-02-25 (×3): qty 0.4

## 2021-02-25 MED ORDER — ALBUTEROL SULFATE HFA 108 (90 BASE) MCG/ACT IN AERS
2.0000 | INHALATION_SPRAY | RESPIRATORY_TRACT | Status: DC | PRN
  Filled 2021-02-25: qty 6.7

## 2021-02-25 MED ORDER — INFLUENZA VAC A&B SA ADJ QUAD 0.5 ML IM PRSY
0.5000 mL | PREFILLED_SYRINGE | INTRAMUSCULAR | Status: DC
  Filled 2021-02-25: qty 0.5

## 2021-02-25 MED ORDER — VANCOMYCIN HCL IN DEXTROSE 1-5 GM/200ML-% IV SOLN
1000.0000 mg | Freq: Once | INTRAVENOUS | Status: AC
  Administered 2021-02-25: 1000 mg via INTRAVENOUS
  Filled 2021-02-25: qty 200

## 2021-02-25 MED ORDER — ATORVASTATIN CALCIUM 20 MG PO TABS
80.0000 mg | ORAL_TABLET | Freq: Every day | ORAL | Status: DC
  Administered 2021-02-25 – 2021-02-27 (×3): 80 mg via ORAL
  Filled 2021-02-25 (×2): qty 4

## 2021-02-25 MED ORDER — LACTATED RINGERS IV SOLN: INTRAVENOUS | Status: DC

## 2021-02-25 MED ORDER — MORPHINE SULFATE (PF) 2 MG/ML IV SOLN: 1.0000 mg | INTRAVENOUS | Status: DC | PRN

## 2021-02-25 MED ORDER — HYDRALAZINE HCL 20 MG/ML IJ SOLN: 5.0000 mg | INTRAMUSCULAR | Status: DC | PRN

## 2021-02-25 MED ORDER — AMIODARONE HCL 200 MG PO TABS
200.0000 mg | ORAL_TABLET | Freq: Two times a day (BID) | ORAL | Status: DC
  Administered 2021-02-25 – 2021-02-27 (×4): 200 mg via ORAL
  Filled 2021-02-25 (×5): qty 1

## 2021-02-25 MED ORDER — VANCOMYCIN HCL 1500 MG/300ML IV SOLN
1500.0000 mg | Freq: Once | INTRAVENOUS | Status: AC
  Administered 2021-02-25: 1500 mg via INTRAVENOUS
  Filled 2021-02-25: qty 300

## 2021-02-25 MED ORDER — VANCOMYCIN HCL 1250 MG/250ML IV SOLN
1250.0000 mg | INTRAVENOUS | Status: DC
  Filled 2021-02-25: qty 250

## 2021-02-25 MED ORDER — CHLORHEXIDINE GLUCONATE 0.12 % MT SOLN
15.0000 mL | Freq: Two times a day (BID) | OROMUCOSAL | Status: DC
  Administered 2021-02-26 – 2021-02-27 (×3): 15 mL via OROMUCOSAL
  Filled 2021-02-25 (×4): qty 15

## 2021-02-25 MED ORDER — DM-GUAIFENESIN ER 30-600 MG PO TB12
1.0000 | ORAL_TABLET | Freq: Two times a day (BID) | ORAL | Status: DC | PRN
  Administered 2021-02-26: 1 via ORAL
  Filled 2021-02-25 (×2): qty 1

## 2021-02-25 MED ORDER — SODIUM CHLORIDE 0.9 % IV BOLUS (SEPSIS)
2000.0000 mL | Freq: Once | INTRAVENOUS | Status: AC
  Administered 2021-02-25: 2000 mL via INTRAVENOUS

## 2021-02-25 MED ORDER — OXYCODONE-ACETAMINOPHEN 5-325 MG PO TABS
1.0000 | ORAL_TABLET | ORAL | Status: DC | PRN
  Administered 2021-02-25 – 2021-02-26 (×2): 1 via ORAL
  Filled 2021-02-25 (×2): qty 1

## 2021-02-25 MED ORDER — TIOTROPIUM BROMIDE MONOHYDRATE 18 MCG IN CAPS
18.0000 ug | ORAL_CAPSULE | Freq: Every day | RESPIRATORY_TRACT | Status: DC
  Administered 2021-02-25 – 2021-02-27 (×3): 18 ug via RESPIRATORY_TRACT
  Filled 2021-02-25: qty 5

## 2021-02-25 MED ORDER — INSULIN ASPART 100 UNIT/ML IJ SOLN
0.0000 [IU] | Freq: Three times a day (TID) | INTRAMUSCULAR | Status: DC
  Administered 2021-02-25: 1 [IU] via SUBCUTANEOUS
  Administered 2021-02-26 – 2021-02-27 (×3): 2 [IU] via SUBCUTANEOUS
  Filled 2021-02-25 (×4): qty 1

## 2021-02-25 MED ORDER — ONDANSETRON HCL 4 MG/2ML IJ SOLN
4.0000 mg | Freq: Three times a day (TID) | INTRAMUSCULAR | Status: DC | PRN
Start: 2021-02-25 — End: 2021-02-27

## 2021-02-25 MED ORDER — PAROXETINE HCL 20 MG PO TABS
20.0000 mg | ORAL_TABLET | Freq: Every day | ORAL | Status: DC
  Administered 2021-02-25 – 2021-02-27 (×3): 20 mg via ORAL
  Filled 2021-02-25 (×3): qty 1

## 2021-02-25 MED ORDER — LORAZEPAM 0.5 MG PO TABS: 0.5000 mg | ORAL_TABLET | Freq: Every day | ORAL | Status: DC | PRN

## 2021-02-25 MED ORDER — INSULIN ASPART 100 UNIT/ML IJ SOLN: 0.0000 [IU] | Freq: Every day | INTRAMUSCULAR | Status: DC

## 2021-02-25 MED ORDER — FENOFIBRATE 160 MG PO TABS
160.0000 mg | ORAL_TABLET | Freq: Every day | ORAL | Status: DC
  Administered 2021-02-25 – 2021-02-27 (×3): 160 mg via ORAL
  Filled 2021-02-25 (×3): qty 1

## 2021-02-25 MED ORDER — SODIUM CHLORIDE 0.9 % IV SOLN
2.0000 g | INTRAVENOUS | Status: DC
  Administered 2021-02-26 – 2021-02-27 (×2): 2 g via INTRAVENOUS
  Filled 2021-02-25 (×3): qty 20

## 2021-02-25 MED ORDER — ORAL CARE MOUTH RINSE
15.0000 mL | Freq: Two times a day (BID) | OROMUCOSAL | Status: DC
  Administered 2021-02-26 – 2021-02-27 (×3): 15 mL via OROMUCOSAL

## 2021-02-25 MED ORDER — APIXABAN 5 MG PO TABS
5.0000 mg | ORAL_TABLET | Freq: Two times a day (BID) | ORAL | Status: DC
  Administered 2021-02-25 – 2021-02-27 (×4): 5 mg via ORAL
  Filled 2021-02-25 (×4): qty 1

## 2021-02-25 MED ORDER — CARVEDILOL 25 MG PO TABS
25.0000 mg | ORAL_TABLET | Freq: Two times a day (BID) | ORAL | Status: DC
  Administered 2021-02-25 – 2021-02-27 (×4): 25 mg via ORAL
  Filled 2021-02-25 (×4): qty 1

## 2021-02-25 MED ORDER — SODIUM CHLORIDE 0.9 % IV SOLN
2.0000 g | Freq: Once | INTRAVENOUS | Status: AC
  Administered 2021-02-25: 2 g via INTRAVENOUS
  Filled 2021-02-25: qty 20

## 2021-02-25 MED ORDER — ACETAMINOPHEN 325 MG PO TABS: 650.0000 mg | ORAL_TABLET | Freq: Four times a day (QID) | ORAL | Status: DC | PRN

## 2021-02-25 MED ORDER — ROPINIROLE HCL 1 MG PO TABS
1.5000 mg | ORAL_TABLET | Freq: Every day | ORAL | Status: DC
  Administered 2021-02-25 – 2021-02-26 (×2): 1.5 mg via ORAL
  Filled 2021-02-25 (×2): qty 2

## 2021-02-25 NOTE — Consult Note (Signed)
PHARMACY -  BRIEF ANTIBIOTIC NOTE   Pharmacy has received consult(s) for vancomycin from an ED provider.  The patient's profile has been reviewed for ht/wt/allergies/indication/available labs.    One time order(s) placed for vancomycin 1g IV  Further antibiotics/pharmacy consults should be ordered by admitting physician if indicated.                       Thank you, Narda Rutherford, PharmD Pharmacy Resident  02/25/2021 11:58 AM

## 2021-02-25 NOTE — Sepsis Progress Note (Signed)
Sepsis protocol is being followed by [facility]. 

## 2021-02-25 NOTE — Consult Note (Signed)
CODE SEPSIS - PHARMACY COMMUNICATION  **Broad Spectrum Antibiotics should be administered within 1 hour of Sepsis diagnosis**  Time Code Sepsis Called/Page Received: 1134  Antibiotics Ordered: ceftriaxone and vancomycin   Time of 1st antibiotic administration: 1233  Additional action taken by pharmacy: spoke to RN and pt only had 1 IV line. ABX admin delayed.   If necessary, Name of Provider/Nurse Contacted: Lorenza Evangelist, PharmD Pharmacy Resident  02/25/2021 11:58 AM

## 2021-02-25 NOTE — Progress Notes (Signed)
Pt stated that he wears CPAP occasionally at home but says he does not need it tonight but will call if he needs equipment.

## 2021-02-25 NOTE — H&P (Signed)
History and Physical    Paul Goodman KLK:[id number] DOB: [birth year] DOA: 02/25/2021  Referring MD/NP/PA:   PCP: [name], MD   Patient coming from:  The patient is coming from home.  At baseline, pt is independent for most of ADL.        Chief Complaint: pain right knee and right foot  HPI: Paul Goodman is a 75 y.o. male with medical history significant of sCHF with EF  <20%, hypertension, hyperlipidemia, diabetes mellitus, COPD, stroke, depression with anxiety, OSA on CPAP, atrial fibrillation on Eliquis, BPH, CKD-2, who presents with pain in right knee and right foot.  Patient state that he injured his right knee 2 weeks ago. He developed worsening swelling, redness and pain in right knee, now has drainage coming out. Patient also reports right foot injury 1 week ago, developed worsening pain, redness and swelling in right foot.  The pain is constant, moderate to severe, nonradiating.  Patient does not have fever or chills.  Denies chest pain, cough, shortness of breath.  No nausea vomiting, diarrhea or abdominal pain.  No symptoms of UTI.  Patient states that he stopped drinking alcohol 2 and half months ago.  ED Course: pt was found to have WBC 12.4, lactic acid 2.3, INR 1.2, PTT 30, negative COVID PCR, CK level 44, slightly worsening renal function, temperature normal, blood pressure 118/69, heart rate 73, RR 18, oxygen saturation 96% on room air.  Chest x-ray negative.  Patient is admitted to [facility] bed as inpatient  X-ray of right knee: 1. Prepatellar soft tissue prominence as could be seen with bursitis or soft tissue skin infection. No evidence of significant subcutaneous emphysema or osseous involvement. 2. Mild tricompartmental degenerative changes of the knee.   X-ray of right foot: 1. Oblique mildly displaced fracture of the first metatarsal shaft. If concern for Lisfranc injury, dedicated weight-bearing views versus cross-sectional imaging could be  obtained.  Review of Systems:   General: no fevers, chills, no body weight gain, has fatigue HEENT: no blurry vision, hearing changes or sore throat Respiratory: no dyspnea, coughing, wheezing CV: no chest pain, no palpitations GI: no nausea, vomiting, abdominal pain, diarrhea, constipation GU: no dysuria, burning on urination, increased urinary frequency, hematuria  Ext: has leg edema. Has pain in right knee and right foot Neuro: no unilateral weakness, numbness, or tingling, no vision change or hearing loss Skin: no rash, no skin tear. MSK: No muscle spasm, no deformity, no limitation of range of movement in spin Heme: No easy bruising.  Travel history: No recent long distant travel.  Allergy: No Known Allergies  Past Medical History:  Diagnosis Date   Anxiety    Arthritis    CHF (congestive heart failure) (HCC)    COPD (chronic obstructive pulmonary disease) (HCC)    Diabetes (HCC)    ED (erectile dysfunction)    Heart murmur    Hyperlipidemia    Hypertension    Melanoma ([place])    Sleep apnea     Past Surgical History:  Procedure Laterality Date   EYE SURGERY Right    cancer   HAND SURGERY Left 1966   KNEE SURGERY Left 1973   TONSILLECTOMY  1964    Social History:  reports that he has quit smoking. His smoking use included cigarettes. He has quit using smokeless tobacco. He reports that he does not currently use alcohol after a past usage of about 14.0 standard drinks per week. He reports current drug use. Drug: Marijuana.  Family History:  Family History  Problem Relation Age of Onset   Kidney Stones Father    Kidney disease Neg Hx    Prostate cancer Neg Hx    Kidney cancer Neg Hx    Bladder Cancer Neg Hx      Prior to Admission medications   Medication Sig Start Date End Date Taking? Authorizing Provider  albuterol (PROVENTIL HFA;VENTOLIN HFA) 108 (90 BASE) MCG/ACT inhaler INHALE 2 PUFFS EVERY 4 - 6 HOURS AS NEEDED 06/07/14   [provider]   amiodarone (PACERONE) 200 MG tablet Take 1 tablet (200 mg total) by mouth 2 (two) times daily. 06/06/20   Fritzi Mandes, MD  apixaban (ELIQUIS) 5 MG TABS tablet Take 1 tablet (5 mg total) by mouth 2 (two) times daily. 06/06/20   Fritzi Mandes, MD  atorvastatin (LIPITOR) 80 MG tablet Take 1 tablet (80 mg total) by mouth daily. 06/07/20   Fritzi Mandes, MD  carvedilol (COREG) 25 MG tablet Take 1 tablet (25 mg total) by mouth 2 (two) times daily with a meal. 06/06/20   Fritzi Mandes, MD  Cinnamon 500 MG capsule Take by mouth.    [provider]  FARXIGA 10 MG TABS tablet Take 10 mg by mouth daily. 05/20/20   [provider]  fenofibrate 160 MG tablet Take 160 mg by mouth daily. 06/16/14   [provider]  furosemide (LASIX) 20 MG tablet Take 20 mg by mouth daily as needed. 12/15/18   [provider]  glipiZIDE (GLUCOTROL XL) 5 MG 24 hr tablet Take 10 mg by mouth daily. 2 tablets daily 09/17/14   [provider]  glucose blood test strip USE AS DIRECTED TWICE DAILY 11/09/15   [provider]  Insulin Pen Needle 32G X 4 MM MISC USE AS DIRECTED 11/09/15   [provider]  L-Lysine 500 MG TABS Take 1 tablet by mouth daily.    [provider]  LORazepam (ATIVAN) 1 MG tablet Take 0.5-1 mg by mouth daily as needed. TAKE ONE-HALF (1/2) TO ONE (1) TABLET BY MOUTH DAILY AS NEEDED FOR ANXIETY, PANIC, OR THROAT SWELLING 05/30/20   [provider]  Magnesium 500 MG TABS Take 1 tablet by mouth daily.    [provider]  metFORMIN (GLUCOPHAGE) 500 MG tablet Take 3 tablets (1,500 mg total) by mouth daily with breakfast. 06/07/20   Fritzi Mandes, MD  niacin 500 MG tablet Take 500 mg by mouth daily.    [provider]  PARoxetine (PAXIL) 20 MG tablet Take 20 mg by mouth daily. 12/07/13   [provider]  rOPINIRole (REQUIP) 0.5 MG tablet Take 3 tablets by mouth at bedtime. 09/10/14   [provider]  sacubitril-valsartan (ENTRESTO)  24-26 MG Take 1 tablet by mouth 2 (two) times daily. 06/06/20   Fritzi Mandes, MD  Saw Palmetto 450 MG CAPS Take 1 capsule by mouth daily.    [provider]  SOLIQUA 100-33 UNT-MCG/ML SOPN Inject 60 Units into the skin in the morning. 11/11/18   [provider]  spironolactone (ALDACTONE) 25 MG tablet Take 0.5 tablets (12.5 mg total) by mouth daily. 06/07/20   Fritzi Mandes, MD  tiotropium (SPIRIVA) 18 MCG inhalation capsule Place 18 mcg into inhaler and inhale daily as needed. 03/23/16   [provider]  vitamin B-12 (CYANOCOBALAMIN) 1000 MCG tablet Take 1,000 mcg by mouth daily.    [provider]    Physical Exam: Vitals:   02/25/21 1230 02/25/21 1300 02/25/21 1430  02/25/21 1632  BP: 100/81 114/64 98/65 133/74  Pulse: 84 94 64 98  Resp:   16 18  Temp:    98 F (36.7 C)  TempSrc:    Oral  SpO2: 97% 100% 99% 100%  Weight:    118 kg  Height:    6' 2" (1.88 m)   General: Not in acute distress HEENT:       Eyes: PERRL, EOMI, no scleral icterus.       ENT: No discharge from the ears and nose, no pharynx injection, no tonsillar enlargement.        Neck: No JVD, no bruit, no mass felt. Heme: No neck lymph node enlargement. Cardiac: S1/S2, RRR, No murmurs, No gallops or rubs. Respiratory: No rales, wheezing, rhonchi or rubs. GI: Soft, nondistended, nontender, no rebound pain, no organomegaly, BS present. GU: No hematuria Ext:  Has erythema, swelling, tenderness, warmth around right knee and the right foot, with little serosanguineous drainage from the front knee joint.     Musculoskeletal: No joint deformities, No joint redness or warmth, no limitation of ROM in spin. Skin: No rashes.  Neuro: Alert, oriented X3, cranial nerves II-XII grossly intact, moves all extremities normally.  Psych: Patient is not psychotic, no suicidal or hemocidal ideation.  Labs on Admission: I have personally reviewed following labs and imaging studies  CBC: Recent Labs   Lab 02/25/21 1032  WBC 12.4*  NEUTROABS 9.4*  HGB 13.2  HCT 39.0  MCV 88.0  PLT 953   Basic Metabolic Panel: Recent Labs  Lab 02/25/21 1032  NA 132*  K 4.8  CL 97*  CO2 27  GLUCOSE 299*  BUN 27*  CREATININE 1.36*  CALCIUM 8.8*   GFR: Estimated Creatinine Clearance: 65 mL/min (A) (by C-G formula based on SCr of 1.36 mg/dL (H)). Liver Function Tests: Recent Labs  Lab 02/25/21 1032  AST 17  ALT 22  ALKPHOS 84  BILITOT 0.5  PROT 7.6  ALBUMIN 3.4*   No results for input(s): LIPASE, AMYLASE in the last 168 hours. No results for input(s): AMMONIA in the last 168 hours. Coagulation Profile: Recent Labs  Lab 02/25/21 1149  INR 1.2   Cardiac Enzymes: Recent Labs  Lab 02/25/21 1149  CKTOTAL 44*   BNP (last 3 results) No results for input(s): PROBNP in the last 8760 hours. HbA1C: No results for input(s): HGBA1C in the last 72 hours. CBG: Recent Labs  Lab 02/25/21 1633  GLUCAP 133*   Lipid Profile: No results for input(s): CHOL, HDL, LDLCALC, TRIG, CHOLHDL, LDLDIRECT in the last 72 hours. Thyroid Function Tests: No results for input(s): TSH, T4TOTAL, FREET4, T3FREE, THYROIDAB in the last 72 hours. Anemia Panel: No results for input(s): VITAMINB12, FOLATE, FERRITIN, TIBC, IRON, RETICCTPCT in the last 72 hours. Urine analysis:    Component Value Date/Time   COLORURINE YELLOW 01/11/2017 0758   APPEARANCEUR CLEAR 01/11/2017 0758   APPEARANCEUR Clear 04/17/2012 2023   LABSPEC >1.030 (H) 01/11/2017 0758   LABSPEC 1.026 04/17/2012 2023   PHURINE 5.5 01/11/2017 0758   GLUCOSEU 500 (A) 01/11/2017 0758   GLUCOSEU Negative 04/17/2012 2023   HGBUR NEGATIVE 01/11/2017 0758   BILIRUBINUR NEGATIVE 01/11/2017 0758   BILIRUBINUR Negative 04/17/2012 2023   KETONESUR NEGATIVE 01/11/2017 0758   PROTEINUR NEGATIVE 01/11/2017 0758   NITRITE NEGATIVE 01/11/2017 0758   LEUKOCYTESUR NEGATIVE 01/11/2017 0758   LEUKOCYTESUR Negative 04/17/2012 2023   Sepsis  Labs: _0 (procalcitonin:4,lacticidven:4) ) Recent Results (from the past 240 hour(s))  Resp Panel by  RT-PCR (Flu A&B, Covid) Nasopharyngeal Swab     Status: None   Collection Time: 02/25/21  2:36 PM   Specimen: Nasopharyngeal Swab; Nasopharyngeal(NP) swabs in vial transport medium  Result Value Ref Range Status   SARS Coronavirus 2 by RT PCR NEGATIVE NEGATIVE Final    Comment: (NOTE) SARS-CoV-2 target nucleic acids are NOT DETECTED.  The SARS-CoV-2 RNA is generally detectable in upper respiratory specimens during the acute phase of infection. The lowest concentration of SARS-CoV-2 viral copies this assay can detect is 138 copies/mL. A negative result does not preclude SARS-Cov-2 infection and should not be used as the sole basis for treatment or other patient management decisions. A negative result may occur with  improper specimen collection/handling, submission of specimen other than nasopharyngeal swab, presence of viral mutation(s) within the areas targeted by this assay, and inadequate number of viral copies(<138 copies/mL). A negative result must be combined with clinical observations, patient history, and epidemiological information. The expected result is Negative.  Fact Sheet for Patients:  EntrepreneurPulse.com.au  Fact Sheet for Healthcare Providers:  IncredibleEmployment.be  This test is no t yet approved or cleared by the [place] FDA and  has been authorized for detection and/or diagnosis of SARS-CoV-2 by FDA under an Emergency Use Authorization (EUA). This EUA will remain  in effect (meaning this test can be used) for the duration of the COVID-19 declaration under Section 564(b)(1) of the Act, 21 U.S.C.section 360bbb-3(b)(1), unless the authorization is terminated  or revoked sooner.       Influenza A by PCR NEGATIVE NEGATIVE Final   Influenza B by PCR NEGATIVE NEGATIVE Final    Comment: (NOTE) The Xpert Xpress  SARS-CoV-2/FLU/RSV plus assay is intended as an aid in the diagnosis of influenza from Nasopharyngeal swab specimens and should not be used as a sole basis for treatment. Nasal washings and aspirates are unacceptable for Xpert Xpress SARS-CoV-2/FLU/RSV testing.  Fact Sheet for Patients: EntrepreneurPulse.com.au  Fact Sheet for Healthcare Providers: IncredibleEmployment.be  This test is not yet approved or cleared by the [place] FDA and has been authorized for detection and/or diagnosis of SARS-CoV-2 by FDA under an Emergency Use Authorization (EUA). This EUA will remain in effect (meaning this test can be used) for the duration of the COVID-19 declaration under Section 564(b)(1) of the Act, 21 U.S.C. section 360bbb-3(b)(1), unless the authorization is terminated or revoked.  Performed at [facility], [address] [place], [place] [address]      Radiological Exams on Admission: DG Knee 2 Views Right  Result Date: 02/25/2021 CLINICAL DATA:  75 year old male with right knee infection. EXAM: RIGHT KNEE - 1-2 VIEW; RIGHT TIBIA AND FIBULA - 2 VIEW COMPARISON:  None. FINDINGS: Right knee: No evidence of fracture, dislocation, or joint effusion. Mild medial compartment joint space narrowing. Mild periarticular osteophyte formation, most prominent about the patella. Prepatellar soft tissue prominence. No evidence of subcutaneous emphysema or radiopaque foreign body. Right tibia-fibula: No evidence of fracture, dislocation, or joint effusion. Achilles calcaneal enthesopathy. Scattered midfoot degenerative changes. Soft tissues are within normal limits. IMPRESSION: 1. Prepatellar soft tissue prominence as could be seen with bursitis or soft tissue skin infection. No evidence of significant subcutaneous emphysema or osseous involvement. 2. Mild tricompartmental degenerative changes of the knee. Electronically Signed   By: Ruthann Cancer M.D.    On: 02/25/2021 12:12   DG Tibia/Fibula Right  Result Date: 02/25/2021 CLINICAL DATA:  75 year old male with right knee infection. EXAM: RIGHT KNEE - 1-2 VIEW; RIGHT TIBIA  AND FIBULA - 2 VIEW COMPARISON:  None. FINDINGS: Right knee: No evidence of fracture, dislocation, or joint effusion. Mild medial compartment joint space narrowing. Mild periarticular osteophyte formation, most prominent about the patella. Prepatellar soft tissue prominence. No evidence of subcutaneous emphysema or radiopaque foreign body. Right tibia-fibula: No evidence of fracture, dislocation, or joint effusion. Achilles calcaneal enthesopathy. Scattered midfoot degenerative changes. Soft tissues are within normal limits. IMPRESSION: 1. Prepatellar soft tissue prominence as could be seen with bursitis or soft tissue skin infection. No evidence of significant subcutaneous emphysema or osseous involvement. 2. Mild tricompartmental degenerative changes of the knee. Electronically Signed   By: Ruthann Cancer M.D.   On: 02/25/2021 12:12   DG Chest Port 1 View  Result Date: 02/25/2021 CLINICAL DATA:  Reason for exam: trauma, pain and swelling, eval for free air Per nurse notes, "C/O infection to right knee. Patient states redness has improved, now noting some leaking from center of knee. -- knee is pink, warm to touch EXAM: PORTABLE CHEST - 1 VIEW COMPARISON:  02/26/2017 FINDINGS: Lungs are clear. Heart size and mediastinal contours are within normal limits. Aortic Atherosclerosis (ICD10-170.0). No effusion.  No pneumothorax. Visualized bones unremarkable. IMPRESSION: No acute cardiopulmonary disease. Electronically Signed   By: Lucrezia Europe M.D.   On: 02/25/2021 12:12   DG Foot Complete Right  Result Date: 02/25/2021 CLINICAL DATA:  injury, pain, swelling EXAM: RIGHT FOOT COMPLETE - 3+ VIEW COMPARISON:  None. FINDINGS: Osteopenia. There is an oblique fracture of the first metatarsal. There is approximately 1/3 shaft width superior and  lateral displacement of the distal fragment. No definitive intra-articular extension. There are several curvilinear radiopaque flecks seen on lateral view which may reflect comminuted osseous fragments. Exuberant soft tissue swelling. No unexpected metallic foreign body. Enthesopathic changes of the Achilles tendon. Vascular calcifications. IMPRESSION: 1. Oblique mildly displaced fracture of the first metatarsal shaft. If concern for Lisfranc injury, dedicated weight-bearing views versus cross-sectional imaging could be obtained. Electronically Signed   By: Valentino Saxon M.D.   On: 02/25/2021 11:00     EKG:   Not done in ED, will get one.   Assessment/Plan Principal Problem:   Septic prepatellar bursitis of right knee Active Problems:   COPD (chronic obstructive pulmonary disease) (HCC)   Hypertension   OSA on CPAP   Cellulitis of right foot   Closed fracture of metatarsal shaft, right, initial encounter   Type II diabetes mellitus with renal manifestations (HCC)   CKD (chronic kidney disease), stage II   Hyperlipidemia   Chronic systolic CHF (congestive heart failure) (HCC)   Atrial fibrillation, chronic (HCC)   Depression with anxiety   Septic prepatellar bursitis of right knee and cellulitis of right foot: Patient does not meet criteria for sepsis.  He has leukocytosis with WBC 12.4, no fever, tachycardia or tachypnea.  He has elevated lactic acid of 2.3, at high risk of developing sepsis now.  Due to EF <20%, will not start IV fluid, but will hold diuretics (Lasix, spironolactone) tonight.  Dr. Mack Guise of Ortho is consulted, patient is poor candidate for surgical treatment.  - Admitted to MedSurg bed as inpatient - Empiric antimicrobial treatment with vancomycin, Rocephin - PRN Zofran for nausea, morphine and Percocet for pain - Blood cultures x 2  - ESR and CRP - wound care consult  Closed fracture of metatarsal shaft, right, initial encounter -Krasinski, patient is poor  candidate for surgery -Pain control  COPD (chronic obstructive pulmonary disease) (HCC) -Bronchodilators  Hypertension -Holding  Entresto and diuretics since patient is at high risk for developing hypotension -IV hydralazine as needed -Continue Coreg  OSA - on CPAP   Type II diabetes mellitus with renal manifestations [facility]): Recent A1c 8.7, poorly controlled.  Patient is taking glipizide, metformin, Farxiga, and soliqua -Sliding scale insulin -change Soliqua 60 unit to glargine insulin 40 units daily  CKD (chronic kidney disease), stage II: Recently baseline creatinine 1.2, his creatinine is 1.36, BUN 27, slightly worsening. -f/u by BMP -Hold diuretics and Entresto  Hyperlipidemia -Fenofibrate, Lipitor  Chronic systolic CHF (congestive heart failure) ([place]): 2D echo on 06/03/2020 showed EF <20%.  Patient does not have shortness of breath.  No pulm edema chest x-ray.  CHF seem to be compensated. -Hold Lasix, spironolactone, Entresto tonight as above -Check BMP  Atrial fibrillation, chronic (HCC) -Coreg, amiodarone -Eliquis  Depression with anxiety -Continue home medications    DVT ppx: on Eliquis Code Status: Full code Family Communication: not done, no family member is at bed side.     Disposition Plan:  Anticipate discharge back to previous environment Consults called: Dr. Mack Guise of Ortho Admission status and Level of care: Med-Surg:   as inpt       Status is: Inpatient  Remains inpatient appropriate because: Patient has multiple comorbidities, including severe CHF with EF<20%, who presents with right knee septic prepatellar bursitis and right foot cellulitis.  Also has right foot first metatarsal shaft fracture.  Patient has elevated lactic acid, and high risk for developing sepsis.  Patient is to be treated in hospital for at least 2 days.        Date of Service 02/25/2021    Ivor Costa [facility]   If 7PM-7AM, please contact  night-coverage [email or web address] 02/25/2021, 6:10 PM

## 2021-02-25 NOTE — Consult Note (Signed)
Pharmacy Antibiotic Note  Paul Goodman is a 75 y.o. male with PMH of anxiety, CHF, COPD, DM, HTN, HLD who admitted on 02/25/2021 with  right knee septic bursitis, right foot infection .  Pharmacy has been consulted for vancomycin dosing.  Afeb, WBC 12.4  Bcx sent, MRSA PCR ordered   Plan: Vancomycin -Pt to receive vancomycin 2500mg  (1000+1500) LD in ED -Will start vancomycin 1250mg  IV q24h   -Est AUC: 440.1   -Css min: 10.5   -Scr used: 1.36   -Wt used: IBW   -Vd: 0.5 -Will obtain vancomycin levels around 4th or 5th dose if continued   Will continue to monitor renal function and adjust dose as clinically indicated   Height: 6\' 2"  (188 cm) Weight: 118.8 kg (261 lb 14.5 oz) IBW/kg (Calculated) : 82.2  Temp (24hrs), Avg:98.2 F (36.8 C), Min:98.2 F (36.8 C), Max:98.2 F (36.8 C)  Recent Labs  Lab 02/25/21 1032  WBC 12.4*  CREATININE 1.36*  LATICACIDVEN 2.3*    Estimated Creatinine Clearance: 65.2 mL/min (A) (by C-G formula based on SCr of 1.36 mg/dL (H)).    No Known Allergies  Antimicrobials this admission: Ongoing Vancomycin  10/22 >>   Complete  Ceftriaxone 10/22 >> 10/22 x1 ED  Microbiology results: 10/22 BCx: sent  Thank you for allowing pharmacy to be a part of this patient's care.  Narda Rutherford, PharmD Pharmacy Resident  02/25/2021 2:05 PM

## 2021-02-25 NOTE — ED Provider Notes (Signed)
[facility] Emergency Department Provider Note  Time seen: 12:33 PM  I have reviewed the triage vital signs and the nursing notes.   HISTORY  Chief Complaint Knee pain, right foot pain  HPI Paul Goodman is a 75 y.o. male with a past medical history of anxiety, CHF, COPD, diabetes, hypertension, hyperlipidemia, presents to the emergency department for right knee as well as right foot pain.  According to the patient approximately 2 weeks ago while kneeling down he accidentally knelt onto a rock which caused a small injury to the right knee.  States over the next week or week and a half he had increased redness and swelling to the right knee and today began draining.  Has a second complaint patient states approximately 1 week ago he dropped a trailer hitch onto his right foot has had increased pain and swelling in the right foot ever since.  No known fever at home.   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   Anxiety 01/14/2017   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/2017   Erectile dysfunction of organic origin 01/14/2017   OSA on CPAP 01/14/2017   Restless leg syndrome 01/14/2017   Type 2 diabetes mellitus with hyperglycemia ([place]) 04/23/2016   Arthritis 02/29/2016   COPD (chronic obstructive pulmonary disease) ([place]) 02/29/2016   Diabetes mellitus ([place]) 02/29/2016   Hyperlipidemia, unspecified 02/29/2016   Hypertension 02/29/2016    Past  Surgical History:  Procedure Laterality Date   EYE SURGERY Right    cancer   HAND SURGERY Left 1966   KNEE SURGERY Left 1973   TONSILLECTOMY  1964    Prior to Admission medications   Medication Sig Start Date End Date Taking? Authorizing Provider  albuterol (PROVENTIL HFA;VENTOLIN HFA) 108 (90 BASE) MCG/ACT inhaler INHALE 2 PUFFS EVERY 4 - 6 HOURS AS NEEDED 06/07/14   [provider]  amiodarone (PACERONE) 200 MG tablet Take 1 tablet (200 mg total) by mouth 2 (two) times daily. 06/06/20   Fritzi Mandes, MD  apixaban (ELIQUIS) 5 MG TABS tablet Take 1 tablet (5 mg total) by mouth 2 (two) times daily. 06/06/20   Fritzi Mandes, MD  atorvastatin (LIPITOR) 80 MG tablet Take 1 tablet (80 mg total) by mouth daily. 06/07/20   Fritzi Mandes, MD  carvedilol (COREG) 25 MG tablet Take 1 tablet (25 mg total) by mouth 2 (two) times daily with a meal. 06/06/20   Fritzi Mandes, MD  Cinnamon 500 MG capsule Take by mouth.    [provider]  FARXIGA 10 MG TABS tablet Take 10 mg by mouth daily. 05/20/20   [provider]  fenofibrate 160 MG tablet Take 160 mg by mouth daily. 06/16/14   [provider]  furosemide (LASIX) 20 MG tablet Take 20 mg by mouth daily as needed. 12/15/18   [provider]  glipiZIDE (GLUCOTROL XL) 5 MG 24 hr tablet Take  10 mg by mouth daily. 2 tablets daily 09/17/14   [provider]  glucose blood test strip USE AS DIRECTED TWICE DAILY 11/09/15   [provider]  Insulin Pen Needle 32G X 4 MM MISC USE AS DIRECTED 11/09/15   [provider]  L-Lysine 500 MG TABS Take 1 tablet by mouth daily.    [provider]  LORazepam (ATIVAN) 1 MG tablet Take 0.5-1 mg by mouth daily as needed. TAKE ONE-HALF (1/2) TO ONE (1) TABLET BY MOUTH DAILY AS NEEDED FOR ANXIETY, PANIC, OR THROAT SWELLING 05/30/20   [provider]  Magnesium 500 MG TABS Take 1 tablet by mouth daily.    [provider]  metFORMIN (GLUCOPHAGE) 500 MG  tablet Take 3 tablets (1,500 mg total) by mouth daily with breakfast. 06/07/20   Fritzi Mandes, MD  niacin 500 MG tablet Take 500 mg by mouth daily.    [provider]  PARoxetine (PAXIL) 20 MG tablet Take 20 mg by mouth daily. 12/07/13   [provider]  rOPINIRole (REQUIP) 0.5 MG tablet Take 3 tablets by mouth at bedtime. 09/10/14   [provider]  sacubitril-valsartan (ENTRESTO) 24-26 MG Take 1 tablet by mouth 2 (two) times daily. 06/06/20   Fritzi Mandes, MD  Saw Palmetto 450 MG CAPS Take 1 capsule by mouth daily.    [provider]  SOLIQUA 100-33 UNT-MCG/ML SOPN Inject 60 Units into the skin in the morning. 11/11/18   [provider]  spironolactone (ALDACTONE) 25 MG tablet Take 0.5 tablets (12.5 mg total) by mouth daily. 06/07/20   Fritzi Mandes, MD  tiotropium (SPIRIVA) 18 MCG inhalation capsule Place 18 mcg into inhaler and inhale daily as needed. 03/23/16   [provider]  vitamin B-12 (CYANOCOBALAMIN) 1000 MCG tablet Take 1,000 mcg by mouth daily.    [provider]    No Known Allergies  Family History  Problem Relation Age of Onset   Kidney Stones Father    Kidney disease Neg Hx    Prostate cancer Neg Hx    Kidney cancer Neg Hx    Bladder Cancer Neg Hx     Social History Social History   Tobacco Use   Smoking status: Former    Packs/day: 0.00    Types: Cigarettes   Smokeless tobacco: Former   Tobacco comments:    quit 1992  Vaping Use   Vaping Use: Never used  Substance Use Topics   Alcohol use: Not Currently    Alcohol/week: 14.0 standard drinks    Types: 14 Standard drinks or equivalent per week   Drug use: Yes    Types: Marijuana    Review of Systems Constitutional: Negative for fever. Cardiovascular: Negative for chest pain. Respiratory: Negative for shortness of breath. Gastrointestinal: Negative for abdominal pain, vomiting  Musculoskeletal: Right knee pain redness swelling and drainage.  Right foot  pain redness and swelling. Skin: Redness and bruising to the right foot, redness of the right knee now with drainage. Neurological: Negative for headache All other ROS negative  ____________________________________________   PHYSICAL EXAM:  VITAL SIGNS: ED Triage Vitals  Enc Vitals Group     BP 02/25/21 1021 118/69     Pulse Rate 02/25/21 1021 73     Resp 02/25/21 1021 18     Temp 02/25/21 1021 98.2 F (36.8 C)     Temp Source 02/25/21 1021 Oral     SpO2 02/25/21 1021 96 %     Weight 02/25/21 1026  261 lb 14.5 oz (118.8 kg)     Height 02/25/21 1026 6\' 2"  (1.88 m)     Head Circumference --      Peak Flow --      Pain Score 02/25/21 1025 4     Pain Loc --      Pain Edu? --      Excl. in [place]? --    Constitutional: Alert and oriented. Well appearing and in no distress. Eyes: Normal exam ENT      Head: Normocephalic and atraumatic.      Mouth/Throat: Mucous membranes are moist. Cardiovascular: Normal rate, regular rhythm.  Respiratory: Normal respiratory effort without tachypnea nor retractions. Breath sounds are clear  Gastrointestinal: Soft and nontender. No distention.  Musculoskeletal: Moderate swelling around the right knee in the prepatellar area with erythema and mild amount of drainage.  Moderate swelling of the right foot with several soft tissue injuries/abrasions as well as ecchymosis, moderately tender to palpation. Neurologic:  Normal speech and language. No gross focal neurologic deficits  Skin:  Skin is warm.  Erythema of the knee and right foot as well as ecchymosis of the right foot.  Mild amount of drainage from the right knee. Psychiatric: Mood and affect are normal. Speech and behavior are normal.   ____________________________________________    RADIOLOGY  Foot x-ray shows oblique fracture through the first metatarsal. Knee x-ray shows likely prepatellar bursitis for soft tissue infection  ____________________________________________   INITIAL  IMPRESSION / ASSESSMENT AND PLAN / ED COURSE  Pertinent labs & imaging results that were available during my care of the patient were reviewed by me and considered in my medical decision making (see chart for details).   Patient presents emergency department for right knee pain swelling and redness as well as right foot pain that occurred at 2 different injuries.  As far as the patient's right knee he appears clinically most like a prepatellar bursitis that is now draining.  Moderate erythema and swelling but good range of motion in the knee.  The right foot is considerably swollen along with soft tissue infections raising concern for cellulitis in addition to x-ray confirmed first metatarsal fracture.  Lab work has resulted showing elevated white blood cell count of 12,400 as well as an elevated lactic acid.  We will send blood cultures, start on IV antibiotics.  Patient initially hesitant to admission to the hospital is agreeable.  States approximately 80% of his care is at the [place] but he would prefer admission to our hospital instead of the [place].  ELYAS VILLAMOR was evaluated in Emergency Department on 02/25/2021 for the symptoms described in the history of present illness. He was evaluated in the context of the global COVID-19 pandemic, which necessitated consideration that the patient might be at risk for infection with the SARS-CoV-2 virus that causes COVID-19. Institutional protocols and algorithms that pertain to the evaluation of patients at risk for COVID-19 are in a state of rapid change based on information released by regulatory bodies including the CDC and federal and state organizations. These policies and algorithms were followed during the patient's care in the ED.  CRITICAL CARE Performed by: Harvest Dark   Total critical care time: 30 minutes  Critical care time was exclusive of separately billable procedures and treating other patients.  Critical care was necessary to treat  or prevent imminent or life-threatening deterioration.  Critical care was time spent personally by me on the following activities: development of treatment plan with patient and/or  surrogate as well as nursing, discussions with consultants, evaluation of patient's response to treatment, examination of patient, obtaining history from patient or surrogate, ordering and performing treatments and interventions, ordering and review of laboratory studies, ordering and review of radiographic studies, pulse oximetry and re-evaluation of patient's condition.  ____________________________________________   FINAL CLINICAL IMPRESSION(S) / ED DIAGNOSES  Sepsis Cellulitis First metatarsal fracture   Harvest Dark, MD 02/25/21 1238

## 2021-02-25 NOTE — ED Triage Notes (Signed)
C/O infection to right knee.  Patient states redness has improved, now noting some leaking from center of knee. -- knee is pink, warm to touch right medial aspect 12:00-2:00.  Also, dropped 300 lb onto right foot (trailer tongue from mobile home) one week ago.  Right foot swollen, reddened and toes ecchymotic.

## 2021-02-26 DIAGNOSIS — L03115 Cellulitis of right lower limb: Secondary | ICD-10-CM | POA: Diagnosis not present

## 2021-02-26 DIAGNOSIS — M71161 Other infective bursitis, right knee: Secondary | ICD-10-CM | POA: Diagnosis not present

## 2021-02-26 DIAGNOSIS — I482 Chronic atrial fibrillation, unspecified: Secondary | ICD-10-CM | POA: Diagnosis not present

## 2021-02-26 LAB — BASIC METABOLIC PANEL
Anion gap: 6 (ref 5–15)
BUN: 17 mg/dL (ref 8–23)
CO2: 26 mmol/L (ref 22–32)
Calcium: 8.1 mg/dL — ABNORMAL LOW (ref 8.9–10.3)
Chloride: 105 mmol/L (ref 98–111)
Creatinine, Ser: 0.69 mg/dL (ref 0.61–1.24)
GFR, Estimated: >60 mL/min (ref >60)
Glucose, Bld: 72 mg/dL (ref 70–99)
Potassium: 3.9 mmol/L (ref 3.5–5.1)
Sodium: 137 mmol/L (ref 135–145)

## 2021-02-26 LAB — CBC
HCT: 33.9 % — ABNORMAL LOW (ref 39.0–52.0)
Hemoglobin: 11.8 g/dL — ABNORMAL LOW (ref 13.0–17.0)
MCH: 31 pg (ref 26.0–34.0)
MCHC: 34.8 g/dL (ref 30.0–36.0)
MCV: 89 fL (ref 80.0–100.0)
Platelets: 288 10*3/uL (ref 150–400)
RBC: 3.81 MIL/uL — ABNORMAL LOW (ref 4.22–5.81)
RDW: 13 % (ref 11.5–15.5)
WBC: 8.6 10*3/uL (ref 4.0–10.5)
nRBC: 0 % (ref 0.0–0.2)

## 2021-02-26 LAB — URINALYSIS, COMPLETE (UACMP) WITH MICROSCOPIC
Bacteria, UA: NONE SEEN
Bilirubin Urine: NEGATIVE
Glucose, UA: >=500 mg/dL — AB
Hgb urine dipstick: NEGATIVE
Ketones, ur: NEGATIVE mg/dL
Leukocytes,Ua: NEGATIVE
Nitrite: NEGATIVE
Protein, ur: NEGATIVE mg/dL
Specific Gravity, Urine: 1.021 (ref 1.005–1.030)
pH: 6 (ref 5.0–8.0)

## 2021-02-26 LAB — URINALYSIS, ROUTINE W REFLEX MICROSCOPIC
Bacteria, UA: NONE SEEN
Bilirubin Urine: NEGATIVE
Glucose, UA: >=500 mg/dL — AB
Hgb urine dipstick: NEGATIVE
Ketones, ur: NEGATIVE mg/dL
Leukocytes,Ua: NEGATIVE
Nitrite: NEGATIVE
Protein, ur: NEGATIVE mg/dL
Specific Gravity, Urine: 1.021 (ref 1.005–1.030)
Squamous Epithelial / HPF: NONE SEEN (ref 0–5)
pH: 6 (ref 5.0–8.0)

## 2021-02-26 LAB — GLUCOSE, CAPILLARY
Glucose-Capillary: 75 mg/dL (ref 70–99)
Glucose-Capillary: 174 mg/dL — ABNORMAL HIGH (ref 70–99)
Glucose-Capillary: 157 mg/dL — ABNORMAL HIGH (ref 70–99)

## 2021-02-26 LAB — MRSA NEXT GEN BY PCR, NASAL: MRSA by PCR Next Gen: NOT DETECTED

## 2021-02-26 MED ORDER — LIDOCAINE HCL 1 % IJ SOLN
10.0000 mL | INTRAMUSCULAR | Status: DC
  Filled 2021-02-26: qty 10

## 2021-02-26 MED ORDER — VANCOMYCIN HCL 1250 MG/250ML IV SOLN
1250.0000 mg | Freq: Two times a day (BID) | INTRAVENOUS | Status: DC
  Administered 2021-02-26 (×2): 1250 mg via INTRAVENOUS
  Filled 2021-02-26 (×3): qty 250

## 2021-02-26 MED ORDER — LIDOCAINE HCL (PF) 1 % IJ SOLN
10.0000 mL | Freq: Once | INTRAMUSCULAR | Status: AC
  Administered 2021-02-26: 10 mL via INTRADERMAL
  Filled 2021-02-26: qty 10

## 2021-02-26 MED ORDER — ROPINIROLE HCL 1 MG PO TABS
1.0000 mg | ORAL_TABLET | Freq: Every day | ORAL | Status: DC | PRN
  Administered 2021-02-26: 1 mg via ORAL
  Filled 2021-02-26: qty 1

## 2021-02-26 NOTE — Progress Notes (Signed)
Subjective:  Patient's right knee reevaluated this afternoon.  Patient had increased drainage from his anterior skin tear just below the patella.  Dr. Vickki Muff has seen the patient for his right first metatarsal fracture earlier.  Objective:   VITALS:   Vitals:   02/25/21 1430 02/25/21 1632 02/25/21 1944 02/26/21 0639  BP: 98/65 133/74 122/79 125/76  Pulse: 64 98 (!) 55 (!) 58  Resp: 16 18 18 20   Temp:  98 F (36.7 C) 98.4 F (36.9 C) 98.2 F (36.8 C)  TempSrc:  Oral Oral   SpO2: 99% 100% 97% 98%  Weight:  118 kg    Height:  6\' 2"  (1.88 m)      PHYSICAL EXAM: Right knee: Serous drainage form skin tear/wound of the anterior knee with surrounding erythema and mild fluctuance. Neurovascular intact Sensation intact distally Intact pulses distally Dorsiflexion/Plantar flexion intact Compartment soft  LABS  Results for orders placed or performed during the hospital encounter of 02/25/21 (from the past 24 hour(s))  Resp Panel by RT-PCR (Flu A&B, Covid) Nasopharyngeal Swab     Status: None   Collection Time: 02/25/21  2:36 PM   Specimen: Nasopharyngeal Swab; Nasopharyngeal(NP) swabs in vial transport medium  Result Value Ref Range   SARS Coronavirus 2 by RT PCR NEGATIVE NEGATIVE   Influenza A by PCR NEGATIVE NEGATIVE   Influenza B by PCR NEGATIVE NEGATIVE  Lactic acid, plasma     Status: None   Collection Time: 02/25/21  3:22 PM  Result Value Ref Range   Lactic Acid, Venous 1.2 0.5 - 1.9 mmol/L  C-reactive protein     Status: Abnormal   Collection Time: 02/25/21  4:24 PM  Result Value Ref Range   CRP 1.7 (H) <1.0 mg/dL  Glucose, capillary     Status: Abnormal   Collection Time: 02/25/21  4:33 PM  Result Value Ref Range   Glucose-Capillary 133 (H) 70 - 99 mg/dL   Comment 1 Notify RN   Urinalysis, Routine w reflex microscopic     Status: Abnormal   Collection Time: 02/25/21  4:45 PM  Result Value Ref Range   Color, Urine STRAW (A) YELLOW   APPearance CLEAR (A) CLEAR    Specific Gravity, Urine 1.021 1.005 - 1.030   pH 6.0 5.0 - 8.0   Glucose, UA >=500 (A) NEGATIVE mg/dL   Hgb urine dipstick NEGATIVE NEGATIVE   Bilirubin Urine NEGATIVE NEGATIVE   Ketones, ur NEGATIVE NEGATIVE mg/dL   Protein, ur NEGATIVE NEGATIVE mg/dL   Nitrite NEGATIVE NEGATIVE   Leukocytes,Ua NEGATIVE NEGATIVE   RBC / HPF 0-5 0 - 5 RBC/hpf   WBC, UA 0-5 0 - 5 WBC/hpf   Bacteria, UA NONE SEEN NONE SEEN   Squamous Epithelial / LPF NONE SEEN 0 - 5  Glucose, capillary     Status: Abnormal   Collection Time: 02/25/21  9:06 PM  Result Value Ref Range   Glucose-Capillary 130 (H) 70 - 99 mg/dL  Basic metabolic panel     Status: Abnormal   Collection Time: 02/26/21  4:28 AM  Result Value Ref Range   Sodium 137 135 - 145 mmol/L   Potassium 3.9 3.5 - 5.1 mmol/L   Chloride 105 98 - 111 mmol/L   CO2 26 22 - 32 mmol/L   Glucose, Bld 72 70 - 99 mg/dL   BUN 17 8 - 23 mg/dL   Creatinine, Ser 0.69 0.61 - 1.24 mg/dL   Calcium 8.1 (L) 8.9 - 10.3 mg/dL   GFR,  Estimated >60 >60 mL/min   Anion gap 6 5 - 15  CBC     Status: Abnormal   Collection Time: 02/26/21  4:28 AM  Result Value Ref Range   WBC 8.6 4.0 - 10.5 K/uL   RBC 3.81 (L) 4.22 - 5.81 MIL/uL   Hemoglobin 11.8 (L) 13.0 - 17.0 g/dL   HCT 33.9 (L) 39.0 - 52.0 %   MCV 89.0 80.0 - 100.0 fL   MCH 31.0 26.0 - 34.0 pg   MCHC 34.8 30.0 - 36.0 g/dL   RDW 13.0 11.5 - 15.5 %   Platelets 288 150 - 400 K/uL   nRBC 0.0 0.0 - 0.2 %  Glucose, capillary     Status: None   Collection Time: 02/26/21  7:35 AM  Result Value Ref Range   Glucose-Capillary 75 70 - 99 mg/dL  MRSA Next Gen by PCR, Nasal     Status: None   Collection Time: 02/26/21  9:30 AM   Specimen: Nasal Mucosa; Nasal Swab  Result Value Ref Range   MRSA by PCR Next Gen NOT DETECTED NOT DETECTED    DG Knee 2 Views Right  Result Date: 02/25/2021 CLINICAL DATA:  75 year old male with right knee infection. EXAM: RIGHT KNEE - 1-2 VIEW; RIGHT TIBIA AND FIBULA - 2 VIEW COMPARISON:   None. FINDINGS: Right knee: No evidence of fracture, dislocation, or joint effusion. Mild medial compartment joint space narrowing. Mild periarticular osteophyte formation, most prominent about the patella. Prepatellar soft tissue prominence. No evidence of subcutaneous emphysema or radiopaque foreign body. Right tibia-fibula: No evidence of fracture, dislocation, or joint effusion. Achilles calcaneal enthesopathy. Scattered midfoot degenerative changes. Soft tissues are within normal limits. IMPRESSION: 1. Prepatellar soft tissue prominence as could be seen with bursitis or soft tissue skin infection. No evidence of significant subcutaneous emphysema or osseous involvement. 2. Mild tricompartmental degenerative changes of the knee. Electronically Signed   By: Ruthann Cancer M.D.   On: 02/25/2021 12:12   DG Tibia/Fibula Right  Result Date: 02/25/2021 CLINICAL DATA:  75 year old male with right knee infection. EXAM: RIGHT KNEE - 1-2 VIEW; RIGHT TIBIA AND FIBULA - 2 VIEW COMPARISON:  None. FINDINGS: Right knee: No evidence of fracture, dislocation, or joint effusion. Mild medial compartment joint space narrowing. Mild periarticular osteophyte formation, most prominent about the patella. Prepatellar soft tissue prominence. No evidence of subcutaneous emphysema or radiopaque foreign body. Right tibia-fibula: No evidence of fracture, dislocation, or joint effusion. Achilles calcaneal enthesopathy. Scattered midfoot degenerative changes. Soft tissues are within normal limits. IMPRESSION: 1. Prepatellar soft tissue prominence as could be seen with bursitis or soft tissue skin infection. No evidence of significant subcutaneous emphysema or osseous involvement. 2. Mild tricompartmental degenerative changes of the knee. Electronically Signed   By: Ruthann Cancer M.D.   On: 02/25/2021 12:12   DG Chest Port 1 View  Result Date: 02/25/2021 CLINICAL DATA:  Reason for exam: trauma, pain and swelling, eval for free air Per  nurse notes, "C/O infection to right knee. Patient states redness has improved, now noting some leaking from center of knee. -- knee is pink, warm to touch EXAM: PORTABLE CHEST - 1 VIEW COMPARISON:  02/26/2017 FINDINGS: Lungs are clear. Heart size and mediastinal contours are within normal limits. Aortic Atherosclerosis (ICD10-170.0). No effusion.  No pneumothorax. Visualized bones unremarkable. IMPRESSION: No acute cardiopulmonary disease. Electronically Signed   By: Lucrezia Europe M.D.   On: 02/25/2021 12:12   DG Foot Complete Right  Result Date: 02/25/2021 CLINICAL DATA:  injury, pain, swelling EXAM: RIGHT FOOT COMPLETE - 3+ VIEW COMPARISON:  None. FINDINGS: Osteopenia. There is an oblique fracture of the first metatarsal. There is approximately 1/3 shaft width superior and lateral displacement of the distal fragment. No definitive intra-articular extension. There are several curvilinear radiopaque flecks seen on lateral view which may reflect comminuted osseous fragments. Exuberant soft tissue swelling. No unexpected metallic foreign body. Enthesopathic changes of the Achilles tendon. Vascular calcifications. IMPRESSION: 1. Oblique mildly displaced fracture of the first metatarsal shaft. If concern for Lisfranc injury, dedicated weight-bearing views versus cross-sectional imaging could be obtained. Electronically Signed   By: Valentino Saxon M.D.   On: 02/25/2021 11:00    Assessment/Plan:     Principal Problem:   Septic prepatellar bursitis of right knee Active Problems:   COPD (chronic obstructive pulmonary disease) (HCC)   Hypertension   OSA on CPAP   Cellulitis of right foot   Closed fracture of metatarsal shaft, right, initial encounter   Type II diabetes mellitus with renal manifestations (HCC)   CKD (chronic kidney disease), stage II   Hyperlipidemia   Chronic systolic CHF (congestive heart failure) (HCC)   Atrial fibrillation, chronic ([place])   Depression with anxiety  Patient is on  IV antibiotics.  The patient had serous drainage from his anterior knee wound which was not present last night.  Seemed to have more fluctuance in this area as well.  Therefore I discussed with the patient about performing a bedside I&D.  The patient was amenable to it.  Procedure note: Patient was prepped with Betadine over the anterior right knee.  He was injected with 10 cc of 1% lidocaine plain.  After few minutes the patient had a vertical incision through the anterior wound which was approximately 1 cm in length.  Using sterile technique suture scissors were used to spread inside the prepatellar bursa.   No purulent fluid was encountered, only blood-tinged serous fluid.  The prepatellar bursa was then copiously irrigated with saline.  An iodoform gauze was used to pack the prepatellar bursa.  Sterile gauze was placed over the wound along with 6 inch Ace wraps x2 to provide compression.  Patient should continue with IV antibiotics as ordered by the hospitalist service.  I will check the wound tomorrow and possibly pull the packing out.  Patient understood and agreed with this plan.   Thornton Park , MD 02/26/2021, 12:47 PM

## 2021-02-26 NOTE — Consult Note (Signed)
ORTHOPAEDIC CONSULTATION  REQUESTING PHYSICIAN: Sharen Hones, MD  Chief Complaint: Right foot fracture  HPI: Paul Goodman is a 75 y.o. male who complains of redness and swelling to his right foot.  Initially admitted secondary to redness and swelling to his right knee.  Had an injury with an abrasion to his right knee that is being treated by orthopedics for possible infection.  About 5 days ago on the 18th of this month a trailer hitch landed on top of his right foot.  Patient did express pain at the time.  He states he has had to be up and ambulatory on this right foot.  He has had some discomfort in the area but not severe.  Does have a longstanding history of diabetes.  He is followed by Dr. Kary Kos in the outpatient clinic.  Chart review reveals last hemoglobin A1c at 7.9 in August.  Patient denies any longstanding numbness into either foot.  Past Medical History:  Diagnosis Date   Anxiety    Arthritis    CHF (congestive heart failure) (HCC)    COPD (chronic obstructive pulmonary disease) (HCC)    Diabetes ([place])    ED (erectile dysfunction)    Heart murmur    Hyperlipidemia    Hypertension    Melanoma ([place])    Sleep apnea    Past Surgical History:  Procedure Laterality Date   EYE SURGERY Right    cancer   HAND SURGERY Left 1966   KNEE SURGERY Left 1973   TONSILLECTOMY  1964   Social History   Socioeconomic History   Marital status: Married    Spouse name: Not on file   Number of children: Not on file   Years of education: Not on file   Highest education level: Not on file  Occupational History   Not on file  Tobacco Use   Smoking status: Former    Packs/day: 0.00    Types: Cigarettes   Smokeless tobacco: Former   Tobacco comments:    quit 1992  Vaping Use   Vaping Use: Never used  Substance and Sexual Activity   Alcohol use: Not Currently    Alcohol/week: 14.0 standard drinks    Types: 14 Standard drinks or equivalent per week   Drug use: Yes     Types: Marijuana   Sexual activity: Not on file  Other Topics Concern   Not on file  Social History Narrative   Not on file   Social Determinants of Health   Financial Resource Strain: Not on file  Food Insecurity: Not on file  Transportation Needs: Not on file  Physical Activity: Not on file  Stress: Not on file  Social Connections: Not on file   Family History  Problem Relation Age of Onset   Kidney Stones Father    Kidney disease Neg Hx    Prostate cancer Neg Hx    Kidney cancer Neg Hx    Bladder Cancer Neg Hx    No Known Allergies Prior to Admission medications   Medication Sig Start Date End Date Taking? Authorizing Provider  amiodarone (PACERONE) 200 MG tablet Take 1 tablet (200 mg total) by mouth 2 (two) times daily. 06/06/20  Yes Fritzi Mandes, MD  apixaban (ELIQUIS) 5 MG TABS tablet Take 1 tablet (5 mg total) by mouth 2 (two) times daily. 06/06/20  Yes Fritzi Mandes, MD  atorvastatin (LIPITOR) 80 MG tablet Take 1 tablet (80 mg total) by mouth daily. 06/07/20  Yes Fritzi Mandes, MD  carvedilol (  COREG) 25 MG tablet Take 1 tablet (25 mg total) by mouth 2 (two) times daily with a meal. 06/06/20  Yes Fritzi Mandes, MD  FARXIGA 10 MG TABS tablet Take 10 mg by mouth daily. 05/20/20  Yes [provider]  fenofibrate 160 MG tablet Take 160 mg by mouth daily. 06/16/14  Yes [provider]  furosemide (LASIX) 20 MG tablet Take 20 mg by mouth daily as needed. 12/15/18  Yes [provider]  glipiZIDE (GLUCOTROL XL) 5 MG 24 hr tablet Take 10 mg by mouth daily. 2 tablets daily 09/17/14  Yes [provider]  rOPINIRole (REQUIP) 0.5 MG tablet Take 3 tablets by mouth at bedtime. 09/10/14  Yes [provider]  sacubitril-valsartan (ENTRESTO) 24-26 MG Take 1 tablet by mouth 2 (two) times daily. 06/06/20  Yes Fritzi Mandes, MD  SOLIQUA 100-33 UNT-MCG/ML SOPN Inject 60 Units into the skin in the morning. 11/11/18  Yes [provider]  tiotropium (SPIRIVA) 18 MCG  inhalation capsule Place 18 mcg into inhaler and inhale daily as needed. 03/23/16  Yes [provider]  albuterol (PROVENTIL HFA;VENTOLIN HFA) 108 (90 BASE) MCG/ACT inhaler INHALE 2 PUFFS EVERY 4 - 6 HOURS AS NEEDED 06/07/14   [provider]  Cinnamon 500 MG capsule Take by mouth. Patient not taking: Reported on 02/25/2021    [provider]  glucose blood test strip USE AS DIRECTED TWICE DAILY 11/09/15   [provider]  Insulin Pen Needle 32G X 4 MM MISC USE AS DIRECTED 11/09/15   [provider]  L-Lysine 500 MG TABS Take 1 tablet by mouth daily. Patient not taking: Reported on 02/25/2021    [provider]  LORazepam (ATIVAN) 1 MG tablet Take 0.5-1 mg by mouth daily as needed. TAKE ONE-HALF (1/2) TO ONE (1) TABLET BY MOUTH DAILY AS NEEDED FOR ANXIETY, PANIC, OR THROAT SWELLING 05/30/20   [provider]  Magnesium 500 MG TABS Take 1 tablet by mouth daily. Patient not taking: Reported on 02/25/2021    [provider]  metFORMIN (GLUCOPHAGE) 500 MG tablet Take 3 tablets (1,500 mg total) by mouth daily with breakfast. Patient not taking: No sig reported 06/07/20   Fritzi Mandes, MD  niacin 500 MG tablet Take 500 mg by mouth daily. Patient not taking: Reported on 02/25/2021    [provider]  PARoxetine (PAXIL) 20 MG tablet Take 20 mg by mouth daily. 12/07/13   [provider]  Saw Palmetto 450 MG CAPS Take 1 capsule by mouth daily. Patient not taking: Reported on 02/25/2021    [provider]  spironolactone (ALDACTONE) 25 MG tablet Take 0.5 tablets (12.5 mg total) by mouth daily. 06/07/20   Fritzi Mandes, MD  vitamin B-12 (CYANOCOBALAMIN) 1000 MCG tablet Take 1,000 mcg by mouth daily. Patient not taking: Reported on 02/25/2021    [provider]   DG Knee 2 Views Right  Result Date: 02/25/2021 CLINICAL DATA:  75 year old male with right knee infection. EXAM: RIGHT KNEE - 1-2 VIEW; RIGHT TIBIA AND  FIBULA - 2 VIEW COMPARISON:  None. FINDINGS: Right knee: No evidence of fracture, dislocation, or joint effusion. Mild medial compartment joint space narrowing. Mild periarticular osteophyte formation, most prominent about the patella. Prepatellar soft tissue prominence. No evidence of subcutaneous emphysema or radiopaque foreign body. Right tibia-fibula: No evidence of fracture, dislocation, or joint effusion. Achilles calcaneal enthesopathy. Scattered midfoot degenerative changes. Soft tissues are within normal limits. IMPRESSION: 1. Prepatellar soft tissue prominence as could be seen  with bursitis or soft tissue skin infection. No evidence of significant subcutaneous emphysema or osseous involvement. 2. Mild tricompartmental degenerative changes of the knee. Electronically Signed   By: Ruthann Cancer M.D.   On: 02/25/2021 12:12   DG Tibia/Fibula Right  Result Date: 02/25/2021 CLINICAL DATA:  75 year old male with right knee infection. EXAM: RIGHT KNEE - 1-2 VIEW; RIGHT TIBIA AND FIBULA - 2 VIEW COMPARISON:  None. FINDINGS: Right knee: No evidence of fracture, dislocation, or joint effusion. Mild medial compartment joint space narrowing. Mild periarticular osteophyte formation, most prominent about the patella. Prepatellar soft tissue prominence. No evidence of subcutaneous emphysema or radiopaque foreign body. Right tibia-fibula: No evidence of fracture, dislocation, or joint effusion. Achilles calcaneal enthesopathy. Scattered midfoot degenerative changes. Soft tissues are within normal limits. IMPRESSION: 1. Prepatellar soft tissue prominence as could be seen with bursitis or soft tissue skin infection. No evidence of significant subcutaneous emphysema or osseous involvement. 2. Mild tricompartmental degenerative changes of the knee. Electronically Signed   By: Ruthann Cancer M.D.   On: 02/25/2021 12:12   DG Chest Port 1 View  Result Date: 02/25/2021 CLINICAL DATA:  Reason for exam: trauma, pain and  swelling, eval for free air Per nurse notes, "C/O infection to right knee. Patient states redness has improved, now noting some leaking from center of knee. -- knee is pink, warm to touch EXAM: PORTABLE CHEST - 1 VIEW COMPARISON:  02/26/2017 FINDINGS: Lungs are clear. Heart size and mediastinal contours are within normal limits. Aortic Atherosclerosis (ICD10-170.0). No effusion.  No pneumothorax. Visualized bones unremarkable. IMPRESSION: No acute cardiopulmonary disease. Electronically Signed   By: Lucrezia Europe M.D.   On: 02/25/2021 12:12   DG Foot Complete Right  Result Date: 02/25/2021 CLINICAL DATA:  injury, pain, swelling EXAM: RIGHT FOOT COMPLETE - 3+ VIEW COMPARISON:  None. FINDINGS: Osteopenia. There is an oblique fracture of the first metatarsal. There is approximately 1/3 shaft width superior and lateral displacement of the distal fragment. No definitive intra-articular extension. There are several curvilinear radiopaque flecks seen on lateral view which may reflect comminuted osseous fragments. Exuberant soft tissue swelling. No unexpected metallic foreign body. Enthesopathic changes of the Achilles tendon. Vascular calcifications. IMPRESSION: 1. Oblique mildly displaced fracture of the first metatarsal shaft. If concern for Lisfranc injury, dedicated weight-bearing views versus cross-sectional imaging could be obtained. Electronically Signed   By: Valentino Saxon M.D.   On: 02/25/2021 11:00    Positive ROS: All other systems have been reviewed and were otherwise negative with the exception of those mentioned in the HPI and as above.  12 point ROS was performed.  Physical Exam: General: Alert and oriented.  No apparent distress.  Vascular:  Left foot:Dorsalis Pedis:  present Posterior Tibial:  present  Right foot: Dorsalis Pedis:  present Posterior Tibial:  present  Neuro:intact gross sensation  Derm: Small abrasion on the dorsal first metatarsal region.  Small dry fracture blister  distal first metatarsal.  Diffuse ecchymosis and erythema to the foot.  See clinical picture   Ortho/MS: Diffuse edema to the right foot.  He is not severely painful with palpation or attempted range of motion today though there is some mild to moderate discomfort.  Assessment: Displaced first metatarsal fracture right foot Diabetes type 2 History of coronary artery disease  Plan: He does have a displaced first metatarsal fracture but has a fair amount of diffuse edema to the right foot.  He also has a soft tissue infection of the right knee this being  treated by orthopedics.  Has history of coronary artery disease with an ejection fraction of less than 20%.  Chronic A. fib on Eliquis.  Ideally given the displacement of the first metatarsal fracture and instability surgical intervention would be performed for stabilization of this.  Given his current situation with the infection in his knee we will delay this for now.  Would also need to receive cardiac clearance for surgery going forward.  His A1c was evaded but below 8 he should be able to tolerate surgery.  He did have a palpable pulse to the area.  I discussed conservative and surgical intervention going forward.  We can this decision after he is more stable and has been discharged.  Now I recommended strict nonweightbearing to his right foot.  We will try to apply some compression to this right foot to diminish some of the edema to the area.  He can follow-up with podiatry in 1 week.    Elesa Hacker, DPM Cell [phone]   02/26/2021 12:25 PM

## 2021-02-26 NOTE — Consult Note (Signed)
[facility] Nurse Consult Note: Reason for Consult:Right knee, right foot wounds. WOC Nursing is consulted simultaneously with Podiatric Medicine and Orthopedics and both Dr. Vickki Muff (Podiatry) and Dr. Mack Guise (Orthopedics) have seen the patient.  There is currently no role for WOC Nursing.   [facility] nursing team will not follow, but will remain available to this patient, the nursing and medical teams.  Please re-consult if needed. Thanks, Paul Flakes, MSN, RN, Paul Goodman, Paul Goodman  Pager# [phone]

## 2021-02-26 NOTE — Consult Note (Signed)
Pharmacy Antibiotic Note  Paul Goodman is a 75 y.o. male with PMH of anxiety, CHF, COPD, DM, HTN, HLD who admitted on 02/25/2021 with  right knee septic bursitis, right foot infection .  Pharmacy has been consulted for vancomycin dosing.  Afeb, WBC 12.4>>8.6  Plan: Vancomycin -Pt to receive vancomycin 2500mg  (1000+1500) LD in ED -Will start vancomycin 1250mg  IV q24h   -Est AUC: 440.1   -Css min: 10.5   -Scr used: 1.36   -Wt used: IBW   -Vd: 0.5 -Will obtain vancomycin levels around 4th or 5th dose if continued   Will continue to monitor renal function and adjust dose as clinically indicated   10/23:  Scr improved 1.36>>0.69 Will adjust Vancomycin to 1250 mg IV Q 12 hrs. Goal AUC 400-550. Expected AUC: 513 Cmin 14.2 SCr used: 0.8  (rounded from 0.69)  Vd in AUC calculations= 0.5  (BMI >30)     Height: 6\' 2"  (188 cm) Weight: 118 kg (260 lb 2.3 oz) IBW/kg (Calculated) : 82.2  Temp (24hrs), Avg:98.2 F (36.8 C), Min:98 F (36.7 C), Max:98.4 F (36.9 C)  Recent Labs  Lab 02/25/21 1032 02/25/21 1522 02/26/21 0428  WBC 12.4*  --  8.6  CREATININE 1.36*  --  0.69  LATICACIDVEN 2.3* 1.2  --      Estimated Creatinine Clearance: 110.6 mL/min (by C-G formula based on SCr of 0.69 mg/dL).    No Known Allergies  Antimicrobials this admission: Ongoing Vancomycin  10/22 >>   Complete  Ceftriaxone 10/22 >> 10/22 x1 ED  Microbiology results: 10/22 BCx: NG<24 hrs MRSA sent?  Thank you for allowing pharmacy to be a part of this patient's care.  Xane Amsden A, PharmD 02/26/2021 9:12 AM

## 2021-02-26 NOTE — Progress Notes (Signed)
PROGRESS NOTE    Paul Goodman  EZM:[id number] DOB: 1946/05/06 DOA: 02/25/2021 PCP: [name], MD   Chief complaint.  Knee pain. Brief Narrative:  Paul Goodman is a 76 y.o. male with medical history significant of sCHF with EF  <20%, hypertension, hyperlipidemia, diabetes mellitus, COPD, stroke, depression with anxiety, OSA on CPAP, atrial fibrillation on Eliquis, BPH, CKD-2, who presents with pain in right knee and right foot. Patient is seen by orthopedics, diagnosed with cellulitis, with possible bursitis.  No joint effusion.  Patient is treated with vancomycin and Rocephin.   Assessment & Plan:   Principal Problem:   Septic prepatellar bursitis of right knee Active Problems:   COPD (chronic obstructive pulmonary disease) (HCC)   Hypertension   OSA on CPAP   Cellulitis of right foot   Closed fracture of metatarsal shaft, right, initial encounter   Type II diabetes mellitus with renal manifestations (HCC)   CKD (chronic kidney disease), stage II   Hyperlipidemia   Chronic systolic CHF (congestive heart failure) (HCC)   Atrial fibrillation, chronic ([place])   Depression with anxiety  Right knee and right foot cellulitis. Prepatellar bursitis. Closed fracture of the metatarsal shaft Patient does not appear to have any septic joint.  Has been evaluated by orthopedics. Will continue antibiotics with vancomycin and Rocephin. Follow-up on culture results. CRP not significant elevated.  Chronic systolic congestive heart failure. Chronic atrial fibrillation. Currently no volume overload.  Continue anticoagulation.  Type 2 diabetes with chronic kidney disease stage II. Continue sliding scale insulin.  Continue long-acting insulin.    DVT prophylaxis: Eliquis Code Status: full Family Communication:  Disposition Plan:    Status is: Inpatient  Remains inpatient appropriate because: Severity of disease and antibiotic use.        I/O last 3 completed  shifts: In: 4763.4 [P.O.:120; I.V.:2050.4; IV Piggyback:2593] Out: 1500 [Urine:1500] Total I/O In: 120 [P.O.:120] Out: -      Consultants:  Orthopedics.  Procedures: None  Antimicrobials: Vancomycin and Rocephin.  Subjective: Patient feels better today, he feels his right knee swelling much better.  Right foot swelling still present. No fever or chills. No dysuria hematuria  No short of breath or cough. No abdominal pain or nausea vomiting.  Objective: Vitals:   02/25/21 1430 02/25/21 1632 02/25/21 1944 02/26/21 0639  BP: 98/65 133/74 122/79 125/76  Pulse: 64 98 (!) 55 (!) 58  Resp: 16 18 18 20   Temp:  98 F (36.7 C) 98.4 F (36.9 C) 98.2 F (36.8 C)  TempSrc:  Oral Oral   SpO2: 99% 100% 97% 98%  Weight:  118 kg    Height:  6\' 2"  (1.88 m)      Intake/Output Summary (Last 24 hours) at 02/26/2021 1119 Last data filed at 02/26/2021 1020 Gross per 24 hour  Intake 4883.37 ml  Output 1500 ml  Net 3383.37 ml   Filed Weights   02/25/21 1026 02/25/21 1632  Weight: 118.8 kg 118 kg    Examination:  General exam: Appears calm and comfortable  Respiratory system: Clear to auscultation. Respiratory effort normal. Cardiovascular system: S1 & S2 heard, RRR. No JVD, murmurs, rubs, gallops or clicks. No pedal edema. Gastrointestinal system: Abdomen is nondistended, soft and nontender. No organomegaly or masses felt. Normal bowel sounds heard. Central nervous system: Alert and oriented. No focal neurological deficits. Extremities: Knee and right foot is still swelling. Skin: No rashes, lesions or ulcers Psychiatry: Judgement and insight appear normal. Mood & affect appropriate.  Data Reviewed: I have personally reviewed following labs and imaging studies  CBC: Recent Labs  Lab 02/25/21 1032 02/26/21 0428  WBC 12.4* 8.6  NEUTROABS 9.4*  --   HGB 13.2 11.8*  HCT 39.0 33.9*  MCV 88.0 89.0  PLT 388 700   Basic Metabolic Panel: Recent Labs  Lab  02/25/21 1032 02/26/21 0428  NA 132* 137  K 4.8 3.9  CL 97* 105  CO2 27 26  GLUCOSE 299* 72  BUN 27* 17  CREATININE 1.36* 0.69  CALCIUM 8.8* 8.1*   GFR: Estimated Creatinine Clearance: 110.6 mL/min (by C-G formula based on SCr of 0.69 mg/dL). Liver Function Tests: Recent Labs  Lab 02/25/21 1032  AST 17  ALT 22  ALKPHOS 84  BILITOT 0.5  PROT 7.6  ALBUMIN 3.4*   No results for input(s): LIPASE, AMYLASE in the last 168 hours. No results for input(s): AMMONIA in the last 168 hours. Coagulation Profile: Recent Labs  Lab 02/25/21 1149  INR 1.2   Cardiac Enzymes: Recent Labs  Lab 02/25/21 1149  CKTOTAL 44*   BNP (last 3 results) No results for input(s): PROBNP in the last 8760 hours. HbA1C: No results for input(s): HGBA1C in the last 72 hours. CBG: Recent Labs  Lab 02/25/21 1633 02/25/21 2106 02/26/21 0735  GLUCAP 133* 130* 75   Lipid Profile: No results for input(s): CHOL, HDL, LDLCALC, TRIG, CHOLHDL, LDLDIRECT in the last 72 hours. Thyroid Function Tests: No results for input(s): TSH, T4TOTAL, FREET4, T3FREE, THYROIDAB in the last 72 hours. Anemia Panel: No results for input(s): VITAMINB12, FOLATE, FERRITIN, TIBC, IRON, RETICCTPCT in the last 72 hours. Sepsis Labs: Recent Labs  Lab 02/25/21 1032 02/25/21 1522  LATICACIDVEN 2.3* 1.2    Recent Results (from the past 240 hour(s))  Blood Culture (routine x 2)     Status: None (Preliminary result)   Collection Time: 02/25/21 11:49 AM   Specimen: BLOOD  Result Value Ref Range Status   Specimen Description BLOOD RIGHT WRIST  Final   Special Requests   Final    BOTTLES DRAWN AEROBIC AND ANAEROBIC Blood Culture adequate volume   Culture   Final    NO GROWTH < 24 HOURS Performed at [facility], [address], [place], [place] [address]    Report Status PENDING  Incomplete  Blood Culture (routine x 2)     Status: None (Preliminary result)   Collection Time: 02/25/21 12:36 PM   Specimen:  BLOOD  Result Value Ref Range Status   Specimen Description BLOOD RIGHT ARM  Final   Special Requests   Final    BOTTLES DRAWN AEROBIC AND ANAEROBIC Blood Culture adequate volume   Culture   Final    NO GROWTH < 24 HOURS Performed at [facility], [address] [place], [place] [address]    Report Status PENDING  Incomplete  Resp Panel by RT-PCR (Flu A&B, Covid) Nasopharyngeal Swab     Status: None   Collection Time: 02/25/21  2:36 PM   Specimen: Nasopharyngeal Swab; Nasopharyngeal(NP) swabs in vial transport medium  Result Value Ref Range Status   SARS Coronavirus 2 by RT PCR NEGATIVE NEGATIVE Final    Comment: (NOTE) SARS-CoV-2 target nucleic acids are NOT DETECTED.  The SARS-CoV-2 RNA is generally detectable in upper respiratory specimens during the acute phase of infection. The lowest concentration of SARS-CoV-2 viral copies this assay can detect is 138 copies/mL. A negative result does not preclude SARS-Cov-2 infection and should not be used as  the sole basis for treatment or other patient management decisions. A negative result may occur with  improper specimen collection/handling, submission of specimen other than nasopharyngeal swab, presence of viral mutation(s) within the areas targeted by this assay, and inadequate number of viral copies(<138 copies/mL). A negative result must be combined with clinical observations, patient history, and epidemiological information. The expected result is Negative.  Fact Sheet for Patients:  EntrepreneurPulse.com.au  Fact Sheet for Healthcare Providers:  IncredibleEmployment.be  This test is no t yet approved or cleared by the [place] FDA and  has been authorized for detection and/or diagnosis of SARS-CoV-2 by FDA under an Emergency Use Authorization (EUA). This EUA will remain  in effect (meaning this test can be used) for the duration of the COVID-19 declaration under Section  564(b)(1) of the Act, 21 U.S.C.section 360bbb-3(b)(1), unless the authorization is terminated  or revoked sooner.       Influenza A by PCR NEGATIVE NEGATIVE Final   Influenza B by PCR NEGATIVE NEGATIVE Final    Comment: (NOTE) The Xpert Xpress SARS-CoV-2/FLU/RSV plus assay is intended as an aid in the diagnosis of influenza from Nasopharyngeal swab specimens and should not be used as a sole basis for treatment. Nasal washings and aspirates are unacceptable for Xpert Xpress SARS-CoV-2/FLU/RSV testing.  Fact Sheet for Patients: EntrepreneurPulse.com.au  Fact Sheet for Healthcare Providers: IncredibleEmployment.be  This test is not yet approved or cleared by the [place] FDA and has been authorized for detection and/or diagnosis of SARS-CoV-2 by FDA under an Emergency Use Authorization (EUA). This EUA will remain in effect (meaning this test can be used) for the duration of the COVID-19 declaration under Section 564(b)(1) of the Act, 21 U.S.C. section 360bbb-3(b)(1), unless the authorization is terminated or revoked.  Performed at [facility], [address] [place], [place] [address]   MRSA Next Gen by PCR, Nasal     Status: None   Collection Time: 02/26/21  9:30 AM   Specimen: Nasal Mucosa; Nasal Swab  Result Value Ref Range Status   MRSA by PCR Next Gen NOT DETECTED NOT DETECTED Final    Comment: (NOTE) The GeneXpert MRSA Assay (FDA approved for NASAL specimens only), is one component of a comprehensive MRSA colonization surveillance program. It is not intended to diagnose MRSA infection nor to guide or monitor treatment for MRSA infections. Test performance is not FDA approved in patients less than 58 years old. Performed at [facility], [address], [place], [place] [address]          Radiology Studies: DG Knee 2 Views Right  Result Date: 02/25/2021 CLINICAL DATA:  75 year old male with right  knee infection. EXAM: RIGHT KNEE - 1-2 VIEW; RIGHT TIBIA AND FIBULA - 2 VIEW COMPARISON:  None. FINDINGS: Right knee: No evidence of fracture, dislocation, or joint effusion. Mild medial compartment joint space narrowing. Mild periarticular osteophyte formation, most prominent about the patella. Prepatellar soft tissue prominence. No evidence of subcutaneous emphysema or radiopaque foreign body. Right tibia-fibula: No evidence of fracture, dislocation, or joint effusion. Achilles calcaneal enthesopathy. Scattered midfoot degenerative changes. Soft tissues are within normal limits. IMPRESSION: 1. Prepatellar soft tissue prominence as could be seen with bursitis or soft tissue skin infection. No evidence of significant subcutaneous emphysema or osseous involvement. 2. Mild tricompartmental degenerative changes of the knee. Electronically Signed   By: Ruthann Cancer M.D.   On: 02/25/2021 12:12   DG Tibia/Fibula Right  Result Date: 02/25/2021 CLINICAL DATA:  75 year old male with  right knee infection. EXAM: RIGHT KNEE - 1-2 VIEW; RIGHT TIBIA AND FIBULA - 2 VIEW COMPARISON:  None. FINDINGS: Right knee: No evidence of fracture, dislocation, or joint effusion. Mild medial compartment joint space narrowing. Mild periarticular osteophyte formation, most prominent about the patella. Prepatellar soft tissue prominence. No evidence of subcutaneous emphysema or radiopaque foreign body. Right tibia-fibula: No evidence of fracture, dislocation, or joint effusion. Achilles calcaneal enthesopathy. Scattered midfoot degenerative changes. Soft tissues are within normal limits. IMPRESSION: 1. Prepatellar soft tissue prominence as could be seen with bursitis or soft tissue skin infection. No evidence of significant subcutaneous emphysema or osseous involvement. 2. Mild tricompartmental degenerative changes of the knee. Electronically Signed   By: Ruthann Cancer M.D.   On: 02/25/2021 12:12   DG Chest Port 1 View  Result Date:  02/25/2021 CLINICAL DATA:  Reason for exam: trauma, pain and swelling, eval for free air Per nurse notes, "C/O infection to right knee. Patient states redness has improved, now noting some leaking from center of knee. -- knee is pink, warm to touch EXAM: PORTABLE CHEST - 1 VIEW COMPARISON:  02/26/2017 FINDINGS: Lungs are clear. Heart size and mediastinal contours are within normal limits. Aortic Atherosclerosis (ICD10-170.0). No effusion.  No pneumothorax. Visualized bones unremarkable. IMPRESSION: No acute cardiopulmonary disease. Electronically Signed   By: Lucrezia Europe M.D.   On: 02/25/2021 12:12   DG Foot Complete Right  Result Date: 02/25/2021 CLINICAL DATA:  injury, pain, swelling EXAM: RIGHT FOOT COMPLETE - 3+ VIEW COMPARISON:  None. FINDINGS: Osteopenia. There is an oblique fracture of the first metatarsal. There is approximately 1/3 shaft width superior and lateral displacement of the distal fragment. No definitive intra-articular extension. There are several curvilinear radiopaque flecks seen on lateral view which may reflect comminuted osseous fragments. Exuberant soft tissue swelling. No unexpected metallic foreign body. Enthesopathic changes of the Achilles tendon. Vascular calcifications. IMPRESSION: 1. Oblique mildly displaced fracture of the first metatarsal shaft. If concern for Lisfranc injury, dedicated weight-bearing views versus cross-sectional imaging could be obtained. Electronically Signed   By: Valentino Saxon M.D.   On: 02/25/2021 11:00        Scheduled Meds:  amiodarone  200 mg Oral BID   apixaban  5 mg Oral BID   atorvastatin  80 mg Oral Daily   carvedilol  25 mg Oral BID WC   chlorhexidine  15 mL Mouth Rinse BID   fenofibrate  160 mg Oral Daily   influenza vaccine adjuvanted  0.5 mL Intramuscular Tomorrow-1000   insulin aspart  0-5 Units Subcutaneous QHS   insulin aspart  0-9 Units Subcutaneous TID WC   insulin glargine  40 Units Subcutaneous Daily   mouth rinse   15 mL Mouth Rinse q12n4p   PARoxetine  20 mg Oral Daily   rOPINIRole  1.5 mg Oral QHS   tiotropium  18 mcg Inhalation Daily   Continuous Infusions:  cefTRIAXone (ROCEPHIN)  IV 2 g (02/26/21 1045)   vancomycin 1,250 mg (02/26/21 1049)     LOS: 1 day    Time spent: 27 minutes    Sharen Hones, MD [facility]   To contact the attending provider between 7A-7P or the covering provider during after hours 7P-7A, please log into the web site [email or web address] and access using universal [place] password for that web site. If you do not have the password, please call the hospital operator.  02/26/2021, 11:19 AM

## 2021-02-26 NOTE — Consult Note (Addendum)
ORTHOPAEDIC CONSULTATION  REQUESTING PHYSICIAN: Sharen Hones, MD  Chief Complaint: Right knee and foot pain and swelling  HPI: Patient seen last night at 7 pm.  Asked by Dr. Blaine Hamper to see [name], a 75 y.o. male diabetic male, with CHF and a 20% ejection fraction with pain and swelling in his right knee and foot.  Patient is on Eliquis.  He indicates he has 14 alcoholic beverages per week.  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  He states a pebble was under his knee and he had to over to the other knee to kneel down.  Within a few days he noticed swelling over the anterior right knee at the inferior pole of the patella and a small superficial abscess that looked like a pimple.  He sterilized some tweezers and removed the white top off the abscess, causing a small skin tear.  He states surrounding skin sloughed off around the knee as well.  Patient has not had fever or chills.    The patient states that his right foot swelling began approximately a week ago who he dropped the tongue of a 300lb trailer onto the dorsum of the foot.  He has been ambulating on the right foot.  Past Medical History:  Diagnosis Date   Anxiety    Arthritis    CHF (congestive heart failure) (HCC)    COPD (chronic obstructive pulmonary disease) (HCC)    Diabetes ([place])    ED (erectile dysfunction)    Heart murmur    Hyperlipidemia    Hypertension    Melanoma ([place])    Sleep apnea    Past Surgical History:  Procedure Laterality Date   EYE SURGERY Right    cancer   HAND SURGERY Left 1966   KNEE SURGERY Left 1973   TONSILLECTOMY  1964   Social History   Socioeconomic History   Marital status: Married    Spouse name: Not on file   Number of children: Not on file   Years of education: Not on file   Highest education level: Not on file  Occupational History   Not on file   Tobacco Use   Smoking status: Former    Packs/day: 0.00    Types: Cigarettes   Smokeless tobacco: Former   Tobacco comments:    quit 1992  Vaping Use   Vaping Use: Never used  Substance and Sexual Activity   Alcohol use: Not Currently    Alcohol/week: 14.0 standard drinks    Types: 14 Standard drinks or equivalent per week   Drug use: Yes    Types: Marijuana   Sexual activity: Not on file  Other Topics Concern   Not on file  Social History Narrative   Not on file   Social Determinants of Health   Financial Resource Strain: Not on file  Food Insecurity: Not on file  Transportation Needs: Not on file  Physical Activity: Not on file  Stress: Not on file  Social Connections: Not on file   Family History  Problem Relation Age of Onset   Kidney Stones Father    Kidney disease Neg Hx    Prostate cancer Neg Hx    Kidney cancer Neg Hx    Bladder Cancer Neg Hx    No Known Allergies Prior to Admission medications   Medication Sig Start Date End  Date Taking? Authorizing Provider  amiodarone (PACERONE) 200 MG tablet Take 1 tablet (200 mg total) by mouth 2 (two) times daily. 06/06/20  Yes Fritzi Mandes, MD  apixaban (ELIQUIS) 5 MG TABS tablet Take 1 tablet (5 mg total) by mouth 2 (two) times daily. 06/06/20  Yes Fritzi Mandes, MD  atorvastatin (LIPITOR) 80 MG tablet Take 1 tablet (80 mg total) by mouth daily. 06/07/20  Yes Fritzi Mandes, MD  carvedilol (COREG) 25 MG tablet Take 1 tablet (25 mg total) by mouth 2 (two) times daily with a meal. 06/06/20  Yes Fritzi Mandes, MD  FARXIGA 10 MG TABS tablet Take 10 mg by mouth daily. 05/20/20  Yes [provider]  fenofibrate 160 MG tablet Take 160 mg by mouth daily. 06/16/14  Yes [provider]  furosemide (LASIX) 20 MG tablet Take 20 mg by mouth daily as needed. 12/15/18  Yes [provider]  glipiZIDE (GLUCOTROL XL) 5 MG 24 hr tablet Take 10 mg by mouth daily. 2 tablets daily 09/17/14  Yes [provider]   rOPINIRole (REQUIP) 0.5 MG tablet Take 3 tablets by mouth at bedtime. 09/10/14  Yes [provider]  sacubitril-valsartan (ENTRESTO) 24-26 MG Take 1 tablet by mouth 2 (two) times daily. 06/06/20  Yes Fritzi Mandes, MD  SOLIQUA 100-33 UNT-MCG/ML SOPN Inject 60 Units into the skin in the morning. 11/11/18  Yes [provider]  tiotropium (SPIRIVA) 18 MCG inhalation capsule Place 18 mcg into inhaler and inhale daily as needed. 03/23/16  Yes [provider]  albuterol (PROVENTIL HFA;VENTOLIN HFA) 108 (90 BASE) MCG/ACT inhaler INHALE 2 PUFFS EVERY 4 - 6 HOURS AS NEEDED 06/07/14   [provider]  Cinnamon 500 MG capsule Take by mouth. Patient not taking: Reported on 02/25/2021    [provider]  glucose blood test strip USE AS DIRECTED TWICE DAILY 11/09/15   [provider]  Insulin Pen Needle 32G X 4 MM MISC USE AS DIRECTED 11/09/15   [provider]  L-Lysine 500 MG TABS Take 1 tablet by mouth daily. Patient not taking: Reported on 02/25/2021    [provider]  LORazepam (ATIVAN) 1 MG tablet Take 0.5-1 mg by mouth daily as needed. TAKE ONE-HALF (1/2) TO ONE (1) TABLET BY MOUTH DAILY AS NEEDED FOR ANXIETY, PANIC, OR THROAT SWELLING 05/30/20   [provider]  Magnesium 500 MG TABS Take 1 tablet by mouth daily. Patient not taking: Reported on 02/25/2021    [provider]  metFORMIN (GLUCOPHAGE) 500 MG tablet Take 3 tablets (1,500 mg total) by mouth daily with breakfast. Patient not taking: No sig reported 06/07/20   Fritzi Mandes, MD  niacin 500 MG tablet Take 500 mg by mouth daily. Patient not taking: Reported on 02/25/2021    [provider]  PARoxetine (PAXIL) 20 MG tablet Take 20 mg by mouth daily. 12/07/13   [provider]  Saw Palmetto 450 MG CAPS Take 1 capsule by mouth daily. Patient not taking: Reported on 02/25/2021    [provider]  spironolactone (ALDACTONE) 25 MG tablet Take 0.5  tablets (12.5 mg total) by mouth daily. 06/07/20   Fritzi Mandes, MD  vitamin B-12 (CYANOCOBALAMIN) 1000 MCG tablet Take 1,000 mcg by mouth daily. Patient not taking: Reported on 02/25/2021    [provider]   DG Knee 2 Views Right  Result Date: 02/25/2021 CLINICAL DATA:  75 year old male with right knee infection. EXAM: RIGHT KNEE - 1-2 VIEW; RIGHT TIBIA AND FIBULA - 2  VIEW COMPARISON:  None. FINDINGS: Right knee: No evidence of fracture, dislocation, or joint effusion. Mild medial compartment joint space narrowing. Mild periarticular osteophyte formation, most prominent about the patella. Prepatellar soft tissue prominence. No evidence of subcutaneous emphysema or radiopaque foreign body. Right tibia-fibula: No evidence of fracture, dislocation, or joint effusion. Achilles calcaneal enthesopathy. Scattered midfoot degenerative changes. Soft tissues are within normal limits. IMPRESSION: 1. Prepatellar soft tissue prominence as could be seen with bursitis or soft tissue skin infection. No evidence of significant subcutaneous emphysema or osseous involvement. 2. Mild tricompartmental degenerative changes of the knee. Electronically Signed   By: Ruthann Cancer M.D.   On: 02/25/2021 12:12   DG Tibia/Fibula Right  Result Date: 02/25/2021 CLINICAL DATA:  75 year old male with right knee infection. EXAM: RIGHT KNEE - 1-2 VIEW; RIGHT TIBIA AND FIBULA - 2 VIEW COMPARISON:  None. FINDINGS: Right knee: No evidence of fracture, dislocation, or joint effusion. Mild medial compartment joint space narrowing. Mild periarticular osteophyte formation, most prominent about the patella. Prepatellar soft tissue prominence. No evidence of subcutaneous emphysema or radiopaque foreign body. Right tibia-fibula: No evidence of fracture, dislocation, or joint effusion. Achilles calcaneal enthesopathy. Scattered midfoot degenerative changes. Soft tissues are within normal limits. IMPRESSION: 1. Prepatellar soft tissue  prominence as could be seen with bursitis or soft tissue skin infection. No evidence of significant subcutaneous emphysema or osseous involvement. 2. Mild tricompartmental degenerative changes of the knee. Electronically Signed   By: Ruthann Cancer M.D.   On: 02/25/2021 12:12   DG Chest Port 1 View  Result Date: 02/25/2021 CLINICAL DATA:  Reason for exam: trauma, pain and swelling, eval for free air Per nurse notes, "C/O infection to right knee. Patient states redness has improved, now noting some leaking from center of knee. -- knee is pink, warm to touch EXAM: PORTABLE CHEST - 1 VIEW COMPARISON:  02/26/2017 FINDINGS: Lungs are clear. Heart size and mediastinal contours are within normal limits. Aortic Atherosclerosis (ICD10-170.0). No effusion.  No pneumothorax. Visualized bones unremarkable. IMPRESSION: No acute cardiopulmonary disease. Electronically Signed   By: Lucrezia Europe M.D.   On: 02/25/2021 12:12   DG Foot Complete Right  Result Date: 02/25/2021 CLINICAL DATA:  injury, pain, swelling EXAM: RIGHT FOOT COMPLETE - 3+ VIEW COMPARISON:  None. FINDINGS: Osteopenia. There is an oblique fracture of the first metatarsal. There is approximately 1/3 shaft width superior and lateral displacement of the distal fragment. No definitive intra-articular extension. There are several curvilinear radiopaque flecks seen on lateral view which may reflect comminuted osseous fragments. Exuberant soft tissue swelling. No unexpected metallic foreign body. Enthesopathic changes of the Achilles tendon. Vascular calcifications. IMPRESSION: 1. Oblique mildly displaced fracture of the first metatarsal shaft. If concern for Lisfranc injury, dedicated weight-bearing views versus cross-sectional imaging could be obtained. Electronically Signed   By: Valentino Saxon M.D.   On: 02/25/2021 11:00    Positive ROS: All other systems have been reviewed and were otherwise negative with the exception of those mentioned in the HPI and  as above.  Physical Exam: General: Alert, no acute distress.  Patient lying in bed.   MUSCULOSKELETAL:  Right knee:  Patient has a 4 cm diameter of erythema around a eschar near the inferior pole of his patella.  This is mildly tender, but not fluctuant.  No fluid can be expressed around the eschar.  Patient can flex and extend his knee actively without pain or limitation of motion.  There is minimal swelling.  Right foot:  Patient has  significant swelling and ecchymosis around the right foot.  His foot compartments are not tense, despite the swelling.  The is a small 1 cm wound over the dorsal forefoot which appears to be healing by secondary intention and has approximately a 2 cm diameter area of erythema surrounding it.  There is no drainage from this area.  Patient has minimal tenderness around this area to palpation.  He had mild tenderness over the 1st metatarsal and minimal pain to forefoot and midfoot squeeze testing.  He can flex and extend all his toes without pain.  His toes are well perfused and he states he has intact sensation to light touch throughout the right foot.  There is no obvious deformity of the foot despite significant swelling.  RADIOLOGY:  I reviewed the patients right foot xrays.  These show an oblique, displaced fracture of the distal first metatarsal without significant angulation.  Assessment: Right anterior knee cellulitis with possible right septic prepatellar bursitis Displaced spiral fracture of the right first metatarsal  Cellulitis of the dorsal right forefoot   Plan: I explained to the patient that he has cellulitis and possible septic bursitis in his right knee.   I do not see a significant amount of bursal swelling or feel an abscess that can be drained.   I would recommend continued IV antibiotics and close monitoring for his knee.   Patient states the area of redness is dramatically smaller than it has been.  I have contacted Dr. Samara Deist from  podiatry and asked him to see the patient for management of his first metatarsal fracture and to evaluate the dorsal foot wound for cellulitis vs. Abscess.   There is no drainage or fluctuance in the dorsal foot that I can see and IV antibiotics may be all that is required for management of his cellulitis.  Dr. Vickki Muff has agreed to see the patient for his right foot.  I will continue to follow the patient to monitor his right knee's response to IV antibiotics.  Patient does not need surgical intervention for his right knee at this time as he seems to be responding to the IV antibiotics. I feel the primary issue with the knee is more cellulitis due to the skin tear and currently there is no evidence for a drainable abscess.  The patient does not even have enough bursal fluid to aspirate at this time.  Patient explains that he needs to leave the hospital soon for work this week, but I explained to him that he could lose his foot or leg if he does not get appropriate treatment for the cellulitis of his foot and knee as the infection could spread if not treated long enough with IV antibiotics.   Thornton Park, MD    02/26/2021 6:43 AM

## 2021-02-27 DIAGNOSIS — I5022 Chronic systolic (congestive) heart failure: Secondary | ICD-10-CM | POA: Diagnosis not present

## 2021-02-27 DIAGNOSIS — M71161 Other infective bursitis, right knee: Secondary | ICD-10-CM | POA: Diagnosis not present

## 2021-02-27 DIAGNOSIS — I482 Chronic atrial fibrillation, unspecified: Secondary | ICD-10-CM | POA: Diagnosis not present

## 2021-02-27 LAB — CREATININE, SERUM
Creatinine, Ser: 0.94 mg/dL (ref 0.61–1.24)
GFR, Estimated: >60 mL/min (ref >60)

## 2021-02-27 LAB — GLUCOSE, CAPILLARY
Glucose-Capillary: 159 mg/dL — ABNORMAL HIGH (ref 70–99)
Glucose-Capillary: 184 mg/dL — ABNORMAL HIGH (ref 70–99)

## 2021-02-27 MED ORDER — AMOXICILLIN-POT CLAVULANATE 875-125 MG PO TABS: 1.0000 | ORAL_TABLET | Freq: Two times a day (BID) | ORAL | 0 refills | Status: AC

## 2021-02-27 MED ORDER — VANCOMYCIN HCL IN DEXTROSE 1-5 GM/200ML-% IV SOLN
1000.0000 mg | Freq: Two times a day (BID) | INTRAVENOUS | Status: DC
  Administered 2021-02-27: 1000 mg via INTRAVENOUS
  Filled 2021-02-27: qty 200

## 2021-02-27 MED ORDER — SULFAMETHOXAZOLE-TRIMETHOPRIM 800-160 MG PO TABS: 1.0000 | ORAL_TABLET | Freq: Two times a day (BID) | ORAL | 0 refills | Status: AC

## 2021-02-27 MED ORDER — INFLUENZA VAC A&B SA ADJ QUAD 0.5 ML IM PRSY
0.5000 mL | PREFILLED_SYRINGE | Freq: Once | INTRAMUSCULAR | Status: AC
  Administered 2021-02-27: 0.5 mL via INTRAMUSCULAR

## 2021-02-27 NOTE — Progress Notes (Signed)
  Subjective:  Patient is seen in his hospital room with his wife at the bedside.  Patient reports his right knee pain as mild and feels improved compared to yesterday.  Objective:   VITALS:   Vitals:   02/26/21 2121 02/27/21 0355 02/27/21 0753 02/27/21 1124  BP: 133/77 117/75 122/69 109/77  Pulse: 84 72 64 67  Resp: 17 16 16 18   Temp: 99.2 F (37.3 C) 98.5 F (36.9 C) 98.2 F (36.8 C) 98.2 F (36.8 C)  TempSrc: Oral Oral Oral Oral  SpO2: 99% 97% 98% 99%  Weight:      Height:        PHYSICAL EXAM: Right knee: Patient's packing was removed today.  There is no active drainage.  Erythema is still present but appears slightly improved.  Patient still can move his knee actively without pain.   LABS  Results for orders placed or performed during the hospital encounter of 02/25/21 (from the past 24 hour(s))  Glucose, capillary     Status: Abnormal   Collection Time: 02/26/21  4:24 PM  Result Value Ref Range   Glucose-Capillary 174 (H) 70 - 99 mg/dL  Glucose, capillary     Status: Abnormal   Collection Time: 02/26/21  8:53 PM  Result Value Ref Range   Glucose-Capillary 157 (H) 70 - 99 mg/dL   Comment 1 Notify RN   Creatinine, serum     Status: None   Collection Time: 02/27/21  4:00 AM  Result Value Ref Range   Creatinine, Ser 0.94 0.61 - 1.24 mg/dL   GFR, Estimated >60 >60 mL/min  Glucose, capillary     Status: Abnormal   Collection Time: 02/27/21  8:28 AM  Result Value Ref Range   Glucose-Capillary 159 (H) 70 - 99 mg/dL  Glucose, capillary     Status: Abnormal   Collection Time: 02/27/21 12:02 PM  Result Value Ref Range   Glucose-Capillary 184 (H) 70 - 99 mg/dL    No results found.  Assessment/Plan:     Principal Problem:   Septic prepatellar bursitis of right knee Active Problems:   COPD (chronic obstructive pulmonary disease) (HCC)   Hypertension   OSA on CPAP   Cellulitis of right foot   Closed fracture of metatarsal shaft, right, initial encounter    Type II diabetes mellitus with renal manifestations (HCC)   CKD (chronic kidney disease), stage II   Hyperlipidemia   Chronic systolic CHF (congestive heart failure) (HCC)   Atrial fibrillation, chronic ([place])   Depression with anxiety  Patient has improved during his hospitalization.  Patient will be discharged home today.  I am recommending 10 days of oral antibiotics.  Augmentin and Bactrim are a good combination for broad-spectrum coverage.  Patient will follow-up with me in the office in 7 days for his right knee.  Patient was seen by Dr. Samara Deist from podiatry regarding his right first metatarsal fracture which has caused significant swelling and ecchymosis in his right foot.  Patient was recommended to remain nonweightbearing on his right foot with a boot.  He will follow-up to see Dr. Vickki Muff in 1 week for management of his first metatarsal fracture.    Thornton Park , MD 02/27/2021, 1:30 PM

## 2021-02-27 NOTE — Evaluation (Signed)
Physical Therapy Evaluation Patient Details Name: Paul Goodman MRN: [id number] DOB: 05/24/1945 Today's Date: 02/27/2021  History of Present Illness  Pt is a 75 y.o. male with medical history significant of sCHF with EF  <20%, hypertension, hyperlipidemia, diabetes mellitus, COPD, stroke, depression with anxiety, OSA on CPAP, atrial fibrillation on Eliquis, BPH, CKD-2, who presents with pain in right knee and right foot. MD assessment includes: Right knee and right foot cellulitis, prepatellar bursitis, and displaced first metatarsal fracture of the right foot.   Clinical Impression  Pt was pleasant and motivated to participate during the session and put forth great effort throughout.  Pt required no physical assistance during the session but did require cuing for proper sequencing to maintain RLE NWB status during transfers, gait, and stair training.  Overall pt performed all functional tasks well with good WB compliance and without adverse symptoms.  Pt's SpO2 and HR were both WNL on room air.  Pt will benefit from HHPT upon discharge to safely address deficits listed in patient problem list for decreased caregiver assistance and eventual return to PLOF.         Recommendations for follow up therapy are one component of a multi-disciplinary discharge planning process, led by the attending physician.  Recommendations may be updated based on patient status, additional functional criteria and insurance authorization.  Follow Up Recommendations Home health PT    Assistance Recommended at Discharge Intermittent Supervision/Assistance  Functional Status Assessment Patient has had a recent decline in their functional status and demonstrates the ability to make significant improvements in function in a reasonable and predictable amount of time.  Equipment Recommendations  None recommended by PT    Recommendations for Other Services       Precautions / Restrictions Precautions Precautions:  Fall Restrictions Weight Bearing Restrictions: Yes RLE Weight Bearing: Non weight bearing      Mobility  Bed Mobility Overal bed mobility: Independent                  Transfers Overall transfer level: Needs assistance Equipment used: Rolling walker (2 wheels) Transfers: Sit to/from Stand Sit to Stand: Min guard           General transfer comment: Min-mod verbal and visual cues for sequencing for WB compliance; good eccentric/concentric control and stability with good WB compliance    Ambulation/Gait Ambulation/Gait assistance: Min guard Gait Distance (Feet): 3 Feet Assistive device: Rolling walker (2 wheels)   Gait velocity: decreased   General Gait Details: Hop-to gait pattern with multi-modal cues for proper sequencing for WB compliance; good control and stability throughout  Stairs Stairs: Yes Stairs assistance: Min guard Stair Management: With walker;Backwards;Forwards Number of Stairs: 4 General stair comments: Training provided for ascending backwards with a RW and descending forwards with multi-modal cues for sequencing; pt able to demonstrate good control and stability  Wheelchair Mobility    Modified Rankin (Stroke Patients Only)       Balance Overall balance assessment: Needs assistance   Sitting balance-Leahy Scale: Normal     Standing balance support: Bilateral upper extremity supported;During functional activity Standing balance-Leahy Scale: Good                               Pertinent Vitals/Pain Pain Assessment: 0-10 Pain Score: 1  Pain Location: RLE Pain Descriptors / Indicators: Sore Pain Intervention(s): Monitored during session;Repositioned    Home Living Family/patient expects to be discharged to:: Private residence Living  Arrangements: Spouse/significant other Available Help at Discharge: Family;Available 24 hours/day Type of Home: House Home Access: Stairs to enter Entrance Stairs-Rails: Right;Left;Can  reach both Entrance Stairs-Number of Steps: 5   Home Layout: One level Home Equipment: Grab bars - tub/shower;Shower seat - built Medical sales representative (2 wheels);BSC      Prior Function Prior Level of Function : Independent/Modified Independent             Mobility Comments: Ind amb without an AD, community ambulator, 4-5 falls in the last 6 months secondary to tripping while on job sites, Ind with ADLs       Hand Dominance   Dominant Hand: Right    Extremity/Trunk Assessment   Upper Extremity Assessment Upper Extremity Assessment: Overall WFL for tasks assessed    Lower Extremity Assessment Lower Extremity Assessment: Overall WFL for tasks assessed       Communication   Communication: No difficulties  Cognition Arousal/Alertness: Awake/alert Behavior During Therapy: WFL for tasks assessed/performed Overall Cognitive Status: Within Functional Limits for tasks assessed                                          General Comments      Exercises Other Exercises Other Exercises: Car transfer sequencing education   Assessment/Plan    PT Assessment Patient needs continued PT services  PT Problem List Decreased activity tolerance;Decreased balance;Decreased knowledge of use of DME;Decreased knowledge of precautions;Pain       PT Treatment Interventions DME instruction;Gait training;Stair training;Functional mobility training;Therapeutic activities;Therapeutic exercise;Balance training;Patient/family education    PT Goals (Current goals can be found in the Care Plan section)  Acute Rehab PT Goals Patient Stated Goal: Get back home and back to work PT Goal Formulation: With patient Time For Goal Achievement: 03/12/21 Potential to Achieve Goals: Good    Frequency 7X/week   Barriers to discharge        Co-evaluation               AM-PAC PT "6 Clicks" Mobility  Outcome Measure Help needed turning from your back to your side while in a  flat bed without using bedrails?: None Help needed moving from lying on your back to sitting on the side of a flat bed without using bedrails?: None Help needed moving to and from a bed to a chair (including a wheelchair)?: A Little Help needed standing up from a chair using your arms (e.g., wheelchair or bedside chair)?: A Little Help needed to walk in hospital room?: A Little Help needed climbing 3-5 steps with a railing? : A Little 6 Click Score: 20    End of Session Equipment Utilized During Treatment: Gait belt Activity Tolerance: Patient tolerated treatment well Patient left: in chair;with call bell/phone within reach;with chair alarm set Nurse Communication: Mobility status;Weight bearing status PT Visit Diagnosis: Difficulty in walking, not elsewhere classified (R26.2);Pain Pain - Right/Left: Right Pain - part of body: Ankle and joints of foot    Time: 2956-2130 PT Time Calculation (min) (ACUTE ONLY): 52 min   Charges:   PT Evaluation $PT Eval Moderate Complexity: 1 Mod PT Treatments $Gait Training: 23-37 mins       D. Scott Nysia Dell PT, DPT 02/27/21, 1:50 PM

## 2021-02-27 NOTE — TOC Transition Note (Signed)
Transition of Care [facility]) - CM/SW Discharge Note   Patient Details  Name: Paul Goodman MRN: [id number] Date of Birth: 14-Aug-1945  [facility] CM/SW Contact:  Pete Pelt, RN Phone Number: 02/27/2021, 3:33 PM   Clinical Narrative:   Patient lives at home with wife, states he already "has therapy set up through my doctor" and that he "already has all the equipment needed."  Patient states wife can assist and drive him to appointments, states he has no concerns about getting to pharmacy and taking medications as directed.  Patient has no reservations about returning home at discharge.    Final next level of care: Home/Self Care (Patient refuses, states he already has home health in place) Barriers to Discharge: Barriers Resolved   Patient Goals and CMS Choice     Choice offered to / list presented to : NA  Discharge Placement                       Discharge Plan and Services   Discharge Planning Services: CM Consult Post Acute Care Choice: NA            DME Agency:  (Patient refuses, states he has equipment)       HH Arranged: Patient Refused [facility]          Social Determinants of Health (SDOH) Interventions     Readmission Risk Interventions No flowsheet data found.

## 2021-02-27 NOTE — Discharge Summary (Signed)
Physician Discharge Summary  Patient ID: Paul Goodman MRN: [id number] DOB/AGE: 1945-09-01 75 y.o.  Admit date: 02/25/2021 Discharge date: 02/27/2021  Admission Diagnoses:  Discharge Diagnoses:  Principal Problem:   Septic prepatellar bursitis of right knee Active Problems:   COPD (chronic obstructive pulmonary disease) ([place])   Hypertension   OSA on CPAP   Cellulitis of right foot   Closed fracture of metatarsal shaft, right, initial encounter   Type II diabetes mellitus with renal manifestations (HCC)   CKD (chronic kidney disease), stage II   Hyperlipidemia   Chronic systolic CHF (congestive heart failure) (HCC)   Atrial fibrillation, chronic ([place])   Depression with anxiety   Discharged Condition: good  Hospital Course:  Paul Goodman is a 75 y.o. male with medical history significant of sCHF with EF  <20%, hypertension, hyperlipidemia, diabetes mellitus, COPD, stroke, depression with anxiety, OSA on CPAP, atrial fibrillation on Eliquis, BPH, CKD-2, who presents with pain in right knee and right foot. Patient is seen by orthopedics, diagnosed with cellulitis, with possible bursitis.  No joint effusion.  Patient is treated with vancomycin and Rocephin.  Right knee and right foot cellulitis. Prepatellar bursitis. Closed fracture of the metatarsal shaft Patient does not appear to have any septic joint.  Has been evaluated by orthopedics.  Patient had surgical irrigation of the prepatellar bursa by orthopedics.  Discussed with Dr. Mack Guise, patient can be discharged from his standpoint on Augmentin and Bactrim.  Followed by him in the near future.  Patient also seen by podiatry for foot wound, will need a future surgery.  Appointment with Dr. Vickki Muff in 1 week.  Chronic systolic congestive heart failure. Chronic atrial fibrillation. Currently no volume overload.  Continue anticoagulation. Resume or heart failure treatment.  Type 2 diabetes with chronic kidney disease  stage II. Home medicine.      Consults: orthopedic surgery, podiatry  Significant Diagnostic Studies:   Treatments: vancomycin, rocephin  Discharge Exam: Blood pressure 109/77, pulse 67, temperature 98.2 F (36.8 C), temperature source Oral, resp. rate 18, height 6\' 2"  (1.88 m), weight 118 kg, SpO2 99 %. General appearance: alert and cooperative Resp: clear to auscultation bilaterally Cardio: regular rate and rhythm, S1, S2 normal, no murmur, click, rub or gallop GI: soft, non-tender; bowel sounds normal; no masses,  no organomegaly Extremities: Right knee swelling much better, right foot still swelling but not tender.  Disposition: Discharge disposition: 01-Home or Self Care       Discharge Instructions     Diet - low sodium heart healthy   Complete by: As directed    Discharge wound care:   Complete by: As directed    Follow with podiatry   Increase activity slowly   Complete by: As directed       Allergies as of 02/27/2021   No Known Allergies      Medication List     STOP taking these medications    Cinnamon 500 MG capsule   L-Lysine 500 MG Tabs   Magnesium 500 MG Tabs   metFORMIN 500 MG tablet Commonly known as: GLUCOPHAGE   niacin 500 MG tablet   Saw Palmetto 450 MG Caps   vitamin B-12 1000 MCG tablet Commonly known as: CYANOCOBALAMIN       TAKE these medications    albuterol 108 (90 Base) MCG/ACT inhaler Commonly known as: VENTOLIN HFA INHALE 2 PUFFS EVERY 4 - 6 HOURS AS NEEDED   amiodarone 200 MG tablet Commonly known as: PACERONE Take 1 tablet (200  mg total) by mouth 2 (two) times daily.   amoxicillin-clavulanate 875-125 MG tablet Commonly known as: Augmentin Take 1 tablet by mouth 2 (two) times daily for 10 days.   apixaban 5 MG Tabs tablet Commonly known as: ELIQUIS Take 1 tablet (5 mg total) by mouth 2 (two) times daily.   atorvastatin 80 MG tablet Commonly known as: LIPITOR Take 1 tablet (80 mg total) by mouth  daily.   carvedilol 25 MG tablet Commonly known as: COREG Take 1 tablet (25 mg total) by mouth 2 (two) times daily with a meal.   Farxiga 10 MG Tabs tablet Generic drug: dapagliflozin propanediol Take 10 mg by mouth daily.   fenofibrate 160 MG tablet Take 160 mg by mouth daily.   furosemide 20 MG tablet Commonly known as: LASIX Take 20 mg by mouth daily as needed.   glipiZIDE 5 MG 24 hr tablet Commonly known as: GLUCOTROL XL Take 10 mg by mouth daily. 2 tablets daily   glucose blood test strip USE AS DIRECTED TWICE DAILY   Insulin Pen Needle 32G X 4 MM Misc USE AS DIRECTED   LORazepam 1 MG tablet Commonly known as: ATIVAN Take 0.5-1 mg by mouth daily as needed. TAKE ONE-HALF (1/2) TO ONE (1) TABLET BY MOUTH DAILY AS NEEDED FOR ANXIETY, PANIC, OR THROAT SWELLING   PARoxetine 20 MG tablet Commonly known as: PAXIL Take 20 mg by mouth daily.   rOPINIRole 0.5 MG tablet Commonly known as: REQUIP Take 3 tablets by mouth at bedtime.   sacubitril-valsartan 24-26 MG Commonly known as: ENTRESTO Take 1 tablet by mouth 2 (two) times daily.   Soliqua 100-33 UNT-MCG/ML Sopn Generic drug: Insulin Glargine-Lixisenatide Inject 60 Units into the skin in the morning.   spironolactone 25 MG tablet Commonly known as: ALDACTONE Take 0.5 tablets (12.5 mg total) by mouth daily.   sulfamethoxazole-trimethoprim 800-160 MG tablet Commonly known as: BACTRIM DS Take 1 tablet by mouth 2 (two) times daily for 8 days.   tiotropium 18 MCG inhalation capsule Commonly known as: SPIRIVA Place 18 mcg into inhaler and inhale daily as needed.               Discharge Care Instructions  (From admission, onward)           Start     Ordered   02/27/21 0000  Discharge wound care:       Comments: Follow with podiatry   02/27/21 1306            Follow-up Information     [name], MD Follow up in 1 week(s).   Specialty: Family Medicine Contact information: [address] [place] [address] [phone]         Thornton Park, MD Follow up in 1 week(s).   Specialty: Orthopedic Surgery Contact information: [place] [address] [phone]         Samara Deist, DPM Follow up in 1 week(s).   Specialty: Podiatry Contact information: [place] [address] [phone]                32 minutes Signed: Sharen Hones 02/27/2021, 1:06 PM

## 2021-02-27 NOTE — Plan of Care (Signed)
Patient discharged home per MD orders at this time.All discharge instructions,education and medications reviewed with patient at the bedside.Pt expressed understanding and will comply with dc instructions.follow up appointments was also communicated to Pt.no verbal c/o or any ssx of distress.patient to follow up with podiatry per recommendation. Patient was transported home by wife in a private car.

## 2021-03-02 LAB — CULTURE, BLOOD (ROUTINE X 2)
Culture: NO GROWTH
Culture: NO GROWTH
Special Requests: ADEQUATE
Special Requests: ADEQUATE

## 2022-03-15 ENCOUNTER — Emergency Department: Payer: Federal, State, Local not specified - PPO

## 2022-03-15 ENCOUNTER — Other Ambulatory Visit: Payer: Federal, State, Local not specified - PPO

## 2022-03-15 ENCOUNTER — Emergency Department
Admission: EM | Admit: 2022-03-15 | Discharge: 2022-03-15 | Disposition: A | Discharge status: Home / Self Care | Payer: Federal, State, Local not specified - PPO | Attending: Emergency Medicine | Admitting: Emergency Medicine

## 2022-03-15 ENCOUNTER — Other Ambulatory Visit: Payer: Self-pay

## 2022-03-15 DIAGNOSIS — J449 Chronic obstructive pulmonary disease, unspecified: Secondary | ICD-10-CM | POA: Diagnosis not present

## 2022-03-15 DIAGNOSIS — M79601 Pain in right arm: Secondary | ICD-10-CM | POA: Insufficient documentation

## 2022-03-15 DIAGNOSIS — M79602 Pain in left arm: Secondary | ICD-10-CM | POA: Insufficient documentation

## 2022-03-15 DIAGNOSIS — R61 Generalized hyperhidrosis: Secondary | ICD-10-CM | POA: Insufficient documentation

## 2022-03-15 DIAGNOSIS — E119 Type 2 diabetes mellitus without complications: Secondary | ICD-10-CM | POA: Diagnosis not present

## 2022-03-15 DIAGNOSIS — R079 Chest pain, unspecified: Secondary | ICD-10-CM | POA: Insufficient documentation

## 2022-03-15 LAB — CBC
HCT: 42.1 % (ref 39.0–52.0)
Hemoglobin: 14.4 g/dL (ref 13.0–17.0)
MCH: 28.7 pg (ref 26.0–34.0)
MCHC: 34.2 g/dL (ref 30.0–36.0)
MCV: 84 fL (ref 80.0–100.0)
Platelets: 218 10*3/uL (ref 150–400)
RBC: 5.01 MIL/uL (ref 4.22–5.81)
RDW: 13.5 % (ref 11.5–15.5)
WBC: 7.5 10*3/uL (ref 4.0–10.5)
nRBC: 0 % (ref 0.0–0.2)

## 2022-03-15 LAB — BASIC METABOLIC PANEL
Anion gap: 7 (ref 5–15)
BUN: 20 mg/dL (ref 8–23)
CO2: 23 mmol/L (ref 22–32)
Calcium: 9.1 mg/dL (ref 8.9–10.3)
Chloride: 105 mmol/L (ref 98–111)
Creatinine, Ser: 1.34 mg/dL — ABNORMAL HIGH (ref 0.61–1.24)
GFR, Estimated: 55 mL/min — ABNORMAL LOW (ref >60)
Glucose, Bld: 139 mg/dL — ABNORMAL HIGH (ref 70–99)
Potassium: 4.2 mmol/L (ref 3.5–5.1)
Sodium: 135 mmol/L (ref 135–145)

## 2022-03-15 LAB — TROPONIN I (HIGH SENSITIVITY)
Troponin I (High Sensitivity): 4 ng/L (ref <18)
Troponin I (High Sensitivity): 4 ng/L (ref <18)

## 2022-03-15 NOTE — ED Provider Notes (Signed)
[facility] Provider Note   Event Date/Time   First MD Initiated Contact with Patient 03/15/22 1817     (approximate) History  Chest Pain  HPI Paul Goodman is a 76 y.o. male with a past medical history of COPD, type 2 diabetes, anxiety, and degenerative disc disease who presents from home complaining of acute onset chest pain that radiated to both arms and started after he laid down on the couch and took a nap secondary to a migraine.  Patient states that when he woke up and stood up he began feeling this acute 10/10 central chest pain that felt like it radiated from 1 wrist across his chest to the other and was associated with diaphoresis.  Patient states that the symptoms lasted approximately 30 minutes before slowly resolving spontaneously.  Patient states that it is only a dull ache at this time.  Patient denies any exertional worsening of this pain.  Patient denies any recurrence of this pain. ROS: Patient currently denies any vision changes, tinnitus, difficulty speaking, facial droop, sore throat, shortness of breath, abdominal pain, nausea/vomiting/diarrhea, dysuria, or weakness/numbness/paresthesias in any extremity   Physical Exam  Triage Vital Signs: ED Triage Vitals  Enc Vitals Group     BP 03/15/22 1454 124/82     Pulse Rate 03/15/22 1453 (!) 109     Resp 03/15/22 1453 20     Temp 03/15/22 1454 (!) 97.4 F (36.3 C)     Temp Source 03/15/22 1454 Oral     SpO2 03/15/22 1453 95 %     Weight --      Height --      Head Circumference --      Peak Flow --      Pain Score 03/15/22 1453 9     Pain Loc --      Pain Edu? --      Excl. in [place]? --    Most recent vital signs: Vitals:   03/15/22 1722 03/15/22 1951  BP: 107/83 129/74  Pulse: 60 71  Resp: 20 (!) 21  Temp:  97.7 F (36.5 C)  SpO2: 97% 97%   General: Awake, oriented x4. CV:  Good peripheral perfusion.  Resp:  Normal effort.  Abd:  No distention.  Other:  Elderly overweight  Caucasian male laying in bed in no distress ED Results / Procedures / Treatments  Labs (all labs ordered are listed, but only abnormal results are displayed) Labs Reviewed  BASIC METABOLIC PANEL - Abnormal; Notable for the following components:      Result Value   Glucose, Bld 139 (*)    Creatinine, Ser 1.34 (*)    GFR, Estimated 55 (*)    All other components within normal limits  CBC  TROPONIN I (HIGH SENSITIVITY)  TROPONIN I (HIGH SENSITIVITY)   EKG ED ECG REPORT I, Naaman Plummer, the attending physician, personally viewed and interpreted this ECG. Date: 03/15/2022 EKG Time: 1455 Rate: 69 Rhythm: Atrial fibrillation QRS Axis: normal Intervals: normal ST/T Wave abnormalities: normal Narrative Interpretation: Atrial fibrillation.  No evidence of acute ischemia RADIOLOGY ED MD interpretation: 2 view chest x-ray interpreted by me shows no evidence of acute abnormalities including no pneumonia, pneumothorax, or widened mediastinum -Agree with radiology assessment Official radiology report(s): DG Chest 2 View  Result Date: 03/15/2022 CLINICAL DATA:  Chest pain. EXAM: CHEST - 2 VIEW COMPARISON:  02/25/2021 FINDINGS: The cardiac silhouette, mediastinal and hilar contours are within normal limits and stable. Stable tortuosity and  calcification of the thoracic aorta. No acute pulmonary findings. No worrisome pulmonary lesions or pleural effusions. The bony thorax is intact. IMPRESSION: No acute cardiopulmonary findings. Electronically Signed   By: Marijo Sanes M.D.   On: 03/15/2022 15:10   PROCEDURES: Critical Care performed: No .1-3 Lead EKG Interpretation  Performed by: Naaman Plummer, MD Authorized by: Naaman Plummer, MD     Interpretation: abnormal     ECG rate:  72   ECG rate assessment: normal     Rhythm: atrial fibrillation     Ectopy: none     Conduction: normal    MEDICATIONS ORDERED IN ED: Medications - No data to display IMPRESSION / MDM / [place] /  ED COURSE  I reviewed the triage vital signs and the nursing notes.                             The patient is on the cardiac monitor to evaluate for evidence of arrhythmia and/or significant heart rate changes. Patient's presentation is most consistent with acute presentation with potential threat to life or bodily function. Workup: ECG, CXR, CBC, BMP, Troponin Findings: ECG: No overt evidence of STEMI. No evidence of Brugadas sign, delta wave, epsilon wave, significantly prolonged QTc, or malignant arrhythmia HS Troponin: Negative x1 Other Labs unremarkable for emergent problems. CXR: Without PTX, PNA, or widened mediastinum HEART Score: 4  Given History, Exam, and Workup I have low suspicion for ACS, Pneumothorax, Pneumonia, Pulmonary Embolus, Tamponade, Aortic Dissection or other emergent problem as a cause for this presentation.   Reassesment: Prior to discharge patients pain was controlled and they were well appearing.  Disposition:  Discharge. Strict return precautions discussed with patient with full understanding. Advised patient to follow up promptly with primary care provider    FINAL CLINICAL IMPRESSION(S) / ED DIAGNOSES   Final diagnoses:  Chest pain, unspecified type  Bilateral arm pain   Rx / DC Orders   ED Discharge Orders     None      Note:  This document was prepared using Dragon voice recognition software and may include unintentional dictation errors.   Naaman Plummer, MD 03/16/22 [phone]

## 2022-03-15 NOTE — ED Provider Triage Note (Signed)
Emergency Medicine Provider Triage Evaluation Note  Paul Goodman , a 76 y.o. male  was evaluated in triage.  Pt complains of chest pain that started just prior to arrival. He broke out in a sweat at onset. Cardiac history.  Physical Exam  BP 124/82   Pulse (!) 109   Temp (!) 97.4 F (36.3 C) (Oral)   Resp 20   SpO2 95%  Gen:   Awake, no distress   Resp:  Normal effort  MSK:   Moves extremities without difficulty  Other:    Medical Decision Making  Medically screening exam initiated at 2:55 PM.  Appropriate orders placed.  Paul Goodman was informed that the remainder of the evaluation will be completed by another provider, this initial triage assessment does not replace that evaluation, and the importance of remaining in the ED until their evaluation is complete.  Cardiac workup started.    Victorino Dike, FNP 03/15/22 1458

## 2022-03-15 NOTE — ED Notes (Signed)
Patient discharged at this time. Ambulated to lobby with independent and steady gait. Breathing unlabored speaking in full sentences. Verbalized understanding of all discharge and follow up teaching. Discharged homed with all belongings.

## 2022-03-15 NOTE — ED Triage Notes (Signed)
Pt to ED via POV from home. Pt reports CP that radiates to both arms. Pt reports pain started after he laid down due to a migraine and woke up with the pain upon standing and was diaphoretic. Pt on blood thinners. Pt with hx CHF, HTN, COPD

## 2022-04-11 NOTE — Progress Notes (Signed)
Referring Physician:  [name], MD [address] [place],  [place] [address]  Primary Physician:  [name], MD  History of Present Illness: 04/13/2022 Mr. Merced Hanners has a history of COPD, type 2 diabetes, anxiety, history of CVA, cardiomyopathy, afib, CHF, OSA with CPAP, and degenerative disc disease.   Seen in ED on 03/15/22 for chest pain that radiated into both arms. This lasted for 30 minutes and was improved by the time he was seen in ED. Cardiac workup was negative.   He was referred here for follow up.   He has constant neck pain that radiates into his shoulders, shoulder blades, and into his back x 35 years. He had injury in [place] in 1980s (was almost crushed with 800lb piece of equipment)- was told he had injury to C3 and L4-L5. He notes popping when he turns his neck. He has no current radicular arm pain. No numbness, tingling in his arms. He notes diffuse weakness that he contributes to his heart issues.   He is on ELIQUIS.   Conservative measures:  Physical therapy: none  Multimodal medical therapy including regular antiinflammatories: none, on ELIQUIS  Injections: No epidural steroid injections  Past Surgery: no spinal surgery   Sharlene Dory has some chronic balance issues. He has no issues with hand dexterity.   The symptoms are causing a significant impact on the patient's life.   Review of Systems:  A 10 point review of systems is negative, except for the pertinent positives and negatives detailed in the HPI.  Past Medical History: Past Medical History:  Diagnosis Date   Anxiety    Arthritis    CHF (congestive heart failure) (HCC)    COPD (chronic obstructive pulmonary disease) ([place])    Diabetes ([place])    ED (erectile dysfunction)    Heart murmur    Hyperlipidemia    Hypertension    Melanoma ([place])    Sleep apnea     Past Surgical History: Past Surgical History:  Procedure Laterality Date   EYE SURGERY  Right    cancer   HAND SURGERY Left 1966   KNEE SURGERY Left 1973   TONSILLECTOMY  1964    Allergies: Allergies as of 04/13/2022   (No Known Allergies)    Medications: Outpatient Encounter Medications as of 04/13/2022  Medication Sig   albuterol (PROVENTIL HFA;VENTOLIN HFA) 108 (90 BASE) MCG/ACT inhaler INHALE 2 PUFFS EVERY 4 - 6 HOURS AS NEEDED   amiodarone (PACERONE) 200 MG tablet Take 1 tablet (200 mg total) by mouth 2 (two) times daily.   apixaban (ELIQUIS) 5 MG TABS tablet Take 1 tablet (5 mg total) by mouth 2 (two) times daily.   atorvastatin (LIPITOR) 80 MG tablet Take 1 tablet (80 mg total) by mouth daily.   carvedilol (COREG) 25 MG tablet Take 1 tablet (25 mg total) by mouth 2 (two) times daily with a meal.   FARXIGA 10 MG TABS tablet Take 10 mg by mouth daily.   fenofibrate 160 MG tablet Take 160 mg by mouth daily.   furosemide (LASIX) 20 MG tablet Take 20 mg by mouth daily as needed.   glipiZIDE (GLUCOTROL XL) 5 MG 24 hr tablet Take 10 mg by mouth daily. 2 tablets daily   glucose blood test strip USE AS DIRECTED TWICE DAILY   Insulin Pen Needle 32G X 4 MM MISC USE AS DIRECTED   LORazepam (ATIVAN) 1 MG tablet Take 0.5-1 mg by mouth daily as needed. TAKE  ONE-HALF (1/2) TO ONE (1) TABLET BY MOUTH DAILY AS NEEDED FOR ANXIETY, PANIC, OR THROAT SWELLING   PARoxetine (PAXIL) 20 MG tablet Take 20 mg by mouth daily.   rOPINIRole (REQUIP) 0.5 MG tablet Take 3 tablets by mouth at bedtime.   sacubitril-valsartan (ENTRESTO) 24-26 MG Take 1 tablet by mouth 2 (two) times daily.   SOLIQUA 100-33 UNT-MCG/ML SOPN Inject 60 Units into the skin in the morning.   spironolactone (ALDACTONE) 25 MG tablet Take 0.5 tablets (12.5 mg total) by mouth daily.   tiotropium (SPIRIVA) 18 MCG inhalation capsule Place 18 mcg into inhaler and inhale daily as needed.   No facility-administered encounter medications on file as of 04/13/2022.    Social History: Social History   Tobacco Use   Smoking  status: Former    Packs/day: 0.00    Types: Cigarettes   Smokeless tobacco: Former   Tobacco comments:    quit 1992  Vaping Use   Vaping Use: Never used  Substance Use Topics   Alcohol use: Not Currently    Alcohol/week: 14.0 standard drinks of alcohol    Types: 14 Standard drinks or equivalent per week   Drug use: Yes    Types: Marijuana    Family Medical History: Family History  Problem Relation Age of Onset   Kidney Stones Father    Kidney disease Neg Hx    Prostate cancer Neg Hx    Kidney cancer Neg Hx    Bladder Cancer Neg Hx     Physical Examination: Vitals:   04/13/22 0905  BP: 118/68    General: Patient is well developed, well nourished, calm, collected, and in no apparent distress. Attention to examination is appropriate.  Respiratory: Patient is breathing without any difficulty.   NEUROLOGICAL:     Awake, alert, oriented to person, place, and time.  Speech is clear and fluent. Fund of knowledge is appropriate.   Cranial Nerves: Pupils equal round and reactive to light.  Facial tone is symmetric.    ROM of spine:  Limited ROM of cervical spine with pain  No abnormal lesions on exposed skin.   Strength: Side Biceps Triceps Deltoid Interossei Grip Wrist Ext. Wrist Flex.  R '5 5 5 5 5 5 5  '$ L '5 5 5 5 5 5 5   '$ Side Iliopsoas Quads Hamstring PF DF EHL  R '5 5 5 5 5 5  '$ L '5 5 5 5 5 5   '$ Reflexes are 2+ and symmetric at the biceps, triceps, brachioradialis, patella and achilles.   Hoffman's is absent.  Clonus is not present.   Bilateral upper and lower extremity sensation is intact to light touch.    No evidence of dysmetria noted.  Gait is normal.    No posterior cervical tenderness. He has mild bilateral trapezial tenderness.   Medical Decision Making  Imaging: No spinal imaging available for review.   Assessment and Plan: Mr. Mcgrail is a pleasant 76 y.o. male with constant neck pain that radiates into his shoulders, shoulder blades, and into  his back x 35 years. He had injury in [place] in 1980s (was almost crushed with 800lb piece of equipment). He has no current radicular arm pain.   No spinal imaging available for my review.   Treatment options discussed with patient and following plan made:   - Xrays of cervical spine to be done today.  - We discussed trial of PT, he wants to hold off until we review xrays.  - Message sent  to cardiology [facility])- he is not taking eliquis as he is out of it. Per Dr. Etta Quill last note he is to continue it.  - Chronic balance issues noted. No other signs/symptoms of cervical myelopathy.  - Will schedule for phone visit once I have his xray results.   I spent a total of 30 minutes in face-to-face and non-face-to-face activities related to this patient's care toda including review of outside records, review of imaging, review of symptoms, physical exam, discussion of differential diagnosis, discussion of treatment options, and documentation.   Thank you for involving me in the care of this patient.   Geronimo Boot PA-C Dept. of Neurosurgery

## 2022-04-13 ENCOUNTER — Ambulatory Visit
Admission: RE | Admit: 2022-04-13 | Discharge: 2022-04-13 | Disposition: A | Discharge status: Home / Self Care | Payer: Federal, State, Local not specified - PPO | Source: Ambulatory Visit | Attending: Orthopedic Surgery | Admitting: Orthopedic Surgery

## 2022-04-13 ENCOUNTER — Ambulatory Visit: Payer: Federal, State, Local not specified - PPO | Admitting: Orthopedic Surgery

## 2022-04-13 ENCOUNTER — Encounter: Payer: Self-pay | Admitting: Orthopedic Surgery

## 2022-04-13 ENCOUNTER — Ambulatory Visit
Admission: RE | Admit: 2022-04-13 | Discharge: 2022-04-13 | Disposition: A | Discharge status: Home / Self Care | Payer: Federal, State, Local not specified - PPO | Attending: Orthopedic Surgery | Admitting: Orthopedic Surgery

## 2022-04-13 VITALS — BP 118/68 | Ht 74.0 in | Wt 257.2 lb

## 2022-04-13 DIAGNOSIS — M7918 Myalgia, other site: Secondary | ICD-10-CM

## 2022-04-13 DIAGNOSIS — M542 Cervicalgia: Secondary | ICD-10-CM

## 2022-04-13 NOTE — Patient Instructions (Signed)
It was so nice to see you today, I am sorry that you are hurting so much.   I want to get some xrays of your neck to get more information. You can go across she street to [place]. The order is in and you do not need an appointment.   Once I get the results back, we will call you to set up a phone visit.   I sent a message to Dr. Clayborn Bigness about your eliquis. I would reach out to them as well.   Depending on the results of your xrays, we may consider physical therapy.   Please do not hesitate to call if you have any questions or concerns. You can also message me in [place].   Geronimo Boot PA-C [phone]

## 2022-04-19 NOTE — Progress Notes (Signed)
   Telephone Visit- Progress Note: Referring Physician:  [name], MD [address] [place],  [place] [address]  Primary Physician:  Paul Pink, MD  This visit was performed via telephone.  Patient location: home Provider location: office  I spent a total of 10 minutes non-face-to-face activities for this visit on the date of this encounter including review of current clinical condition and response to treatment.    Patient has given verbal consent to this telephone visits and we reviewed the limitations of a telephone visit. Patient wishes to proceed.      Chief Complaint:  review of cervical xrays  History of Present Illness: Paul Goodman is a 76 y.o. male was last seen by me on 04/13/22 for chronic constant neck pain with radiation into shoulders, shoulder blades, and back.   His wife was in MVA and their only car is in the shop, dealing with a lot of stress regarding this. His wife was driving and she is okay. He helps take care of her in general as she is 3 time cancer survivor.    He continues with constant neck pain that radiates into his shoulders, shoulder blades, and into his back x years. He had injury in [place] in 54s. He has no current radicular arm pain. No numbness, tingling in his arms.  He states he is not on eliquis and that cardiology wanted to change him to another medication. It was over $350 and he is not taking it.    Exam: No exam done as this was a telephone encounter.     Imaging: Cervical xrays dated 04/13/22:  FINDINGS: No fracture, dislocation or subluxation. No spondylolisthesis. No osteolytic or osteoblastic changes. Prevertebral and cervical cranial soft tissues are unremarkable. No motion observed with flexion and extension to suggest instability.   Degenerative disc disease noted with disc space narrowing and marginal osteophytes at C2 through C7 facet joint sclerosis and osteophyte formation C2-3  through C6-7   IMPRESSION: Degenerative changes. No acute traumatic abnormality. No evidence of instability.     Electronically Signed   By: Paul Goodman M.D.   On: 04/14/2022 14:03       I have personally reviewed the images and agree with the above interpretation.  Assessment and Plan: Paul Goodman is a pleasant 76 y.o. male with constant neck pain that radiates into his shoulders, shoulder blades, and into his back x 35 years. No arm pain. He had injury in [place] in 79s.    He has know cervical spondylosis and DDD. This is likely causing his pain.    Treatment options discussed with patient and following plan made:    - Recommend PT for cervical spine. He currently does not have transportation.  - Can also consider cervical injections with PMR. Would need cervical MRI.  - Advised him to follow up with cardiology [facility]) regarding eliquis and new medication he was given (he refused it, it was over $350). He also is taking prn mobic and was advised to discuss this with cardiology as well. He should not be taking mobic with a blood thinner such as eliquis.  - He is dealing with too much right now to decide on treatment for his neck pain. Will call him in a month to check on him and regroup.   Paul Boot PA-C Neurosurgery

## 2022-04-23 ENCOUNTER — Ambulatory Visit (INDEPENDENT_AMBULATORY_CARE_PROVIDER_SITE_OTHER): Payer: Federal, State, Local not specified - PPO | Admitting: Orthopedic Surgery

## 2022-04-23 ENCOUNTER — Encounter: Payer: Self-pay | Admitting: Orthopedic Surgery

## 2022-04-23 DIAGNOSIS — M5030 Other cervical disc degeneration, unspecified cervical region: Secondary | ICD-10-CM

## 2022-04-23 DIAGNOSIS — M47812 Spondylosis without myelopathy or radiculopathy, cervical region: Secondary | ICD-10-CM

## 2022-05-28 ENCOUNTER — Telehealth: Payer: Self-pay | Admitting: Orthopedic Surgery

## 2022-05-28 NOTE — Telephone Encounter (Signed)
Called patient to regroup after his phone visit in December. I wanted to discuss treatment options with him again.   Left a message on his VM to return my call.

## 2022-08-06 ENCOUNTER — Encounter: Payer: Self-pay | Admitting: Emergency Medicine

## 2022-08-06 ENCOUNTER — Inpatient Hospital Stay
Admission: EM | Admit: 2022-08-06 | Discharge: 2022-08-08 | DRG: 638 | Disposition: A | Discharge status: Home / Self Care | Payer: Medicare Other | Attending: Internal Medicine | Admitting: Internal Medicine

## 2022-08-06 ENCOUNTER — Emergency Department: Payer: Medicare Other

## 2022-08-06 ENCOUNTER — Inpatient Hospital Stay: Payer: Medicare Other

## 2022-08-06 ENCOUNTER — Other Ambulatory Visit: Payer: Self-pay

## 2022-08-06 DIAGNOSIS — M79675 Pain in left toe(s): Secondary | ICD-10-CM | POA: Diagnosis present

## 2022-08-06 DIAGNOSIS — E11628 Type 2 diabetes mellitus with other skin complications: Principal | ICD-10-CM | POA: Diagnosis present

## 2022-08-06 DIAGNOSIS — I10 Essential (primary) hypertension: Secondary | ICD-10-CM | POA: Diagnosis present

## 2022-08-06 DIAGNOSIS — E785 Hyperlipidemia, unspecified: Secondary | ICD-10-CM | POA: Diagnosis present

## 2022-08-06 DIAGNOSIS — I130 Hypertensive heart and chronic kidney disease with heart failure and stage 1 through stage 4 chronic kidney disease, or unspecified chronic kidney disease: Secondary | ICD-10-CM | POA: Diagnosis present

## 2022-08-06 DIAGNOSIS — M869 Osteomyelitis, unspecified: Secondary | ICD-10-CM | POA: Diagnosis present

## 2022-08-06 DIAGNOSIS — E669 Obesity, unspecified: Secondary | ICD-10-CM | POA: Diagnosis present

## 2022-08-06 DIAGNOSIS — E860 Dehydration: Secondary | ICD-10-CM | POA: Diagnosis present

## 2022-08-06 DIAGNOSIS — Z79899 Other long term (current) drug therapy: Secondary | ICD-10-CM | POA: Diagnosis not present

## 2022-08-06 DIAGNOSIS — M84478A Pathological fracture, left toe(s), initial encounter for fracture: Secondary | ICD-10-CM | POA: Diagnosis present

## 2022-08-06 DIAGNOSIS — Z7985 Long-term (current) use of injectable non-insulin antidiabetic drugs: Secondary | ICD-10-CM

## 2022-08-06 DIAGNOSIS — S92422B Displaced fracture of distal phalanx of left great toe, initial encounter for open fracture: Secondary | ICD-10-CM

## 2022-08-06 DIAGNOSIS — E1122 Type 2 diabetes mellitus with diabetic chronic kidney disease: Secondary | ICD-10-CM | POA: Diagnosis present

## 2022-08-06 DIAGNOSIS — J449 Chronic obstructive pulmonary disease, unspecified: Secondary | ICD-10-CM | POA: Diagnosis present

## 2022-08-06 DIAGNOSIS — E119 Type 2 diabetes mellitus without complications: Secondary | ICD-10-CM

## 2022-08-06 DIAGNOSIS — E1169 Type 2 diabetes mellitus with other specified complication: Secondary | ICD-10-CM | POA: Diagnosis present

## 2022-08-06 DIAGNOSIS — L089 Local infection of the skin and subcutaneous tissue, unspecified: Secondary | ICD-10-CM | POA: Diagnosis not present

## 2022-08-06 DIAGNOSIS — I509 Heart failure, unspecified: Secondary | ICD-10-CM | POA: Diagnosis present

## 2022-08-06 DIAGNOSIS — I482 Chronic atrial fibrillation, unspecified: Secondary | ICD-10-CM | POA: Diagnosis present

## 2022-08-06 DIAGNOSIS — L03032 Cellulitis of left toe: Secondary | ICD-10-CM

## 2022-08-06 DIAGNOSIS — Z85820 Personal history of malignant melanoma of skin: Secondary | ICD-10-CM

## 2022-08-06 DIAGNOSIS — N182 Chronic kidney disease, stage 2 (mild): Secondary | ICD-10-CM | POA: Diagnosis present

## 2022-08-06 DIAGNOSIS — Z7984 Long term (current) use of oral hypoglycemic drugs: Secondary | ICD-10-CM

## 2022-08-06 DIAGNOSIS — S91112A Laceration without foreign body of left great toe without damage to nail, initial encounter: Secondary | ICD-10-CM | POA: Diagnosis present

## 2022-08-06 DIAGNOSIS — Z87891 Personal history of nicotine dependence: Secondary | ICD-10-CM

## 2022-08-06 DIAGNOSIS — Z6830 Body mass index (BMI) 30.0-30.9, adult: Secondary | ICD-10-CM

## 2022-08-06 DIAGNOSIS — Z794 Long term (current) use of insulin: Secondary | ICD-10-CM | POA: Diagnosis not present

## 2022-08-06 LAB — BASIC METABOLIC PANEL
Anion gap: 12 (ref 5–15)
BUN: 29 mg/dL — ABNORMAL HIGH (ref 8–23)
CO2: 23 mmol/L (ref 22–32)
Calcium: 9.2 mg/dL (ref 8.9–10.3)
Chloride: 100 mmol/L (ref 98–111)
Creatinine, Ser: 1.26 mg/dL — ABNORMAL HIGH (ref 0.61–1.24)
GFR, Estimated: 59 mL/min — ABNORMAL LOW (ref >60)
Glucose, Bld: 150 mg/dL — ABNORMAL HIGH (ref 70–99)
Potassium: 3.8 mmol/L (ref 3.5–5.1)
Sodium: 135 mmol/L (ref 135–145)

## 2022-08-06 LAB — GLUCOSE, CAPILLARY: Glucose-Capillary: 129 mg/dL — ABNORMAL HIGH (ref 70–99)

## 2022-08-06 LAB — CBC WITH DIFFERENTIAL/PLATELET
Abs Immature Granulocytes: 0.03 10*3/uL (ref 0.00–0.07)
Basophils Absolute: 0 10*3/uL (ref 0.0–0.1)
Basophils Relative: 0 %
Eosinophils Absolute: 0.1 10*3/uL (ref 0.0–0.5)
Eosinophils Relative: 1 %
HCT: 44.3 % (ref 39.0–52.0)
Hemoglobin: 15 g/dL (ref 13.0–17.0)
Immature Granulocytes: 0 %
Lymphocytes Relative: 21 %
Lymphs Abs: 2 10*3/uL (ref 0.7–4.0)
MCH: 28.8 pg (ref 26.0–34.0)
MCHC: 33.9 g/dL (ref 30.0–36.0)
MCV: 85.2 fL (ref 80.0–100.0)
Monocytes Absolute: 0.8 10*3/uL (ref 0.1–1.0)
Monocytes Relative: 8 %
Neutro Abs: 6.5 10*3/uL (ref 1.7–7.7)
Neutrophils Relative %: 70 %
Platelets: 242 10*3/uL (ref 150–400)
RBC: 5.2 MIL/uL (ref 4.22–5.81)
RDW: 12.9 % (ref 11.5–15.5)
WBC: 9.3 10*3/uL (ref 4.0–10.5)
nRBC: 0 % (ref 0.0–0.2)

## 2022-08-06 LAB — PREALBUMIN: Prealbumin: 16 mg/dL — ABNORMAL LOW (ref 18–38)

## 2022-08-06 LAB — URIC ACID: Uric Acid, Serum: 5.3 mg/dL (ref 3.7–8.6)

## 2022-08-06 LAB — C-REACTIVE PROTEIN: CRP: 4.3 mg/dL — ABNORMAL HIGH (ref <1.0)

## 2022-08-06 LAB — MRSA NEXT GEN BY PCR, NASAL: MRSA by PCR Next Gen: DETECTED — AB

## 2022-08-06 LAB — SEDIMENTATION RATE: Sed Rate: 19 mm/hr (ref 0–20)

## 2022-08-06 MED ORDER — INSULIN GLARGINE-YFGN 100 UNIT/ML ~~LOC~~ SOLN
30.0000 [IU] | Freq: Every day | SUBCUTANEOUS | Status: DC
  Administered 2022-08-06 – 2022-08-07 (×2): 30 [IU] via SUBCUTANEOUS
  Filled 2022-08-06 (×4): qty 0.3

## 2022-08-06 MED ORDER — ENOXAPARIN SODIUM 60 MG/0.6ML IJ SOSY
0.5000 mg/kg | PREFILLED_SYRINGE | INTRAMUSCULAR | Status: DC
  Administered 2022-08-06 – 2022-08-07 (×2): 55 mg via SUBCUTANEOUS
  Filled 2022-08-06 (×2): qty 0.6

## 2022-08-06 MED ORDER — KETOROLAC TROMETHAMINE 15 MG/ML IJ SOLN
15.0000 mg | Freq: Once | INTRAMUSCULAR | Status: AC
  Administered 2022-08-06: 15 mg via INTRAVENOUS
  Filled 2022-08-06: qty 1

## 2022-08-06 MED ORDER — AMIODARONE HCL 200 MG PO TABS
200.0000 mg | ORAL_TABLET | Freq: Two times a day (BID) | ORAL | Status: DC
  Administered 2022-08-06 – 2022-08-08 (×4): 200 mg via ORAL
  Filled 2022-08-06 (×4): qty 1

## 2022-08-06 MED ORDER — METRONIDAZOLE 500 MG/100ML IV SOLN
500.0000 mg | Freq: Two times a day (BID) | INTRAVENOUS | Status: DC
  Administered 2022-08-06 – 2022-08-08 (×5): 500 mg via INTRAVENOUS
  Filled 2022-08-06 (×5): qty 100

## 2022-08-06 MED ORDER — ROSUVASTATIN CALCIUM 5 MG PO TABS
5.0000 mg | ORAL_TABLET | Freq: Every day | ORAL | Status: DC
  Administered 2022-08-07: 5 mg via ORAL
  Filled 2022-08-06: qty 1

## 2022-08-06 MED ORDER — LIDOCAINE 5 % EX PTCH
1.0000 | MEDICATED_PATCH | CUTANEOUS | Status: DC
  Administered 2022-08-06 – 2022-08-07 (×2): 1 via TRANSDERMAL
  Filled 2022-08-06 (×2): qty 1

## 2022-08-06 MED ORDER — VANCOMYCIN HCL 1250 MG/250ML IV SOLN
1250.0000 mg | Freq: Once | INTRAVENOUS | Status: DC
  Filled 2022-08-06 (×2): qty 250

## 2022-08-06 MED ORDER — MUPIROCIN 2 % EX OINT
1.0000 | TOPICAL_OINTMENT | Freq: Two times a day (BID) | CUTANEOUS | Status: DC
  Administered 2022-08-06 – 2022-08-08 (×4): 1 via NASAL
  Filled 2022-08-06: qty 22

## 2022-08-06 MED ORDER — VANCOMYCIN HCL IN DEXTROSE 1-5 GM/200ML-% IV SOLN
1000.0000 mg | Freq: Once | INTRAVENOUS | Status: AC
  Administered 2022-08-06: 1000 mg via INTRAVENOUS
  Filled 2022-08-06: qty 200

## 2022-08-06 MED ORDER — CHLORHEXIDINE GLUCONATE CLOTH 2 % EX PADS
6.0000 | MEDICATED_PAD | Freq: Every day | CUTANEOUS | Status: DC
  Administered 2022-08-07 – 2022-08-08 (×2): 6 via TOPICAL

## 2022-08-06 MED ORDER — SODIUM CHLORIDE 0.9 % IV SOLN
2.0000 g | Freq: Once | INTRAVENOUS | Status: AC
  Administered 2022-08-06: 2 g via INTRAVENOUS
  Filled 2022-08-06: qty 20

## 2022-08-06 MED ORDER — SODIUM CHLORIDE 0.9 % IV SOLN: INTRAVENOUS | Status: DC

## 2022-08-06 MED ORDER — INSULIN ASPART 100 UNIT/ML IJ SOLN
4.0000 [IU] | Freq: Three times a day (TID) | INTRAMUSCULAR | Status: DC
  Administered 2022-08-06 – 2022-08-08 (×5): 4 [IU] via SUBCUTANEOUS
  Filled 2022-08-06 (×4): qty 1

## 2022-08-06 MED ORDER — SODIUM CHLORIDE 0.9 % IV SOLN
2.0000 g | INTRAVENOUS | Status: DC
  Administered 2022-08-07 – 2022-08-08 (×2): 2 g via INTRAVENOUS
  Filled 2022-08-06 (×2): qty 20

## 2022-08-06 MED ORDER — LORAZEPAM 0.5 MG PO TABS: 0.5000 mg | ORAL_TABLET | Freq: Every day | ORAL | Status: DC | PRN

## 2022-08-06 MED ORDER — GENTAMICIN SULFATE 0.1 % EX OINT
TOPICAL_OINTMENT | Freq: Every day | CUTANEOUS | Status: DC
  Filled 2022-08-06: qty 15

## 2022-08-06 MED ORDER — INSULIN ASPART 100 UNIT/ML IJ SOLN: 0.0000 [IU] | Freq: Every day | INTRAMUSCULAR | Status: DC

## 2022-08-06 MED ORDER — GABAPENTIN 100 MG PO CAPS
100.0000 mg | ORAL_CAPSULE | Freq: Three times a day (TID) | ORAL | Status: DC
  Administered 2022-08-06 – 2022-08-08 (×5): 100 mg via ORAL
  Filled 2022-08-06 (×6): qty 1

## 2022-08-06 MED ORDER — CARVEDILOL 25 MG PO TABS
25.0000 mg | ORAL_TABLET | Freq: Two times a day (BID) | ORAL | Status: DC
  Administered 2022-08-06 – 2022-08-08 (×4): 25 mg via ORAL
  Filled 2022-08-06 (×4): qty 1

## 2022-08-06 MED ORDER — INSULIN ASPART 100 UNIT/ML IJ SOLN
0.0000 [IU] | Freq: Three times a day (TID) | INTRAMUSCULAR | Status: DC
  Administered 2022-08-06: 3 [IU] via SUBCUTANEOUS
  Administered 2022-08-07 (×2): 2 [IU] via SUBCUTANEOUS
  Administered 2022-08-07: 3 [IU] via SUBCUTANEOUS
  Administered 2022-08-08: 2 [IU] via SUBCUTANEOUS
  Administered 2022-08-08: 5 [IU] via SUBCUTANEOUS
  Filled 2022-08-06 (×5): qty 1

## 2022-08-06 NOTE — Assessment & Plan Note (Signed)
BP stable Titrate home regimen 

## 2022-08-06 NOTE — Assessment & Plan Note (Signed)
SSI A1c 

## 2022-08-06 NOTE — ED Triage Notes (Signed)
Pt to ED via POV for c/o his toenail coming off. Pt states that this has happened before. Pt states that he is diabetic. Pt's great toe on his left food, is red and swollen. Pt denies fever or chills at home and is afebrile at this time.

## 2022-08-06 NOTE — Assessment & Plan Note (Signed)
Stable from respiratory standpoint Continue home regimen

## 2022-08-06 NOTE — Assessment & Plan Note (Signed)
Worsening left great toe redness swelling and drainage after toenail removal but patient x 1 week No fevers or chills.?  Purulent drainage by the patient White count 9.3, sed rate 19 Plain films with?  Comminuted fracture of great toe and soft tissue swelling Pending podiatry evaluation Blood sugars in the 150s on presentation Will place on Rocephin, Flagyl and vancomycin for infectious coverage for now Pending MRI of the foot per podiatry orders Will follow-up on formal podiatry recommendations Pain control Follow

## 2022-08-06 NOTE — Assessment & Plan Note (Signed)
Cont statin

## 2022-08-06 NOTE — Assessment & Plan Note (Signed)
Baseline Cr 09.4-1.3  Cr 1.26 today  GFR in the 50s  Follow

## 2022-08-06 NOTE — Consult Note (Signed)
PHARMACY -  BRIEF ANTIBIOTIC NOTE   Pharmacy has received consult(s) for vancomycin from an ED provider.  The patient's profile has been reviewed for ht/wt/allergies/indication/available labs.    One time order(s) placed for  Vancomycin 2250 mg ( 1000 mg followed by 1250 mg)  Further antibiotics/pharmacy consults should be ordered by admitting physician if indicated.                       Thank you, Dorothe Pea, PharmD, BCPS Clinical Pharmacist   08/06/2022  10:59 AM

## 2022-08-06 NOTE — Assessment & Plan Note (Signed)
Stable  Cont amiodarone  Hold eliquis for now pending podiatry eval  Follow

## 2022-08-06 NOTE — ED Provider Notes (Signed)
[facility] Provider Note    Event Date/Time   First MD Initiated Contact with Patient 08/06/22 1029     (approximate)   History   Wound Check   HPI  Paul Goodman is a 77 y.o. male  here with foot pain.  Patient states that over the last several days he has had progressively worsening left great toe pain.  He states that he has had recurrent pain of this toe since he lost his nail several years ago.  He states that about once a year he loses nail.  Earlier this week, it began rising up and he took it off with a pair of pliers.  Since then, he has had increasing swelling and some drainage to the area.  He has had superficial wounds that popped up proximal to the nail.  He states he has had some throbbing pain.  No known fevers.     Physical Exam   Triage Vital Signs: ED Triage Vitals [08/06/22 1020]  Enc Vitals Group     BP 132/69     Pulse Rate (!) 59     Resp 16     Temp 98.4 F (36.9 C)     Temp Source Oral     SpO2 96 %     Weight 241 lb (109.3 kg)     Height 6\' 2"  (1.88 m)     Head Circumference      Peak Flow      Pain Score 0     Pain Loc      Pain Edu?      Excl. in [place]?     Most recent vital signs: Vitals:   08/06/22 1020  BP: 132/69  Pulse: (!) 59  Resp: 16  Temp: 98.4 F (36.9 C)  SpO2: 96%     General: Awake, no distress.  CV:  Good peripheral perfusion.  Resp:  Normal work of breathing.  Abd:  No distention.  Other:  Significant swelling and induration of the left great toe with wound over the nailbed.  There is streaking erythema up to the midfoot.  Moderate warmth.  No expressible purulence.  Distal perfusion is normal with good cap refill.       ED Results / Procedures / Treatments   Labs (all labs ordered are listed, but only abnormal results are displayed) Labs Reviewed  BASIC METABOLIC PANEL - Abnormal; Notable for the following components:      Result Value   Glucose, Bld 150 (*)    BUN 29 (*)     Creatinine, Ser 1.26 (*)    GFR, Estimated 59 (*)    All other components within normal limits  CULTURE, BLOOD (SINGLE)  CULTURE, BLOOD (SINGLE)  CBC WITH DIFFERENTIAL/PLATELET  SEDIMENTATION RATE     EKG    RADIOLOGY X-ray: Comminuted fracture of the left great toe   I also independently reviewed and agree with radiologist interpretations.   PROCEDURES:  Critical Care performed: No   MEDICATIONS ORDERED IN ED: Medications  vancomycin (VANCOCIN) IVPB 1000 mg/200 mL premix (has no administration in time range)    Followed by  vancomycin (VANCOREADY) IVPB 1250 mg/250 mL (has no administration in time range)  cefTRIAXone (ROCEPHIN) 2 g in sodium chloride 0.9 % 100 mL IVPB (2 g Intravenous New Bag/Given 08/06/22 1111)     IMPRESSION / MDM / ASSESSMENT AND PLAN / ED COURSE  I reviewed the triage vital signs and the nursing notes.  Differential diagnosis includes, but is not limited to, osteo-, cellulitis, ingrown nail, abscess, vascular compromise  Patient's presentation is most consistent with acute presentation with potential threat to life or bodily function.  77 year old male here with left great toe pain.  Clinically, concern for possible osteomyelitis or at least significant cellulitis.  No expressible purulence or evidence of abscess.  Plain films show fracture which is somewhat concerning given absence of trauma.  White count normal.  Lab work otherwise reassuring.  BMP with possible mild dehydration.  Case discussed with Dr. Sherryle Lis of podiatry and will admit to hospital service for IV antibiotics.     FINAL CLINICAL IMPRESSION(S) / ED DIAGNOSES   Final diagnoses:  Cellulitis of great toe of left foot     Rx / DC Orders   ED Discharge Orders     None        Note:  This document was prepared using Dragon voice recognition software and may include unintentional dictation errors.   Duffy Bruce, MD 08/06/22 [id number]

## 2022-08-06 NOTE — H&P (Signed)
History and Physical    Patient: Paul Goodman DOB: 04/01/1946 DOA: 08/06/2022 DOS: the patient was seen and examined on 08/06/2022 PCP: [name], MD  Patient coming from: Home  Chief Complaint:  Chief Complaint  Patient presents with   Wound Check   HPI: Paul Goodman is a 77 y.o. male with medical history significant of type 2 diabetes, CHF, COPD, hyperlipidemia, hypertension presenting with diabetic foot infection.  Patient reports having recurrent issues with the great toe with nail sometimes, and often associated redness.  Patient reports that he had some manipulation of the toe nail around 1 week ago when the toenail spontaneously came off.  Has had some significant redness swelling with toenail regrowth per patient.  No fevers or chills.?  Purulent drainage.  No nausea or vomiting.  Redness and swelling has progressively worsened.  Blood sugars were well-controlled at home for vision.  Blood sugars usually in the 150s.  Non-smoker.  Patient denies any alcohol use. Presented to the ER afebrile, hemodynamically stable.  White count 9.3, hemoglobin 15, creatinine 1.26, glucose 150, sed rate within normal limits.  Plain films of the great toe with concern for comminuted fracture and soft tissue swelling.  Review of Systems: As mentioned in the history of present illness. All other systems reviewed and are negative. Past Medical History:  Diagnosis Date   Anxiety    Arthritis    CHF (congestive heart failure)    COPD (chronic obstructive pulmonary disease)    Diabetes    ED (erectile dysfunction)    Heart murmur    Hyperlipidemia    Hypertension    Melanoma    Sleep apnea    Past Surgical History:  Procedure Laterality Date   EYE SURGERY Right    cancer   HAND SURGERY Left 1966   KNEE SURGERY Left 1973   TONSILLECTOMY  1964   Social History:  reports that he has quit smoking. His smoking use included cigarettes. He has quit using smokeless tobacco.  He reports that he does not currently use alcohol after a past usage of about 14.0 standard drinks of alcohol per week. He reports current drug use. Drug: Marijuana.  No Known Allergies  Family History  Problem Relation Age of Onset   Kidney Stones Father    Kidney disease Neg Hx    Prostate cancer Neg Hx    Kidney cancer Neg Hx    Bladder Cancer Neg Hx     Prior to Admission medications   Medication Sig Start Date End Date Taking? Authorizing Provider  carvedilol (COREG) 25 MG tablet Take 1 tablet (25 mg total) by mouth 2 (two) times daily with a meal. 06/06/20  Yes Fritzi Mandes, MD  gabapentin (NEURONTIN) 100 MG capsule Take 100 mg by mouth 3 (three) times daily. 06/29/22  Yes [provider]  hydrochlorothiazide (HYDRODIURIL) 25 MG tablet Take 25 mg by mouth daily. 06/21/22  Yes [provider]  Insulin Glargine (BASAGLAR KWIKPEN) 100 UNIT/ML SMARTSIG:60 Unit(s) SUB-Q Every Night 06/16/22  Yes [provider]  LORazepam (ATIVAN) 1 MG tablet Take 0.5-1 mg by mouth daily as needed. TAKE ONE-HALF (1/2) TO ONE (1) TABLET BY MOUTH DAILY AS NEEDED FOR ANXIETY, PANIC, OR THROAT SWELLING 05/30/20  Yes [provider]  losartan (COZAAR) 100 MG tablet Take 100 mg by mouth daily. 04/18/22  Yes [provider]  OZEMPIC, 0.25 OR 0.5 MG/DOSE, 2 MG/3ML SOPN Inject 0.5 mg into the skin once a week. 05/25/22  Yes [provider]  pramipexole (MIRAPEX) 0.125 MG tablet TAKE 1 TABLET BY MOUTH 2 HOURS BEFORE BEDTIME, MAY TITRATE BY 1 TABLET PER WEEK AS NEEDED MAXIMUM 4 TABLETS. 07/31/22  Yes [provider]  rosuvastatin (CRESTOR) 5 MG tablet Take 5 mg by mouth daily. 07/27/22  Yes [provider]  spironolactone (ALDACTONE) 25 MG tablet Take 0.5 tablets (12.5 mg total) by mouth daily. Patient taking differently: Take 25 mg by mouth 2 (two) times daily. 06/07/20  Yes Fritzi Mandes, MD  albuterol (PROVENTIL HFA;VENTOLIN HFA) 108 (90 BASE) MCG/ACT  inhaler INHALE 2 PUFFS EVERY 4 - 6 HOURS AS NEEDED 06/07/14   [provider]  amiodarone (PACERONE) 200 MG tablet Take 1 tablet (200 mg total) by mouth 2 (two) times daily. 06/06/20   Fritzi Mandes, MD  apixaban (ELIQUIS) 5 MG TABS tablet Take 1 tablet (5 mg total) by mouth 2 (two) times daily. 06/06/20   Fritzi Mandes, MD  atorvastatin (LIPITOR) 80 MG tablet Take 1 tablet (80 mg total) by mouth daily. 06/07/20   Fritzi Mandes, MD  FARXIGA 10 MG TABS tablet Take 10 mg by mouth daily. 05/20/20   [provider]  fenofibrate 160 MG tablet Take 160 mg by mouth daily. 06/16/14   [provider]  furosemide (LASIX) 20 MG tablet Take 20 mg by mouth daily as needed. 12/15/18   [provider]  glipiZIDE (GLUCOTROL XL) 5 MG 24 hr tablet Take 10 mg by mouth daily. 2 tablets daily 09/17/14   [provider]  PARoxetine (PAXIL) 20 MG tablet Take 20 mg by mouth daily. 12/07/13   [provider]  rOPINIRole (REQUIP) 0.5 MG tablet Take 3 tablets by mouth at bedtime. 09/10/14   [provider]  sacubitril-valsartan (ENTRESTO) 24-26 MG Take 1 tablet by mouth 2 (two) times daily. 06/06/20   Fritzi Mandes, MD  SOLIQUA 100-33 UNT-MCG/ML SOPN Inject 60 Units into the skin in the morning. 11/11/18   [provider]  tiotropium (SPIRIVA) 18 MCG inhalation capsule Place 18 mcg into inhaler and inhale daily as needed. 03/23/16   [provider]    Physical Exam: Vitals:   08/06/22 1020 08/06/22 1300  BP: 132/69 (!) 138/59  Pulse: (!) 59 100  Resp: 16   Temp: 98.4 F (36.9 C)   TempSrc: Oral   SpO2: 96% 96%  Weight: 109.3 kg   Height: 6\' 2"  (1.88 m)    Physical Exam Constitutional:      General: He is not in acute distress.    Appearance: He is obese.  HENT:     Head: Normocephalic and atraumatic.     Mouth/Throat:     Mouth: Mucous membranes are moist.  Eyes:     Pupils: Pupils are equal, round, and reactive to light.  Cardiovascular:     Rate  and Rhythm: Normal rate and regular rhythm.  Pulmonary:     Effort: Pulmonary effort is normal.  Abdominal:     General: Bowel sounds are normal.  Musculoskeletal:     Cervical back: Normal range of motion.     Comments: Decreased range of motion in left great toe  Skin:    Comments: See pic  Neurological:     General: No focal deficit present.  Psychiatric:        Mood and Affect: Mood normal.       Data Reviewed:  There are no new results to review at this time. DG Foot Complete Left CLINICAL DATA:  Great toe left foot red and swollen.  Diabetic  EXAM: LEFT FOOT - COMPLETE 3 VIEW  COMPARISON:  None Available.  FINDINGS: There is a comminuted fracture involving the distal aspect of the distal phalanx of the great toe with displaced fragments. No additional fracture or dislocation. Associated soft tissue swelling. Small osteophytes along the first metatarsophalangeal joint. Plantar and Achilles calcaneal spurs. Vascular calcifications are seen.  IMPRESSION: Comminuted fracture of the distal phalanx of the great toe. No secondary erosive changes. Soft tissue swelling. If there is further concern infection additional workup as clinically directed. Please correlate for any history of trauma  Electronically Signed   By: Jill Side M.D.   On: 08/06/2022 10:56  Lab Results  Component Value Date   WBC 9.3 08/06/2022   HGB 15.0 08/06/2022   HCT 44.3 08/06/2022   MCV 85.2 08/06/2022   PLT 242 [id number]   Last metabolic panel Lab Results  Component Value Date   GLUCOSE 150 (H) 08/06/2022   NA 135 08/06/2022   K 3.8 08/06/2022   CL 100 08/06/2022   CO2 23 08/06/2022   BUN 29 (H) 08/06/2022   CREATININE 1.26 (H) 08/06/2022   GFRNONAA 59 (L) 08/06/2022   CALCIUM 9.2 08/06/2022   PHOS 2.7 06/06/2020   PROT 7.6 02/25/2021   ALBUMIN 3.4 (L) 02/25/2021   BILITOT 0.5 02/25/2021   ALKPHOS 84 02/25/2021   AST 17 02/25/2021   ALT 22 02/25/2021   ANIONGAP 12  08/06/2022    Assessment and Plan: * Diabetic foot infection Worsening left great toe redness swelling and drainage after toenail removal but patient x 1 week No fevers or chills.?  Purulent drainage by the patient White count 9.3, sed rate 19 Plain films with?  Comminuted fracture of great toe and soft tissue swelling Pending podiatry evaluation Blood sugars in the 150s on presentation Will place on Rocephin, Flagyl and vancomycin for infectious coverage for now Pending MRI of the foot per podiatry orders Will follow-up on formal podiatry recommendations Pain control Follow  Atrial fibrillation, chronic Stable  Cont amiodarone  Hold eliquis for now pending podiatry eval  Follow    CKD (chronic kidney disease), stage II Baseline Cr 09.4-1.3  Cr 1.26 today  GFR in the 50s  Follow    Hypertension BP stable  Titrate home regimen    Hyperlipidemia, unspecified Cont statin    Diabetes mellitus SSI A1c  COPD (chronic obstructive pulmonary disease) Stable from respiratory standpoint Continue home regimen      Advance Care Planning:   Code Status: Full Code   Consults: Pending eval by podiatry w/ Dr. Sherryle Lis   Family Communication: No family at the bedside   Severity of Illness: The appropriate patient status for this patient is INPATIENT. Inpatient status is judged to be reasonable and necessary in order to provide the required intensity of service to ensure the patient's safety. The patient's presenting symptoms, physical exam findings, and initial radiographic and laboratory data in the context of their chronic comorbidities is felt to place them at high risk for further clinical deterioration. Furthermore, it is not anticipated that the patient will be medically stable for discharge from the hospital within 2 midnights of admission.   * I certify that at the point of admission it is my clinical judgment that the patient will require inpatient hospital care  spanning beyond 2 midnights from the point of admission due to high intensity of service, high risk for further deterioration and high frequency of  surveillance required.*  Author: Deneise Lever, MD 08/06/2022 1:21 PM  For on call review [email or web address].

## 2022-08-06 NOTE — Consult Note (Signed)
Reason for Consult:*** Referring Physician: ***  Paul Goodman is an 77 y.o. male.  HPI: ***  Past Medical History:  Diagnosis Date   Anxiety    Arthritis    CHF (congestive heart failure)    COPD (chronic obstructive pulmonary disease)    Diabetes    ED (erectile dysfunction)    Heart murmur    Hyperlipidemia    Hypertension    Melanoma    Sleep apnea     Past Surgical History:  Procedure Laterality Date   EYE SURGERY Right    cancer   HAND SURGERY Left 1966   KNEE SURGERY Left 1973   TONSILLECTOMY  1964    Family History  Problem Relation Age of Onset   Kidney Stones Father    Kidney disease Neg Hx    Prostate cancer Neg Hx    Kidney cancer Neg Hx    Bladder Cancer Neg Hx     Social History:  reports that he has quit smoking. His smoking use included cigarettes. He has quit using smokeless tobacco. He reports that he does not currently use alcohol after a past usage of about 14.0 standard drinks of alcohol per week. He reports current drug use. Drug: Marijuana.  Allergies: No Known Allergies  Medications: {medication reviewed/display:3041432}  Results for orders placed or performed during the hospital encounter of 08/06/22 (from the past 48 hour(s))  CBC with Differential     Status: None   Collection Time: 08/06/22 10:58 AM  Result Value Ref Range   WBC 9.3 4.0 - 10.5 K/uL   RBC 5.20 4.22 - 5.81 MIL/uL   Hemoglobin 15.0 13.0 - 17.0 g/dL   HCT 44.3 39.0 - 52.0 %   MCV 85.2 80.0 - 100.0 fL   MCH 28.8 26.0 - 34.0 pg   MCHC 33.9 30.0 - 36.0 g/dL   RDW 12.9 11.5 - 15.5 %   Platelets 242 150 - 400 K/uL   nRBC 0.0 0.0 - 0.2 %   Neutrophils Relative % 70 %   Neutro Abs 6.5 1.7 - 7.7 K/uL   Lymphocytes Relative 21 %   Lymphs Abs 2.0 0.7 - 4.0 K/uL   Monocytes Relative 8 %   Monocytes Absolute 0.8 0.1 - 1.0 K/uL   Eosinophils Relative 1 %   Eosinophils Absolute 0.1 0.0 - 0.5 K/uL   Basophils Relative 0 %   Basophils Absolute 0.0 0.0 - 0.1 K/uL    Immature Granulocytes 0 %   Abs Immature Granulocytes 0.03 0.00 - 0.07 K/uL    Comment: Performed at [facility], [address] [place], [place] [id number]  Basic metabolic panel     Status: Abnormal   Collection Time: 08/06/22 10:58 AM  Result Value Ref Range   Sodium 135 135 - 145 mmol/L   Potassium 3.8 3.5 - 5.1 mmol/L   Chloride 100 98 - 111 mmol/L   CO2 23 22 - 32 mmol/L   Glucose, Bld 150 (H) 70 - 99 mg/dL    Comment: Glucose reference range applies only to samples taken after fasting for at least 8 hours.   BUN 29 (H) 8 - 23 mg/dL   Creatinine, Ser 1.26 (H) 0.61 - 1.24 mg/dL   Calcium 9.2 8.9 - 10.3 mg/dL   GFR, Estimated 59 (L) >60 mL/min    Comment: (NOTE) Calculated using the CKD-EPI Creatinine Equation (2021)    Anion gap 12 5 - 15    Comment: Performed at [facility],  [place], [place] [address]  Sedimentation rate     Status: None   Collection Time: 08/06/22 10:58 AM  Result Value Ref Range   Sed Rate 19 0 - 20 mm/hr    Comment: Performed at [facility], [address] [place], [place] [address]  Uric acid     Status: None   Collection Time: 08/06/22 10:58 AM  Result Value Ref Range   Uric Acid, Serum 5.3 3.7 - 8.6 mg/dL    Comment: HEMOLYSIS AT THIS LEVEL MAY AFFECT RESULT Performed at [facility], [address] [place], [place] [address]     MR TOES LEFT WO CONTRAST  Result Date: 08/06/2022 CLINICAL DATA:  Foot swelling, diabetic, osteomyelitis suspected, xray done EXAM: MRI OF THE LEFT TOES WITHOUT CONTRAST TECHNIQUE: Multiplanar, multisequence MR imaging of the left toes (great toe) was performed. No intravenous contrast was administered. COMPARISON:  Left foot radiograph 08/06/2022 FINDINGS: Bones/Joint/Cartilage There is a comminuted fracture of the distal phalanx there is associated marrow edema. T1 signal is relatively preserved. Mild reactive marrow edema in the second metatarsal head. Ligaments  Intact Lisfranc ligament. Muscles and Tendons Intramuscular edema and atrophy in the foot, compatible with chronic denervation change. Soft tissues There is skin thickening and soft tissue swelling of the great toe. There is dorsal foot soft tissue swelling. No organized fluid collection. IMPRESSION: Comminuted fracture of the distal phalanx of the great toe. Associated marrow edema with preserved T1 marrow signal which could reflect reactive marrow change or early osteomyelitis. Skin thickening and soft tissue swelling of the great toe, as can be seen in cellulitis. Additional dorsal soft tissue swelling of the foot. No evidence of an organized/drainable fluid collection. Electronically Signed   By: Maurine Simmering M.D.   On: 08/06/2022 14:21   DG Foot Complete Left  Result Date: 08/06/2022 CLINICAL DATA:  Great toe left foot red and swollen.  Diabetic EXAM: LEFT FOOT - COMPLETE 3 VIEW COMPARISON:  None Available. FINDINGS: There is a comminuted fracture involving the distal aspect of the distal phalanx of the great toe with displaced fragments. No additional fracture or dislocation. Associated soft tissue swelling. Small osteophytes along the first metatarsophalangeal joint. Plantar and Achilles calcaneal spurs. Vascular calcifications are seen. IMPRESSION: Comminuted fracture of the distal phalanx of the great toe. No secondary erosive changes. Soft tissue swelling. If there is further concern infection additional workup as clinically directed. Please correlate for any history of trauma Electronically Signed   By: Jill Side M.D.   On: 08/06/2022 10:56    ROS Blood pressure 130/73, pulse 71, temperature 98.4 F (36.9 C), resp. rate 16, height 6\' 2"  (1.88 m), weight 109.3 kg, SpO2 98 %.  Vitals:   08/06/22 1300 08/06/22 1614  BP: (!) 138/59 130/73  Pulse: 100 71  Resp:  16  Temp:  98.4 F (36.9 C)  SpO2: 96% 98%    General AA&O x3. Normal mood and affect.  Vascular Dorsalis pedis and posterior  tibial pulses  {:23035} {Left/right:33004}  Capillary refill {pe heart capillary fill:310346} to all digits. Pedal hair growth {Desc; normal/diminished:12763}.  Neurologic Epicritic sensation grossly {DESC; ABSENT OR PRESENT:19119}.  Dermatologic (Wound) Wound Location: {:11009} Wound Measurement: *** Wound Base: {findings; base ulcer:11056} Peri-wound: {Peri-wound:10864} Exudate: {:10862}  Orthopedic: Motor intact BLE.    Assessment/Plan:  *** -Imaging: Studies independently reviewed -Antibiotics: *** -WB Status: *** -Wound Care: *** -Surgical Plan: ***  Criselda Peaches 08/06/2022, 5:10 PM   Best available  via secure chat for questions or concerns.

## 2022-08-07 DIAGNOSIS — L03032 Cellulitis of left toe: Principal | ICD-10-CM

## 2022-08-07 DIAGNOSIS — S92422B Displaced fracture of distal phalanx of left great toe, initial encounter for open fracture: Secondary | ICD-10-CM

## 2022-08-07 DIAGNOSIS — I482 Chronic atrial fibrillation, unspecified: Secondary | ICD-10-CM | POA: Diagnosis not present

## 2022-08-07 DIAGNOSIS — N182 Chronic kidney disease, stage 2 (mild): Secondary | ICD-10-CM

## 2022-08-07 DIAGNOSIS — E11628 Type 2 diabetes mellitus with other skin complications: Secondary | ICD-10-CM | POA: Diagnosis not present

## 2022-08-07 LAB — CBC
HCT: 39.6 % (ref 39.0–52.0)
Hemoglobin: 13.6 g/dL (ref 13.0–17.0)
MCH: 28.8 pg (ref 26.0–34.0)
MCHC: 34.3 g/dL (ref 30.0–36.0)
MCV: 83.9 fL (ref 80.0–100.0)
Platelets: 203 10*3/uL (ref 150–400)
RBC: 4.72 MIL/uL (ref 4.22–5.81)
RDW: 12.7 % (ref 11.5–15.5)
WBC: 7.4 10*3/uL (ref 4.0–10.5)
nRBC: 0 % (ref 0.0–0.2)

## 2022-08-07 LAB — COMPREHENSIVE METABOLIC PANEL
ALT: 11 U/L (ref 0–44)
AST: 14 U/L — ABNORMAL LOW (ref 15–41)
Albumin: 3.3 g/dL — ABNORMAL LOW (ref 3.5–5.0)
Alkaline Phosphatase: 66 U/L (ref 38–126)
Anion gap: 7 (ref 5–15)
BUN: 27 mg/dL — ABNORMAL HIGH (ref 8–23)
CO2: 23 mmol/L (ref 22–32)
Calcium: 8.6 mg/dL — ABNORMAL LOW (ref 8.9–10.3)
Chloride: 104 mmol/L (ref 98–111)
Creatinine, Ser: 1.1 mg/dL (ref 0.61–1.24)
GFR, Estimated: >60 mL/min (ref >60)
Glucose, Bld: 158 mg/dL — ABNORMAL HIGH (ref 70–99)
Potassium: 3.8 mmol/L (ref 3.5–5.1)
Sodium: 134 mmol/L — ABNORMAL LOW (ref 135–145)
Total Bilirubin: 0.6 mg/dL (ref 0.3–1.2)
Total Protein: 6.3 g/dL — ABNORMAL LOW (ref 6.5–8.1)

## 2022-08-07 LAB — GLUCOSE, CAPILLARY
Glucose-Capillary: 194 mg/dL — ABNORMAL HIGH (ref 70–99)
Glucose-Capillary: 153 mg/dL — ABNORMAL HIGH (ref 70–99)
Glucose-Capillary: 135 mg/dL — ABNORMAL HIGH (ref 70–99)
Glucose-Capillary: 143 mg/dL — ABNORMAL HIGH (ref 70–99)
Glucose-Capillary: 151 mg/dL — ABNORMAL HIGH (ref 70–99)

## 2022-08-07 MED ORDER — VANCOMYCIN HCL 2000 MG/400ML IV SOLN
2000.0000 mg | Freq: Once | INTRAVENOUS | Status: AC
  Administered 2022-08-07: 2000 mg via INTRAVENOUS
  Filled 2022-08-07: qty 400

## 2022-08-07 MED ORDER — VITAMIN C 500 MG PO TABS
500.0000 mg | ORAL_TABLET | Freq: Every day | ORAL | Status: DC
  Administered 2022-08-07 – 2022-08-08 (×2): 500 mg via ORAL
  Filled 2022-08-07 (×2): qty 1

## 2022-08-07 MED ORDER — VANCOMYCIN HCL 1250 MG/250ML IV SOLN
1250.0000 mg | Freq: Two times a day (BID) | INTRAVENOUS | Status: DC
  Administered 2022-08-08 (×2): 1250 mg via INTRAVENOUS
  Filled 2022-08-07 (×2): qty 250

## 2022-08-07 MED ORDER — ZINC SULFATE 220 (50 ZN) MG PO CAPS
220.0000 mg | ORAL_CAPSULE | Freq: Every day | ORAL | Status: DC
  Administered 2022-08-07 – 2022-08-08 (×2): 220 mg via ORAL
  Filled 2022-08-07 (×2): qty 1

## 2022-08-07 MED ORDER — JUVEN PO PACK
1.0000 | PACK | Freq: Two times a day (BID) | ORAL | Status: DC
  Administered 2022-08-07 – 2022-08-08 (×3): 1 via ORAL

## 2022-08-07 NOTE — TOC CM/SW Note (Signed)
CSW acknowledges consult for home health/DME needs and medication assistance. Please consult PT and OT if patient needs to be evaluated for home health/DME needs. Patient has insurance so CSW unable to provide medication assistance.  Dayton Scrape, [place]

## 2022-08-07 NOTE — Plan of Care (Signed)

## 2022-08-07 NOTE — Consult Note (Signed)
Pharmacy Antibiotic Note  Paul Goodman is a 77 y.o. male admitted on 08/06/2022 with diabetic foot infection.  Pharmacy has been consulted for vancomycin dosing.  Vancomycin 1000 mg IV x 1 given 4/1 @ 1412.  Additional 1250 mg ordered for loading dose not given.  Plan: Give Vancomycin 2000 mg IV x 1, then start 1250 mg IV every 12 hours. Goal AUC 400-550 Estimated AUC 526, Cmin 16.3 Wt 109.3 kg, Scr 1.1, Vd coefficient 0.72  Vancomycin levels at steady state of as clinically indicated Ceftriaxone 2 grams VI every 24 hours ordered by provider Flagyl 500 mg IV every 12 hours ordered by provider Follow renal function and cultures for adjustments  Height: 6\' 2"  (188 cm) Weight: 109.3 kg (241 lb) IBW/kg (Calculated) : 82.2  Temp (24hrs), Avg:98.1 F (36.7 C), Min:97.6 F (36.4 C), Max:98.4 F (36.9 C)  Recent Labs  Lab 08/06/22 1058 08/07/22 0616  WBC 9.3 7.4  CREATININE 1.26* 1.10    Estimated Creatinine Clearance: 76.3 mL/min (by C-G formula based on SCr of 1.1 mg/dL).    No Known Allergies  Antimicrobials this admission: Vancomycin 4/1 >>  ceftriaxone 4/1 >>  Flagyl 4/1>>  Dose adjustments this admission: N/A  Microbiology results: 4/1 BCx: ngtd 4/1 Wound: pending 4/1 MRSA PCR: positive  Thank you for allowing pharmacy to be a part of this patient's care.  Lorin Picket, PharmD 08/07/2022 11:00 AM

## 2022-08-07 NOTE — Progress Notes (Signed)
PODIATRY PROGRESS NOTE  NAME Paul Goodman MRN [id number] DOB August 17, 1945 DOA 08/06/2022   Chief Complaint  Patient presents with   Wound Check   History of present illness: 77 y.o. male follow-up evaluation of cellulitis with possible osteomyelitis to the distal tip of the left great toe.  Patient states that he feels well.  He believes there is improvement over the last 24 hours.  No new complaints at this time  Past Medical History:  Diagnosis Date   Anxiety    Arthritis    CHF (congestive heart failure)    COPD (chronic obstructive pulmonary disease)    Diabetes    ED (erectile dysfunction)    Heart murmur    Hyperlipidemia    Hypertension    Melanoma    Sleep apnea        Latest Ref Rng & Units 08/07/2022    6:16 AM 08/06/2022   10:58 AM 03/15/2022    2:56 PM  CBC  WBC 4.0 - 10.5 K/uL 7.4  9.3  7.5   Hemoglobin 13.0 - 17.0 g/dL 13.6  15.0  14.4   Hematocrit 39.0 - 52.0 % 39.6  44.3  42.1   Platelets 150 - 400 K/uL 203  242  218        Latest Ref Rng & Units 08/07/2022    6:16 AM 08/06/2022   10:58 AM 03/15/2022    2:56 PM  BMP  Glucose 70 - 99 mg/dL 158  150  139   BUN 8 - 23 mg/dL 27  29  20    Creatinine 0.61 - 1.24 mg/dL 1.10  1.26  1.34   Sodium 135 - 145 mmol/L 134  135  135   Potassium 3.5 - 5.1 mmol/L 3.8  3.8  4.2   Chloride 98 - 111 mmol/L 104  100  105   CO2 22 - 32 mmol/L 23  23  23    Calcium 8.9 - 10.3 mg/dL 8.6  9.2  9.1       LT great toe 08/07/2022    Intermixed fibrotic and granular tissue noted with serous drainage left great toe.  There continues to be some erythema and edema around the toe.  No malodor.  No purulence.  MR TOES LEFT WO CONTRAST 08/06/2022 IMPRESSION: Comminuted fracture of the distal phalanx of the great toe. Associated marrow edema with preserved T1 marrow signal which could reflect reactive marrow change or early osteomyelitis. Skin thickening and soft tissue swelling of the great toe, as can be seen in cellulitis.  Additional dorsal soft tissue swelling of the foot. No evidence of an organized/drainable fluid collection.  ASSESSMENT/PLAN OF CARE Osteomyelitis vs. acute comminuted fracture distal phalanx left great toe  -Patient would like to pursue conservative treatment for now.  He would like to avoid surgical amputation of the toe if possible.  At the moment based on MRI findings and clinical exam I do not see any emergent need for amputation. -Pursue conservative antibiotic treatment for now.  Recommend IV antibiotics for the next 24-48 hours.  Reassess improvement of the toe.  If there is improvement okay for discharge on oral antibiotics -Continue daily dressing changes -Follow-up outpatient in office 1 week post discharge.  Patient understands that if there is no improvement we will likely need to arrange and set up outpatient amputation and he is amenable to this plan however he would like to pursue conservative antibiotic treatment for now     Edrick Kins, DPM [facility]  Dr. Edrick Kins, DPM    [address] [place], [place] [address]                Office [phone]  Fax [phone]

## 2022-08-07 NOTE — Progress Notes (Signed)
Initial Nutrition Assessment  DOCUMENTATION CODES:   Obesity unspecified  INTERVENTION:  - Add Vit C daily.   - Add Zinc x14 days.   - Add -1 packet Juven BID, each packet provides 95 calories, 2.5 grams of protein (collagen), and 9.8 grams of carbohydrate (3 grams sugar); also contains 7 grams of L-arginine and L-glutamine, 300 mg vitamin C, 15 mg vitamin E, 1.2 mcg vitamin B-12, 9.5 mg zinc, 200 mg calcium, and 1.5 g  Calcium Beta-hydroxy-Beta-methylbutyrate to support wound healing  NUTRITION DIAGNOSIS:   Increased nutrient needs related to wound healing as evidenced by estimated needs.  GOAL:   Patient will meet greater than or equal to 90% of their needs  MONITOR:   PO intake, Skin  REASON FOR ASSESSMENT:   Consult Wound healing  ASSESSMENT:   77 y.o. male admits related to wound healing. PMH includes: T2DM, CHF, COPD, HLD, HTN. Pt is currently receiving medical management related to diabetic foot infection.  Meds reviewed: sliding scale insulin, 4 units Novolog TID, Semglee (30 units). Labs reviewed: Na low, BUN elevated, Ca low.  IVF: NS @ 50 mL/hr. Blood sugars well controlled.   The pt reports that he has a great appetite. He states that he was eating well PTA and denies any wt changes. Pt states that he is very familiar with CHO modified diet and was following it closely at home. RD will add Juven as well as Vit C and zinc. RD will continue to monitor PO intakes.   NUTRITION - FOCUSED PHYSICAL EXAM:  Flowsheet Row Most Recent Value  Orbital Region No depletion  Upper Arm Region No depletion  Thoracic and Lumbar Region No depletion  Buccal Region No depletion  Temple Region No depletion  Clavicle Bone Region No depletion  Clavicle and Acromion Bone Region No depletion  Scapular Bone Region No depletion  Dorsal Hand No depletion  Patellar Region No depletion  Anterior Thigh Region No depletion  Posterior Calf Region No depletion  Edema (RD Assessment)  Moderate  Hair Reviewed  Eyes Reviewed  Mouth Reviewed  Skin Reviewed  Nails Reviewed       Diet Order:   Diet Order             Diet Carb Modified Fluid consistency: Thin; Room service appropriate? Yes  Diet effective now                   EDUCATION NEEDS:   Not appropriate for education at this time  Skin:  Skin Assessment: Reviewed RN Assessment  Last BM:  4/1  Height:   Ht Readings from Last 1 Encounters:  08/06/22 6\' 2"  (1.88 m)    Weight:   Wt Readings from Last 1 Encounters:  08/06/22 109.3 kg    Ideal Body Weight:     BMI:  Body mass index is 30.94 kg/m.  Estimated Nutritional Needs:   Kcal:  2185-2730 kcals  Protein:  100-135 gm  Fluid:  >/= 2.1 L   Thalia Bloodgood, RD, LDN, CNSC.

## 2022-08-07 NOTE — Progress Notes (Signed)
Progress Note   Patient: Paul Goodman [id number] DOB: Feb 27, 1946 DOA: 08/06/2022     1 DOS: the patient was seen and examined on 08/07/2022   Brief hospital course:  77 y.o. male with medical history significant of type 2 diabetes, CHF, COPD, hyperlipidemia, hypertension presenting with diabetic foot infection.  Patient reports having recurrent issues with the great toe with nail sometimes, and often associated redness.  Patient reports that he had some manipulation of the toe nail around 1 week ago when the toenail spontaneously came off.  Has had some significant redness swelling with toenail regrowth per patient.  No fevers or chills.?  Purulent drainage.  No nausea or vomiting.  Redness and swelling has progressively worsened.  Blood sugars were well-controlled at home for vision.  Blood sugars usually in the 150s.  Non-smoker.  Patient denies any alcohol use.Presented to the ER afebrile, hemodynamically stable.  White count 9.3, hemoglobin 15, creatinine 1.26, glucose 150, sed rate within normal limits.  Plain films of the great toe with concern for comminuted fracture and soft tissue swelling.  2/4 : MRI showed marrow edema T1 marrow signal which could reflect reactive marrow change or early osteomyelitis. Skin thickening and soft tissue swelling of the great toe, as can be seen in cellulitis.  Podiatry: The patient recommends with the same antibiotics pending culture from serous drainage from the bone.  Wound care with gentamicin ointment.  Assessment and Plan: Diabetic foot infection : MRI showed Comminuted fracture of the distal phalanx of the great toe. Associated marrow edema with preserved T1 marrow signal which could reflect reactive marrow change or early osteomyelitis. Skin thickening and soft tissue swelling of the great toe, as can be seen in cellulitis. Additional dorsal soft tissue swelling of the foot. No evidence of an organized/drainable fluid collection.  Worsening left  great toe redness swelling and drainage after toenail removal but patient x 1 week No fevers or chills.?  Purulent drainage by the patient White count 9.3, sed rate 19 Plain films with?  Comminuted fracture of great toe and soft tissue swelling Pending podiatry evaluation Blood sugars in the 150s on presentation Will place on Rocephin, Flagyl and vancomycin for infectious coverage for now Pain control Follow  Atrial fibrillation, chronic Stable  Cont amiodarone  Hold eliquis for now pending podiatry eval   CKD (chronic kidney disease), stage II Baseline Cr 09.4-1.3  Cr 1.26 today  GFR in the 50s   Hypertension BP stable  Titrate home regimen   Hyperlipidemia, unspecified Cont statin   Diabetes mellitus SSI A1c  COPD (chronic obstructive pulmonary disease) Stable from respiratory standpoint Continue home regimen     Subjective: Patient seen and examined this morning.  No overnight events.  Concerned about salvaging his big toe.  Podiatry recommendation pending after MRI.  Will continue with the same antibiotic in the meantime.  Physical Exam: Vitals:   08/06/22 1300 08/06/22 1614 08/06/22 2333 08/07/22 0745  BP: (!) 138/59 130/73 (!) 100/57 120/82  Pulse: 100 71 100 87  Resp:  16 20 19   Temp:  98.4 F (36.9 C) 98.2 F (36.8 C) 97.6 F (36.4 C)  TempSrc:   Oral Oral  SpO2: 96% 98% 99% 100%  Weight:      Height:       Physical Exam Constitutional:      Appearance: Normal appearance.  HENT:     Nose: Nose normal.  Eyes:     Extraocular Movements: Extraocular movements intact.  Pupils: Pupils are equal, round, and reactive to light.  Cardiovascular:     Rate and Rhythm: Normal rate and regular rhythm.  Pulmonary:     Effort: Pulmonary effort is normal.  Abdominal:     General: Abdomen is flat.  Musculoskeletal:        General: Normal range of motion.     Cervical back: Normal range of motion.     Comments: toe with cellulitis and erythema extending  to the base of the toe.  Skin:    General: Skin is warm.  Neurological:     General: No focal deficit present.     Mental Status: He is alert and oriented to person, place, and time.  Psychiatric:        Mood and Affect: Mood normal.        Behavior: Behavior normal.     Data Reviewed:  There are no new results to review at this time.  Family Communication:   Disposition: Status is: Inpatient Remains inpatient appropriate because: OM   Planned Discharge Destination: Home    Time spent: 31 minutes  Author: Oran Rein, MD 08/07/2022 8:28 AM  For on call review [email or web address].

## 2022-08-08 DIAGNOSIS — E11628 Type 2 diabetes mellitus with other skin complications: Secondary | ICD-10-CM | POA: Diagnosis not present

## 2022-08-08 DIAGNOSIS — I482 Chronic atrial fibrillation, unspecified: Secondary | ICD-10-CM | POA: Diagnosis not present

## 2022-08-08 DIAGNOSIS — L03032 Cellulitis of left toe: Secondary | ICD-10-CM | POA: Diagnosis not present

## 2022-08-08 DIAGNOSIS — J449 Chronic obstructive pulmonary disease, unspecified: Secondary | ICD-10-CM | POA: Diagnosis not present

## 2022-08-08 LAB — CREATININE, SERUM
Creatinine, Ser: 1.01 mg/dL (ref 0.61–1.24)
GFR, Estimated: >60 mL/min (ref >60)

## 2022-08-08 LAB — GLUCOSE, CAPILLARY
Glucose-Capillary: 140 mg/dL — ABNORMAL HIGH (ref 70–99)
Glucose-Capillary: 215 mg/dL — ABNORMAL HIGH (ref 70–99)

## 2022-08-08 LAB — HEMOGLOBIN A1C
Hgb A1c MFr Bld: 7.7 % — ABNORMAL HIGH (ref 4.8–5.6)
Mean Plasma Glucose: 174 mg/dL

## 2022-08-08 MED ORDER — AMOXICILLIN-POT CLAVULANATE 875-125 MG PO TABS: 1.0000 | ORAL_TABLET | Freq: Two times a day (BID) | ORAL | Status: DC

## 2022-08-08 MED ORDER — GENTAMICIN SULFATE 0.1 % EX OINT: TOPICAL_OINTMENT | Freq: Every day | CUTANEOUS | 0 refills | Status: DC

## 2022-08-08 MED ORDER — DOXYCYCLINE HYCLATE 100 MG PO TABS: 100.0000 mg | ORAL_TABLET | Freq: Two times a day (BID) | ORAL | Status: DC

## 2022-08-08 MED ORDER — DOXYCYCLINE HYCLATE 100 MG PO TABS: 100.0000 mg | ORAL_TABLET | Freq: Two times a day (BID) | ORAL | 0 refills | Status: DC

## 2022-08-08 MED ORDER — AMOXICILLIN-POT CLAVULANATE 875-125 MG PO TABS: 1.0000 | ORAL_TABLET | Freq: Two times a day (BID) | ORAL | 0 refills | Status: DC

## 2022-08-08 NOTE — Progress Notes (Signed)
Reviewed discharge instructions with pt. Pt verbalized understanding. Post op boot for left foot on. Changed dressing before pt discharging. Staff wheeled pt out. Pt transported to home via family vehicle.

## 2022-08-08 NOTE — Plan of Care (Signed)
Problem: Education: Goal: Ability to describe self-care measures that may prevent or decrease complications (Diabetes Survival Skills Education) will improve 08/08/2022 1508 by Zyionna Pesce Bet, LPN Outcome: Adequate for Discharge 08/08/2022 1138 by Lavonn Maxcy Bet, LPN Outcome: Progressing Goal: Individualized Educational Video(s) 08/08/2022 1508 by Jasper Loser, Sibyl Mikula Bet, LPN Outcome: Adequate for Discharge 08/08/2022 1138 by Diora Bellizzi Bet, LPN Outcome: Progressing   Problem: Coping: Goal: Ability to adjust to condition or change in health will improve 08/08/2022 1508 by Baltasar Twilley Bet, LPN Outcome: Adequate for Discharge 08/08/2022 1138 by Stevee Valenta Bet, LPN Outcome: Progressing   Problem: Fluid Volume: Goal: Ability to maintain a balanced intake and output will improve 08/08/2022 1508 by Matisyn Cabeza Bet, LPN Outcome: Adequate for Discharge 08/08/2022 1138 by Diamond Jentz Bet, LPN Outcome: Progressing   Problem: Health Behavior/Discharge Planning: Goal: Ability to identify and utilize available resources and services will improve 08/08/2022 1508 by Leather Estis Bet, LPN Outcome: Adequate for Discharge 08/08/2022 1138 by Hart Haas Bet, LPN Outcome: Progressing Goal: Ability to manage health-related needs will improve 08/08/2022 1508 by Pinchos Topel Bet, LPN Outcome: Adequate for Discharge 08/08/2022 1138 by Lada Fulbright Bet, LPN Outcome: Progressing   Problem: Metabolic: Goal: Ability to maintain appropriate glucose levels will improve 08/08/2022 1508 by Shneur Whittenburg Bet, LPN Outcome: Adequate for Discharge 08/08/2022 1138 by Marcel Sorter Bet, LPN Outcome: Progressing   Problem: Nutritional: Goal: Maintenance of adequate nutrition will improve 08/08/2022 1508 by Safa Derner Bet, LPN Outcome: Adequate for Discharge 08/08/2022 1138 by Ares Cardozo Bet, LPN Outcome: Progressing Goal: Progress toward achieving an optimal weight will improve 08/08/2022 1508 by Jonan Seufert Bet, LPN Outcome: Adequate for Discharge 08/08/2022  1138 by Brylyn Novakovich Bet, LPN Outcome: Progressing   Problem: Skin Integrity: Goal: Risk for impaired skin integrity will decrease 08/08/2022 1508 by Simra Fiebig Bet, LPN Outcome: Adequate for Discharge 08/08/2022 1138 by Leiland Mihelich Bet, LPN Outcome: Progressing   Problem: Tissue Perfusion: Goal: Adequacy of tissue perfusion will improve 08/08/2022 1508 by Carla Rashad Bet, LPN Outcome: Adequate for Discharge 08/08/2022 1138 by Kamorah Nevils Bet, LPN Outcome: Progressing   Problem: Education: Goal: Knowledge of General Education information will improve Description: Including pain rating scale, medication(s)/side effects and non-pharmacologic comfort measures 08/08/2022 1508 by Conlee Sliter Bet, LPN Outcome: Adequate for Discharge 08/08/2022 1138 by Halbert Jesson Bet, LPN Outcome: Progressing   Problem: Health Behavior/Discharge Planning: Goal: Ability to manage health-related needs will improve 08/08/2022 1508 by Frances Joynt Bet, LPN Outcome: Adequate for Discharge 08/08/2022 1138 by Kase Shughart Bet, LPN Outcome: Progressing   Problem: Clinical Measurements: Goal: Ability to maintain clinical measurements within normal limits will improve 08/08/2022 1508 by Taneshia Lorence Bet, LPN Outcome: Adequate for Discharge 08/08/2022 1138 by Salaya Holtrop Bet, LPN Outcome: Progressing Goal: Will remain free from infection 08/08/2022 1508 by Tressie Ragin Bet, LPN Outcome: Adequate for Discharge 08/08/2022 1138 by Adalea Handler Bet, LPN Outcome: Progressing Goal: Diagnostic test results will improve 08/08/2022 1508 by Eryanna Regal Bet, LPN Outcome: Adequate for Discharge 08/08/2022 1138 by Alondra Sahni Bet, LPN Outcome: Progressing Goal: Respiratory complications will improve 08/08/2022 1508 by Bettyjo Lundblad Bet, LPN Outcome: Adequate for Discharge 08/08/2022 1138 by Malakye Nolden Bet, LPN Outcome: Progressing Goal: Cardiovascular complication will be avoided 08/08/2022 1508 by Shakala Marlatt Bet, LPN Outcome: Adequate for Discharge 08/08/2022 1138  by Jewels Langone Bet, LPN Outcome: Progressing   Problem: Activity: Goal: Risk for activity intolerance will decrease 08/08/2022 1508 by Riki Berninger Bet, LPN Outcome: Adequate for Discharge  08/08/2022 1138 by Kahil Agner Bet, LPN Outcome: Progressing   Problem: Nutrition: Goal: Adequate nutrition will be maintained 08/08/2022 1508 by Chaquetta Schlottman Bet, LPN Outcome: Adequate for Discharge 08/08/2022 1138 by Riyan Gavina Bet, LPN Outcome: Progressing   Problem: Coping: Goal: Level of anxiety will decrease 08/08/2022 1508 by Dusan Lipford Bet, LPN Outcome: Adequate for Discharge 08/08/2022 1138 by Cameka Rae Bet, LPN Outcome: Progressing   Problem: Elimination: Goal: Will not experience complications related to bowel motility 08/08/2022 1508 by Seville Brick Bet, LPN Outcome: Adequate for Discharge 08/08/2022 1138 by Addalynne Golding Bet, LPN Outcome: Progressing Goal: Will not experience complications related to urinary retention 08/08/2022 1508 by Kendry Pfarr Bet, LPN Outcome: Adequate for Discharge 08/08/2022 1138 by Annaleia Pence Bet, LPN Outcome: Progressing   Problem: Pain Managment: Goal: General experience of comfort will improve 08/08/2022 1508 by Taha Dimond Bet, LPN Outcome: Adequate for Discharge 08/08/2022 1138 by Desarie Feild Bet, LPN Outcome: Progressing   Problem: Safety: Goal: Ability to remain free from injury will improve 08/08/2022 1508 by Yumiko Alkins Bet, LPN Outcome: Adequate for Discharge 08/08/2022 1138 by Tariah Transue Bet, LPN Outcome: Progressing   Problem: Skin Integrity: Goal: Risk for impaired skin integrity will decrease 08/08/2022 1508 by Margurete Guaman Bet, LPN Outcome: Adequate for Discharge 08/08/2022 1138 by Moody Robben Bet, LPN Outcome: Progressing   Problem: Increased Nutrient Needs (NI-5.1) Goal: Food and/or nutrient delivery Description: Individualized approach for food/nutrient provision. Outcome: Adequate for Discharge

## 2022-08-08 NOTE — Discharge Summary (Signed)
Physician Discharge Summary   Patient: Paul Goodman MRN: [id number] DOB: [birth year]  Admit date:     08/06/2022  Discharge date: 08/08/22  Discharge Physician: Oran Rein   PCP: [name], MD   Recommendations at discharge:   Podiatry follow-up on 8 April. Follow-up with PCP in 2 weeks  Discharge Diagnoses: Principal Problem:   Diabetic foot infection Active Problems:   COPD (chronic obstructive pulmonary disease)   Diabetes mellitus   Hyperlipidemia, unspecified   Hypertension   CKD (chronic kidney disease), stage II   Atrial fibrillation, chronic   Cellulitis of great toe of left foot   Open displaced fracture of distal phalanx of left great toe  Resolved Problems:   * No resolved hospital problems. *  Hospital Course:  77 y.o. male with medical history significant of type 2 diabetes, CHF, COPD, hyperlipidemia, hypertension presenting with diabetic foot infection.  Patient reports having recurrent issues with the great toe with nail sometimes, and often associated redness.  Patient reports that he had some manipulation of the toe nail around 1 week ago when the toenail spontaneously came off.  Has had some significant redness swelling with toenail regrowth per patient.  No fevers or chills.?  Purulent drainage.  No nausea or vomiting.  Redness and swelling has progressively worsened.  Blood sugars were well-controlled at home for vision.  Blood sugars usually in the 150s.  Non-smoker.  Patient denies any alcohol use.Presented to the ER afebrile, hemodynamically stable.  White count 9.3, hemoglobin 15, creatinine 1.26, glucose 150, sed rate within normal limits.  Plain films of the great toe with concern for comminuted fracture and soft tissue swelling.   2/4 : MRI showed marrow edema T1 marrow signal which could reflect reactive marrow change or early osteomyelitis. Skin thickening and soft tissue swelling of the great toe, as can be seen in cellulitis.    Podiatry  saw the patient and recommended Augmentin and doxycycline for total 14 days postdischarge.  Follow-up with podiatry on coming Monday,13 August 2022.   Wound care with gentamicin ointment and daily dressing change.  Rest of the management was continued as per his comorbidities.  Advised to continue the same regimen at home.  On the day of discharge patient was medically stable with no active complaints.  Assessment and Plan: * Diabetic foot infection Worsening left great toe redness swelling and drainage after toenail removal but patient x 1 week No fevers or chills.?  Purulent drainage by the patient White count 9.3, sed rate 19 Plain films with?  Comminuted fracture of great toe and soft tissue swelling Pending podiatry evaluation Blood sugars in the 150s on presentation Will place on Rocephin, Flagyl and vancomycin for infectious coverage for now Pending MRI of the foot per podiatry orders Will follow-up on formal podiatry recommendations Pain control Follow  Atrial fibrillation, chronic Stable  Cont amiodarone  Hold eliquis for now pending podiatry eval  Follow   CKD (chronic kidney disease), stage II Baseline Cr 09.4-1.3  Cr 1.26 today  GFR in the 50s  Follow   Hypertension BP stable  Titrate home regimen   Hyperlipidemia, unspecified Cont statin   Diabetes mellitus SSI A1c  COPD (chronic obstructive pulmonary disease) Stable from respiratory standpoint Continue home regimen     Pain control - [employer] Controlled Substance Reporting System database was reviewed. and patient was instructed, not to drive, operate heavy machinery, perform activities at heights, swimming or participation in water activities or provide baby-sitting services while  on Pain, Sleep and Anxiety Medications; until their outpatient Physician has advised to do so again. Also recommended to not to take more than prescribed Pain, Sleep and Anxiety Medications.  Consultants: None   Procedures performed: None  Disposition: Home Diet recommendation:  Discharge Diet Orders (From admission, onward)     Start     Ordered   08/08/22 0000  Diet - low sodium heart healthy        08/08/22 1505           Cardiac diet DISCHARGE MEDICATION: Allergies as of 08/08/2022   No Known Allergies      Medication List     TAKE these medications    albuterol 108 (90 Base) MCG/ACT inhaler Commonly known as: VENTOLIN HFA INHALE 2 PUFFS EVERY 4 - 6 HOURS AS NEEDED   amiodarone 200 MG tablet Commonly known as: PACERONE Take 1 tablet (200 mg total) by mouth 2 (two) times daily.   amoxicillin-clavulanate 875-125 MG tablet Commonly known as: AUGMENTIN Take 1 tablet by mouth every 12 (twelve) hours.   apixaban 5 MG Tabs tablet Commonly known as: ELIQUIS Take 1 tablet (5 mg total) by mouth 2 (two) times daily.   atorvastatin 80 MG tablet Commonly known as: LIPITOR Take 1 tablet (80 mg total) by mouth daily.   Basaglar KwikPen 100 UNIT/ML SMARTSIG:60 Unit(s) SUB-Q Every Night   carvedilol 25 MG tablet Commonly known as: COREG Take 1 tablet (25 mg total) by mouth 2 (two) times daily with a meal.   doxycycline 100 MG tablet Commonly known as: VIBRA-TABS Take 1 tablet (100 mg total) by mouth every 12 (twelve) hours.   Farxiga 10 MG Tabs tablet Generic drug: dapagliflozin propanediol Take 10 mg by mouth daily.   fenofibrate 160 MG tablet Take 160 mg by mouth daily.   furosemide 20 MG tablet Commonly known as: LASIX Take 20 mg by mouth daily as needed.   gabapentin 100 MG capsule Commonly known as: NEURONTIN Take 100 mg by mouth 3 (three) times daily.   gentamicin ointment 0.1 % Commonly known as: GARAMYCIN Apply topically daily. Start taking on: August 09, 2022   glipiZIDE 5 MG 24 hr tablet Commonly known as: GLUCOTROL XL Take 10 mg by mouth daily. 2 tablets daily   hydrochlorothiazide 25 MG tablet Commonly known as: HYDRODIURIL Take 25 mg by mouth  daily.   LORazepam 1 MG tablet Commonly known as: ATIVAN Take 0.5-1 mg by mouth daily as needed. TAKE ONE-HALF (1/2) TO ONE (1) TABLET BY MOUTH DAILY AS NEEDED FOR ANXIETY, PANIC, OR THROAT SWELLING   losartan 100 MG tablet Commonly known as: COZAAR Take 100 mg by mouth daily.   Ozempic (0.25 or 0.5 MG/DOSE) 2 MG/3ML Sopn Generic drug: Semaglutide(0.25 or 0.5MG /DOS) Inject 0.5 mg into the skin once a week.   PARoxetine 20 MG tablet Commonly known as: PAXIL Take 20 mg by mouth daily.   pramipexole 0.125 MG tablet Commonly known as: MIRAPEX TAKE 1 TABLET BY MOUTH 2 HOURS BEFORE BEDTIME, MAY TITRATE BY 1 TABLET PER WEEK AS NEEDED MAXIMUM 4 TABLETS.   rOPINIRole 0.5 MG tablet Commonly known as: REQUIP Take 3 tablets by mouth at bedtime.   rosuvastatin 5 MG tablet Commonly known as: CRESTOR Take 5 mg by mouth daily.   sacubitril-valsartan 24-26 MG Commonly known as: ENTRESTO Take 1 tablet by mouth 2 (two) times daily.   Soliqua 100-33 UNT-MCG/ML Sopn Generic drug: Insulin Glargine-Lixisenatide Inject 60 Units into the skin in the morning.  spironolactone 25 MG tablet Commonly known as: ALDACTONE Take 0.5 tablets (12.5 mg total) by mouth daily. What changed:  how much to take when to take this   tiotropium 18 MCG inhalation capsule Commonly known as: SPIRIVA Place 18 mcg into inhaler and inhale daily as needed.               Discharge Care Instructions  (From admission, onward)           Start     Ordered   08/08/22 0000  Change dressing (specify)       Comments: Dressing change: daily with gentamicin ointment.   08/08/22 1505            Discharge Exam: Filed Weights   08/06/22 1020  Weight: 109.3 kg   Physical Exam Constitutional:      Appearance: Normal appearance.  Eyes:     Extraocular Movements: Extraocular movements intact.     Pupils: Pupils are equal, round, and reactive to light.  Cardiovascular:     Rate and Rhythm: Normal  rate and regular rhythm.  Pulmonary:     Effort: Pulmonary effort is normal.  Abdominal:     General: Abdomen is flat.     Palpations: Abdomen is soft.  Musculoskeletal:        General: Normal range of motion.     Comments: Dressing on left great toe  Neurological:     General: No focal deficit present.     Mental Status: He is alert and oriented to person, place, and time.  Psychiatric:        Mood and Affect: Mood normal.        Behavior: Behavior normal.      Condition at discharge: good  The results of significant diagnostics from this hospitalization (including imaging, microbiology, ancillary and laboratory) are listed below for reference.   Imaging Studies: MR TOES LEFT WO CONTRAST  Result Date: 08/06/2022 CLINICAL DATA:  Foot swelling, diabetic, osteomyelitis suspected, xray done EXAM: MRI OF THE LEFT TOES WITHOUT CONTRAST TECHNIQUE: Multiplanar, multisequence MR imaging of the left toes (great toe) was performed. No intravenous contrast was administered. COMPARISON:  Left foot radiograph 08/06/2022 FINDINGS: Bones/Joint/Cartilage There is a comminuted fracture of the distal phalanx there is associated marrow edema. T1 signal is relatively preserved. Mild reactive marrow edema in the second metatarsal head. Ligaments Intact Lisfranc ligament. Muscles and Tendons Intramuscular edema and atrophy in the foot, compatible with chronic denervation change. Soft tissues There is skin thickening and soft tissue swelling of the great toe. There is dorsal foot soft tissue swelling. No organized fluid collection. IMPRESSION: Comminuted fracture of the distal phalanx of the great toe. Associated marrow edema with preserved T1 marrow signal which could reflect reactive marrow change or early osteomyelitis. Skin thickening and soft tissue swelling of the great toe, as can be seen in cellulitis. Additional dorsal soft tissue swelling of the foot. No evidence of an organized/drainable fluid  collection. Electronically Signed   By: Maurine Simmering M.D.   On: 08/06/2022 14:21   DG Foot Complete Left  Result Date: 08/06/2022 CLINICAL DATA:  Great toe left foot red and swollen.  Diabetic EXAM: LEFT FOOT - COMPLETE 3 VIEW COMPARISON:  None Available. FINDINGS: There is a comminuted fracture involving the distal aspect of the distal phalanx of the great toe with displaced fragments. No additional fracture or dislocation. Associated soft tissue swelling. Small osteophytes along the first metatarsophalangeal joint. Plantar and Achilles calcaneal spurs. Vascular calcifications are  seen. IMPRESSION: Comminuted fracture of the distal phalanx of the great toe. No secondary erosive changes. Soft tissue swelling. If there is further concern infection additional workup as clinically directed. Please correlate for any history of trauma Electronically Signed   By: Jill Side M.D.   On: 08/06/2022 10:56    Microbiology: Results for orders placed or performed during the hospital encounter of 08/06/22  Blood culture (single)     Status: None (Preliminary result)   Collection Time: 08/06/22 10:50 AM   Specimen: BLOOD  Result Value Ref Range Status   Specimen Description BLOOD RIGHT [facility]  Final   Special Requests   Final    BOTTLES DRAWN AEROBIC AND ANAEROBIC Blood Culture adequate volume   Culture   Final    NO GROWTH 2 DAYS Performed at [facility], [address], [place], [place] [address]    Report Status PENDING  Incomplete  Blood culture (single)     Status: None (Preliminary result)   Collection Time: 08/06/22 11:00 AM   Specimen: BLOOD  Result Value Ref Range Status   Specimen Description BLOOD LEFT AC  Final   Special Requests   Final    BOTTLES DRAWN AEROBIC AND ANAEROBIC Blood Culture adequate volume   Culture   Final    NO GROWTH 2 DAYS Performed at [facility], [address], [place], [place] [address]    Report Status PENDING  Incomplete  Aerobic/Anaerobic  Culture w Gram Stain (surgical/deep wound)     Status: None (Preliminary result)   Collection Time: 08/06/22  7:44 PM   Specimen: Wound  Result Value Ref Range Status   Specimen Description   Final    WOUND Performed at [facility], [address] [place], [place] [address]    Special Requests   Final    NONE Performed at [facility], [address] [place], [place] [address]    Gram Stain   Final    RARE WBC PRESENT,BOTH PMN AND MONONUCLEAR NO ORGANISMS SEEN    Culture   Final    FEW STAPHYLOCOCCUS AUREUS SUSCEPTIBILITIES TO FOLLOW Performed at [facility], [place] [address] [place], [place] [address]    Report Status PENDING  Incomplete  MRSA Next Gen by PCR, Nasal     Status: Abnormal   Collection Time: 08/06/22  7:44 PM   Specimen: Nasal Mucosa; Nasal Swab  Result Value Ref Range Status   MRSA by PCR Next Gen DETECTED (A) NOT DETECTED Final    Comment: RESULT CALLED TO, READ BACK BY AND VERIFIED WITH: MARCEL TURNER 2107 08/06/22 MU (NOTE) The GeneXpert MRSA Assay (FDA approved for NASAL specimens only), is one component of a comprehensive MRSA colonization surveillance program. It is not intended to diagnose MRSA infection nor to guide or monitor treatment for MRSA infections. Test performance is not FDA approved in patients less than 67 years old. Performed at [facility], [address] [place], [place] [address]     Labs: CBC: Recent Labs  Lab 08/06/22 1058 08/07/22 0616  WBC 9.3 7.4  NEUTROABS 6.5  --   HGB 15.0 13.6  HCT 44.3 39.6  MCV 85.2 83.9  PLT 242 [id number]   Basic Metabolic Panel: Recent Labs  Lab 08/06/22 1058 08/07/22 0616 08/08/22 0853  NA 135 134*  --   K 3.8 3.8  --   CL 100 104  --   CO2 23 23  --   GLUCOSE 150* 158*  --  BUN 29* 27*  --   CREATININE 1.26* 1.10 1.01  CALCIUM 9.2 8.6*  --    Liver Function Tests: Recent Labs  Lab 08/07/22 0616  AST 14*  ALT 11  ALKPHOS 66  BILITOT 0.6   PROT 6.3*  ALBUMIN 3.3*   CBG: Recent Labs  Lab 08/07/22 1214 08/07/22 1608 08/07/22 2132 08/08/22 0743 08/08/22 1142  GLUCAP 135* 143* 151* 140* 215*    Discharge time spent: greater than 30 minutes.  Signed: Oran Rein, MD [facility] 08/08/2022

## 2022-08-08 NOTE — Plan of Care (Signed)

## 2022-08-08 NOTE — Plan of Care (Signed)
  Problem: Education: Goal: Knowledge of General Education information will improve Description: Including pain rating scale, medication(s)/side effects and non-pharmacologic comfort measures Outcome: Progressing   Problem: Health Behavior/Discharge Planning: Goal: Ability to manage health-related needs will improve Outcome: Progressing   Problem: Clinical Measurements: Goal: Diagnostic test results will improve Outcome: Progressing Goal: Cardiovascular complication will be avoided Outcome: Progressing   Problem: Nutrition: Goal: Adequate nutrition will be maintained Outcome: Progressing   Problem: Elimination: Goal: Will not experience complications related to urinary retention Outcome: Progressing   Problem: Pain Managment: Goal: General experience of comfort will improve Outcome: Progressing

## 2022-08-08 NOTE — Progress Notes (Signed)
  Subjective:  Patient ID: Paul Goodman, male    DOB: 1945/05/28,  MRN: [id number]  Patient seen at bedside, says he feels like the toe is improving.  Not having any pain  Negative for chest pain and shortness of breath Fever: no Night sweats: no Constitutional signs: no Objective:   Vitals:   08/08/22 0002 08/08/22 0800  BP: 116/79 115/65  Pulse: 98 (!) 46  Resp: 20 17  Temp: 98.7 F (37.1 C) 98.8 F (37.1 C)  SpO2: 97% 100%   General AA&O x3. Normal mood and affect.  Vascular Dorsalis pedis and posterior tibial pulses 1/4 bilat. Brisk capillary refill to all digits. Pedal hair present.  Neurologic Epicritic sensation grossly absent.  Dermatologic Cellulitis receding, heat has resolved, less drainage today  Orthopedic: MMT 5/5 in dorsiflexion, plantarflexion, inversion, and eversion. Normal joint ROM without pain or crepitus.   Culture 08/06/2022 with Staph aureus, sensitivities not resulted  Assessment & Plan:  Patient was evaluated and treated and all questions answered.  Cellulitis, nailbed injury, open fracture left hallux -Patient improving.  I do think he is stable for discharge on broad-spectrum antibiotics doxycycline and Augmentin twice daily for 14 days.  He is eager to get home, his wife is at home by herself and has early dementia. -Will have follow-up scheduled this Monday for 824 with me in [place] -, We discussed if he continues to heal the amputation is not necessary.  If it does not heal and cellulitis does not fully resolve will need partial toe amputation as an outpatient. -WBAT in postop shoe, please have for patient prior to discharge -Change dressing daily at home with gauze and gentamicin ointment  Lanae Crumbly, DPM 08/08/2022    Criselda Peaches, DPM  Accessible via secure chat for questions or concerns.

## 2022-08-08 NOTE — TOC CM/SW Note (Signed)
Patient has orders to discharge home today. Chart reviewed. No TOC needs identified. CSW signing off.  Kalana Yust, CSW [phone]  

## 2022-08-09 ENCOUNTER — Telehealth: Payer: Self-pay

## 2022-08-09 NOTE — Telephone Encounter (Signed)
Patient is aware and verbalized understanding of information given. 

## 2022-08-11 LAB — CULTURE, BLOOD (SINGLE)
Culture: NO GROWTH
Culture: NO GROWTH
Special Requests: ADEQUATE
Special Requests: ADEQUATE

## 2022-08-12 LAB — AEROBIC/ANAEROBIC CULTURE W GRAM STAIN (SURGICAL/DEEP WOUND)

## 2022-08-13 ENCOUNTER — Ambulatory Visit: Payer: Federal, State, Local not specified - PPO | Admitting: Podiatry

## 2022-08-13 ENCOUNTER — Encounter: Payer: Self-pay | Admitting: Podiatry

## 2022-08-13 VITALS — BP 120/68 | HR 61

## 2022-08-13 DIAGNOSIS — L97522 Non-pressure chronic ulcer of other part of left foot with fat layer exposed: Secondary | ICD-10-CM | POA: Diagnosis not present

## 2022-08-13 DIAGNOSIS — S92422D Displaced fracture of distal phalanx of left great toe, subsequent encounter for fracture with routine healing: Secondary | ICD-10-CM | POA: Diagnosis not present

## 2022-08-13 NOTE — Progress Notes (Signed)
  Subjective:  Patient ID: Paul Goodman, male    DOB: 12/04/1945,  MRN: [id number]  Chief Complaint  Patient presents with   Wound Check    "It's doing good, it's doing better."    77 y.o. male presents with the above complaint. History confirmed with patient.  He says he notes improvement.  He has been applying mupirocin ointment daily.  Objective:  Physical Exam: warm, good capillary refill, normal DP and PT pulses, and abnormal sensory exam with diffuse neuropathy, he has full-thickness ulceration with fibrogranular wound bed around the left hallux, healing well, slight erythema, no tenderness.      Assessment:   1. Skin ulcer of toe of left foot with fat layer exposed   2. Open displaced fracture of distal phalanx of left great toe with routine healing, subsequent encounter      Plan:  Patient was evaluated and treated and all questions answered.   Wound appears to be healing well, recommend he continue daily application of ointment.  I like to see him back in 1 week for follow-up.  We will take new radiographs at that time.  Do not expect fracture to fully heal given amount of crush and displacement but as long as we are not seeing signs of worsening osteolysis or osteomyelitis develop should not require amputation of the toe.  Advised this will take several weeks to months to heal  Return in about 1 week (around 08/20/2022) for wound care, new xrays left foot.

## 2022-08-21 ENCOUNTER — Ambulatory Visit (INDEPENDENT_AMBULATORY_CARE_PROVIDER_SITE_OTHER): Payer: Federal, State, Local not specified - PPO | Admitting: Podiatry

## 2022-08-21 DIAGNOSIS — L97522 Non-pressure chronic ulcer of other part of left foot with fat layer exposed: Secondary | ICD-10-CM

## 2022-08-21 DIAGNOSIS — S92422D Displaced fracture of distal phalanx of left great toe, subsequent encounter for fracture with routine healing: Secondary | ICD-10-CM

## 2022-08-21 MED ORDER — DOXYCYCLINE HYCLATE 100 MG PO TABS: 100.0000 mg | ORAL_TABLET | Freq: Two times a day (BID) | ORAL | 0 refills | Status: DC

## 2022-08-21 NOTE — Progress Notes (Signed)
  Subjective:  Patient ID: Paul Goodman, male    DOB: 07-Jun-1945,  MRN: [id number]  Chief Complaint  Patient presents with   Foot Ulcer    2 week follow up left toe    77 y.o. male presents with the above complaint. History confirmed with patient.  Says he feels like it is feeling better.  Still using the mupirocin.  Objective:  Physical Exam: warm, good capillary refill, normal DP and PT pulses, and abnormal sensory exam with diffuse neuropathy, ulceration appears to be healing no longer probes deep, nailbed is healing, scab forming.  Redness improving.    Culture eventually grew MRSA   Assessment:   1. Skin ulcer of toe of left foot with fat layer exposed   2. Open displaced fracture of distal phalanx of left great toe with routine healing, subsequent encounter      Plan:  Patient was evaluated and treated and all questions answered.  Still continues to be healing.  I placed him on doxycycline to treat the MRSA.  Continue mupirocin.  I will see him back in 2 weeks for follow-up we will take new radiographs on that visit.  Return in about 2 weeks (around 09/04/2022) for follow  up wound (Take new xrays).

## 2022-08-28 ENCOUNTER — Telehealth: Payer: Self-pay | Admitting: *Deleted

## 2022-08-28 MED ORDER — DOXYCYCLINE HYCLATE 100 MG PO TABS: 100.0000 mg | ORAL_TABLET | Freq: Two times a day (BID) | ORAL | 0 refills | Status: DC

## 2022-08-28 NOTE — Telephone Encounter (Signed)
Patient is calling for the status of an antibiotic that was supposed to be sent to pharmacy after visit last week. Explained that the script failed going to pharmacy, will resend to pharmacy on file, verbalized understanding and will pick up.

## 2022-09-04 ENCOUNTER — Ambulatory Visit: Payer: Medicare Other

## 2022-09-04 ENCOUNTER — Ambulatory Visit (INDEPENDENT_AMBULATORY_CARE_PROVIDER_SITE_OTHER): Payer: Federal, State, Local not specified - PPO | Admitting: Podiatry

## 2022-09-04 DIAGNOSIS — S92422D Displaced fracture of distal phalanx of left great toe, subsequent encounter for fracture with routine healing: Secondary | ICD-10-CM

## 2022-09-04 DIAGNOSIS — L97522 Non-pressure chronic ulcer of other part of left foot with fat layer exposed: Secondary | ICD-10-CM

## 2022-09-04 NOTE — Progress Notes (Signed)
  Subjective:  Patient ID: Paul Goodman, male    DOB: 1945/07/15,  MRN: [id number]  Chief Complaint  Patient presents with   Foot Ulcer    2 week follow up left great toe, looks much better    77 y.o. male presents with the above complaint. History confirmed with patient.  He notes it is improving not having any drainage at all  Objective:  Physical Exam: warm, good capillary refill, normal DP and PT pulses, and abnormal sensory exam with diffuse neuropathy, some scab still present, ulceration has healed, minimal erythema today, still slight edema    Culture eventually grew MRSA  Radiographs taken today show no new bony erosions there is increasing consolidation callus formation around the fracture site Assessment:   1. Skin ulcer of toe of left foot with fat layer exposed (HCC)   2. Open displaced fracture of distal phalanx of left great toe with routine healing, subsequent encounter      Plan:  Patient was evaluated and treated and all questions answered.  Radiographs encouraging that the fracture is healing uneventfully, do not see any indication of new erosions osteomyelitis or gas infection, I did recommend we check his inflammatory markers and order for ESR CBC and a CRP were given to him, he will get that checked at [facility].  I will see him back in 3 weeks for new follow-up x-rays  No follow-ups on file.

## 2022-09-22 LAB — C-REACTIVE PROTEIN: CRP: 2 mg/L (ref 0–10)

## 2022-09-22 LAB — CBC WITH DIFFERENTIAL/PLATELET
Basophils Absolute: 0.1 10*3/uL (ref 0.0–0.2)
Basos: 1 %
EOS (ABSOLUTE): 0.1 10*3/uL (ref 0.0–0.4)
Eos: 2 %
Hematocrit: 43.5 % (ref 37.5–51.0)
Hemoglobin: 14.4 g/dL (ref 13.0–17.7)
Immature Grans (Abs): 0 10*3/uL (ref 0.0–0.1)
Immature Granulocytes: 0 %
Lymphocytes Absolute: 2.7 10*3/uL (ref 0.7–3.1)
Lymphs: 31 %
MCH: 29.1 pg (ref 26.6–33.0)
MCHC: 33.1 g/dL (ref 31.5–35.7)
MCV: 88 fL (ref 79–97)
Monocytes Absolute: 0.7 10*3/uL (ref 0.1–0.9)
Monocytes: 8 %
Neutrophils Absolute: 5.2 10*3/uL (ref 1.4–7.0)
Neutrophils: 58 %
Platelets: 228 10*3/uL (ref 150–450)
RBC: 4.94 x10E6/uL (ref 4.14–5.80)
RDW: 13.9 % (ref 11.6–15.4)
WBC: 8.8 10*3/uL (ref 3.4–10.8)

## 2022-09-22 LAB — SEDIMENTATION RATE: Sed Rate: 3 mm/hr (ref 0–30)

## 2022-09-26 ENCOUNTER — Ambulatory Visit (INDEPENDENT_AMBULATORY_CARE_PROVIDER_SITE_OTHER): Payer: Federal, State, Local not specified - PPO

## 2022-09-26 ENCOUNTER — Ambulatory Visit (INDEPENDENT_AMBULATORY_CARE_PROVIDER_SITE_OTHER): Payer: Federal, State, Local not specified - PPO | Admitting: Podiatry

## 2022-09-26 DIAGNOSIS — L97522 Non-pressure chronic ulcer of other part of left foot with fat layer exposed: Secondary | ICD-10-CM | POA: Diagnosis not present

## 2022-09-26 NOTE — Progress Notes (Signed)
  Subjective:  Patient ID: Paul Goodman, male    DOB: 09/06/1945,  MRN: [id number]  Chief Complaint  Patient presents with   Foot Ulcer    3 week follow up left toe - discuss lab results    77 y.o. male presents with the above complaint. History confirmed with patient.  He returns for follow-up feels he is doing much better, he is not having any drainage still  Objective:  Physical Exam: warm, good capillary refill, normal DP and PT pulses, and abnormal sensory exam with diffuse neuropathy, some partial-thickness skin breakdown still noted, measures 1.2 x 0.4 x 0.1 cm.  No active drainage.  No cellulitis.    Culture eventually grew MRSA  CBC sed rate and ESR are all within normal limits taken 09/21/2022  Radiographs taken today show no new bony erosions or osteolysis, there is unchanged alignment of the fracture with incomplete healing Assessment:   1. Skin ulcer of toe of left foot with fat layer exposed (HCC)      Plan:  Patient was evaluated and treated and all questions answered.  Radiographs taken today show no worsening signs of infection or osteomyelitis.  We discussed that there is incomplete fracture healing likely will not have full fracture healing due to the crush injury of the distal phalanx but I do not expect this to give him any issues.  The wound is nearly fully healed.  We will change to Prisma silver collagen dressings applied daily or every other day.  He will return in 4 weeks for follow-up for ongoing wound care.  Hopefully nearly fully healed at that point.  WBAT in regular shoe gear.  Lab work reviewed within normal limits do not see indication for further antibiotics at this point  Return in about 4 weeks (around 10/24/2022) for wound care.

## 2022-10-10 ENCOUNTER — Emergency Department
Admission: EM | Admit: 2022-10-10 | Discharge: 2022-10-10 | Disposition: A | Discharge status: Home / Self Care | Payer: Federal, State, Local not specified - PPO | Attending: Emergency Medicine | Admitting: Emergency Medicine

## 2022-10-10 ENCOUNTER — Encounter: Payer: Self-pay | Admitting: Emergency Medicine

## 2022-10-10 ENCOUNTER — Other Ambulatory Visit: Payer: Self-pay

## 2022-10-10 DIAGNOSIS — E119 Type 2 diabetes mellitus without complications: Secondary | ICD-10-CM | POA: Insufficient documentation

## 2022-10-10 DIAGNOSIS — W19XXXA Unspecified fall, initial encounter: Secondary | ICD-10-CM | POA: Insufficient documentation

## 2022-10-10 DIAGNOSIS — M545 Low back pain, unspecified: Secondary | ICD-10-CM | POA: Insufficient documentation

## 2022-10-10 DIAGNOSIS — M549 Dorsalgia, unspecified: Secondary | ICD-10-CM | POA: Diagnosis present

## 2022-10-10 DIAGNOSIS — G8929 Other chronic pain: Secondary | ICD-10-CM | POA: Insufficient documentation

## 2022-10-10 DIAGNOSIS — J449 Chronic obstructive pulmonary disease, unspecified: Secondary | ICD-10-CM | POA: Diagnosis not present

## 2022-10-10 DIAGNOSIS — I509 Heart failure, unspecified: Secondary | ICD-10-CM | POA: Diagnosis not present

## 2022-10-10 MED ORDER — DEXAMETHASONE SODIUM PHOSPHATE 10 MG/ML IJ SOLN
10.0000 mg | Freq: Once | INTRAMUSCULAR | Status: AC
  Administered 2022-10-10: 10 mg via INTRAMUSCULAR
  Filled 2022-10-10: qty 1

## 2022-10-10 MED ORDER — OXYCODONE-ACETAMINOPHEN 10-325 MG PO TABS: 1.0000 | ORAL_TABLET | Freq: Four times a day (QID) | ORAL | 0 refills | Status: AC | PRN

## 2022-10-10 NOTE — ED Triage Notes (Signed)
Pt via POV from home. Pt c/o lower back pain for the past 30 years, states it got worse the past 3 days. Pt did have a fall yesterday due to the back pain but denies any other injuries or pain from the fall. States he needs a "shot." Denies any hx of back surgeries. Pt is A&Ox4 and NAD, ambulatory to triage.

## 2022-10-10 NOTE — ED Notes (Signed)
See triage note  Presents with lower back pain  States he has had back pain for several years  States he fell 3 days ago d/t pain  Unable to sleep d/t pain  ambulates slowly d/t increased pain

## 2022-10-10 NOTE — ED Provider Notes (Signed)
[facility] Provider Note    Event Date/Time   First MD Initiated Contact with Patient 10/10/22 [phone]     (approximate)   History   Back Pain   HPI  Paul Goodman is a 77 y.o. male  with history of COPD, DM, CHF, A-fib the and as listed in EMR presents to the emergency department for pain control of acute on chronic back pain. He had a fall yesterday, but denies injury to his back.    Physical Exam   Triage Vital Signs: ED Triage Vitals  Enc Vitals Group     BP 10/10/22 0711 (!) 135/97     Pulse Rate 10/10/22 0711 63     Resp 10/10/22 0711 18     Temp 10/10/22 0711 98.1 F (36.7 C)     Temp Source 10/10/22 0711 Oral     SpO2 10/10/22 0711 97 %     Weight 10/10/22 0715 247 lb (112 kg)     Height 10/10/22 0715 6' 2.5" (1.892 m)     Head Circumference --      Peak Flow --      Pain Score 10/10/22 0715 10     Pain Loc --      Pain Edu? --      Excl. in GC? --     Most recent vital signs: Vitals:   10/10/22 0711  BP: (!) 135/97  Pulse: 63  Resp: 18  Temp: 98.1 F (36.7 C)  SpO2: 97%    General: Awake, no distress.  CV:  Good peripheral perfusion.  Resp:  Normal effort.  Abd:  No distention.  Other:  Ambulates without assistance.   ED Results / Procedures / Treatments   Labs (all labs ordered are listed, but only abnormal results are displayed) Labs Reviewed - No data to display   EKG  Not indicated.   RADIOLOGY  Image and radiology report reviewed and interpreted by me. Radiology report consistent with the same.  Declined  PROCEDURES:  Critical Care performed: No  Procedures   MEDICATIONS ORDERED IN ED:  Medications  dexamethasone (DECADRON) injection 10 mg (10 mg Intramuscular Given 10/10/22 0826)     IMPRESSION / MDM / ASSESSMENT AND PLAN / ED COURSE   I have reviewed the triage note.  Differential diagnosis includes, but is not limited to, acute on chronic pain; lumbar vertebral injury.  Patient's  presentation is most consistent with acute complicated illness / injury requiring diagnostic workup.  77 year old male presenting to the emergency department for treatment and evaluation of acute on chronic low back pain.  Pain is unchanged in location.  Patient is very disgruntled and states that he has had to repeat his symptoms multiple times during this ER visit.  [facility] which he had been on for many years.  He does not wish to have x-ray imaging of the lumbar spine today despite falling yesterday.  Patient states that he doubts I will do anything to help him today.  I advised him that I could provide him with a short course of pain medication however in order to get additional refills that he will need to see his primary care provider, the [facility], or chronic pain management provider.  He was advised that he will not likely receive multiple prescriptions for pain medications in the emergency department as we do not manage chronic pain.  As he was leaving, he again  reminded staff that he has had to repeat himself multiple times during this visit and does not feel that any of that was necessary.     FINAL CLINICAL IMPRESSION(S) / ED DIAGNOSES   Final diagnoses:  Chronic bilateral low back pain without sciatica     Rx / DC Orders   ED Discharge Orders          Ordered    oxyCODONE-acetaminophen (PERCOCET) 10-325 MG tablet  Every 6 hours PRN        10/10/22 1610             Note:  This document was prepared using Dragon voice recognition software and may include unintentional dictation errors.   Chinita Pester, FNP 10/10/22 1515    Sharman Cheek, MD 10/11/22 [phone]

## 2022-10-10 NOTE — Discharge Instructions (Signed)
See department does not manage chronic pain.  You will need to follow-up with your primary care provider for a referral to pain management.

## 2022-10-26 ENCOUNTER — Ambulatory Visit: Payer: Medicare Other | Admitting: Podiatry

## 2022-11-03 ENCOUNTER — Other Ambulatory Visit: Payer: Self-pay | Admitting: Podiatry

## 2022-11-04 NOTE — Telephone Encounter (Signed)
Patient needs appt for antibiotic refills

## 2022-11-26 ENCOUNTER — Ambulatory Visit: Payer: Medicare Other | Admitting: Podiatry

## 2022-11-28 ENCOUNTER — Encounter: Payer: Self-pay | Admitting: Podiatry

## 2022-11-28 ENCOUNTER — Ambulatory Visit: Payer: Federal, State, Local not specified - PPO | Admitting: Podiatry

## 2022-11-28 VITALS — BP 136/74 | HR 62

## 2022-11-28 DIAGNOSIS — L97522 Non-pressure chronic ulcer of other part of left foot with fat layer exposed: Secondary | ICD-10-CM | POA: Diagnosis not present

## 2022-11-28 NOTE — Progress Notes (Signed)
  Subjective:  Patient ID: Paul Goodman, male    DOB: 19-Nov-1945,  MRN: [id number]  Chief Complaint  Patient presents with   Wound Check    "I think it's doing good."    77 y.o. male presents with the above complaint. History confirmed with patient.  He is doing well has not had any drainage no ulceration Objective:  Physical Exam: warm, good capillary refill, normal DP and PT pulses, and abnormal sensory exam with diffuse neuropathy, wound has healed, no edema, minimal erythema.  No active drainage.  No cellulitis.   Assessment:   1. Skin ulcer of toe of left foot with fat layer exposed (HCC)      Plan:  Patient was evaluated and treated and all questions answered.  Overall has healed uneventfully.  No recurrence of ulceration.  No signs of infection.  Return as needed if this returns or worsens.   Return if symptoms worsen or fail to improve.

## 2023-01-23 ENCOUNTER — Ambulatory Visit
Admission: RE | Admit: 2023-01-23 | Payer: Federal, State, Local not specified - PPO | Source: Home / Self Care | Admitting: Internal Medicine

## 2023-01-23 ENCOUNTER — Encounter: Admission: RE | Payer: Self-pay | Source: Home / Self Care

## 2023-01-23 SURGERY — COLONOSCOPY WITH PROPOFOL
Anesthesia: General

## 2023-02-09 ENCOUNTER — Emergency Department: Payer: Federal, State, Local not specified - PPO

## 2023-02-09 ENCOUNTER — Emergency Department
Admission: EM | Admit: 2023-02-09 | Discharge: 2023-02-09 | Disposition: A | Discharge status: Home / Self Care | Payer: Federal, State, Local not specified - PPO | Attending: Emergency Medicine | Admitting: Emergency Medicine

## 2023-02-09 ENCOUNTER — Other Ambulatory Visit: Payer: Self-pay

## 2023-02-09 DIAGNOSIS — Z79899 Other long term (current) drug therapy: Secondary | ICD-10-CM | POA: Insufficient documentation

## 2023-02-09 DIAGNOSIS — Z7901 Long term (current) use of anticoagulants: Secondary | ICD-10-CM | POA: Diagnosis not present

## 2023-02-09 DIAGNOSIS — Z1152 Encounter for screening for COVID-19: Secondary | ICD-10-CM | POA: Diagnosis not present

## 2023-02-09 DIAGNOSIS — J449 Chronic obstructive pulmonary disease, unspecified: Secondary | ICD-10-CM | POA: Insufficient documentation

## 2023-02-09 DIAGNOSIS — R531 Weakness: Secondary | ICD-10-CM | POA: Diagnosis present

## 2023-02-09 DIAGNOSIS — I509 Heart failure, unspecified: Secondary | ICD-10-CM | POA: Insufficient documentation

## 2023-02-09 DIAGNOSIS — I959 Hypotension, unspecified: Secondary | ICD-10-CM | POA: Insufficient documentation

## 2023-02-09 DIAGNOSIS — Z7984 Long term (current) use of oral hypoglycemic drugs: Secondary | ICD-10-CM | POA: Insufficient documentation

## 2023-02-09 DIAGNOSIS — R42 Dizziness and giddiness: Secondary | ICD-10-CM

## 2023-02-09 DIAGNOSIS — N182 Chronic kidney disease, stage 2 (mild): Secondary | ICD-10-CM | POA: Diagnosis not present

## 2023-02-09 DIAGNOSIS — E1122 Type 2 diabetes mellitus with diabetic chronic kidney disease: Secondary | ICD-10-CM | POA: Insufficient documentation

## 2023-02-09 DIAGNOSIS — Z794 Long term (current) use of insulin: Secondary | ICD-10-CM | POA: Insufficient documentation

## 2023-02-09 DIAGNOSIS — I129 Hypertensive chronic kidney disease with stage 1 through stage 4 chronic kidney disease, or unspecified chronic kidney disease: Secondary | ICD-10-CM | POA: Insufficient documentation

## 2023-02-09 LAB — LIPASE, BLOOD: Lipase: 51 U/L (ref 11–51)

## 2023-02-09 LAB — COMPREHENSIVE METABOLIC PANEL
ALT: 16 U/L (ref 0–44)
AST: 15 U/L (ref 15–41)
Albumin: 3.6 g/dL (ref 3.5–5.0)
Alkaline Phosphatase: 75 U/L (ref 38–126)
Anion gap: 11 (ref 5–15)
BUN: 21 mg/dL (ref 8–23)
CO2: 23 mmol/L (ref 22–32)
Calcium: 9.1 mg/dL (ref 8.9–10.3)
Chloride: 104 mmol/L (ref 98–111)
Creatinine, Ser: 0.92 mg/dL (ref 0.61–1.24)
GFR, Estimated: >60 mL/min (ref >60)
Glucose, Bld: 171 mg/dL — ABNORMAL HIGH (ref 70–99)
Potassium: 4.2 mmol/L (ref 3.5–5.1)
Sodium: 138 mmol/L (ref 135–145)
Total Bilirubin: 0.9 mg/dL (ref 0.3–1.2)
Total Protein: 7.3 g/dL (ref 6.5–8.1)

## 2023-02-09 LAB — URINALYSIS, ROUTINE W REFLEX MICROSCOPIC
Bilirubin Urine: NEGATIVE
Glucose, UA: NEGATIVE mg/dL
Hgb urine dipstick: NEGATIVE
Ketones, ur: NEGATIVE mg/dL
Leukocytes,Ua: NEGATIVE
Nitrite: NEGATIVE
Protein, ur: NEGATIVE mg/dL
Specific Gravity, Urine: 1.027 (ref 1.005–1.030)
pH: 6 (ref 5.0–8.0)

## 2023-02-09 LAB — CBC
HCT: 38.5 % — ABNORMAL LOW (ref 39.0–52.0)
Hemoglobin: 13.3 g/dL (ref 13.0–17.0)
MCH: 28.9 pg (ref 26.0–34.0)
MCHC: 34.5 g/dL (ref 30.0–36.0)
MCV: 83.7 fL (ref 80.0–100.0)
Platelets: 222 10*3/uL (ref 150–400)
RBC: 4.6 MIL/uL (ref 4.22–5.81)
RDW: 13.2 % (ref 11.5–15.5)
WBC: 10.5 10*3/uL (ref 4.0–10.5)
nRBC: 0 % (ref 0.0–0.2)

## 2023-02-09 LAB — PROCALCITONIN: Procalcitonin: 0.24 ng/mL

## 2023-02-09 LAB — RESP PANEL BY RT-PCR (RSV, FLU A&B, COVID)  RVPGX2
Influenza A by PCR: NEGATIVE
Influenza B by PCR: NEGATIVE
Resp Syncytial Virus by PCR: NEGATIVE
SARS Coronavirus 2 by RT PCR: NEGATIVE

## 2023-02-09 LAB — CBG MONITORING, ED: Glucose-Capillary: 143 mg/dL — ABNORMAL HIGH (ref 70–99)

## 2023-02-09 LAB — LACTIC ACID, PLASMA: Lactic Acid, Venous: 1.6 mmol/L (ref 0.5–1.9)

## 2023-02-09 LAB — TROPONIN I (HIGH SENSITIVITY)
Troponin I (High Sensitivity): 11 ng/L (ref <18)
Troponin I (High Sensitivity): 9 ng/L (ref <18)

## 2023-02-09 MED ORDER — SODIUM CHLORIDE 0.9 % IV BOLUS
500.0000 mL | Freq: Once | INTRAVENOUS | Status: AC
  Administered 2023-02-09: 500 mL via INTRAVENOUS

## 2023-02-09 NOTE — ED Provider Notes (Signed)
[facility] Provider Note    Event Date/Time   First MD Initiated Contact with Patient 02/09/23 0345     (approximate)   History   Hypotension   HPI  Paul Goodman is a 77 y.o. male who presents to the ED from home with a chief complaint of hypotension.  Patient was still awake, felt weak and dizzy so he checked his blood pressure which was 75/47.  Presents for blood pressure check as he is unsure if his home cuff was accurate.  Reports flulike symptoms last week.  Denies headache, vision changes, chest pain, shortness of breath, abdominal pain, nausea, vomiting or diarrhea.     Past Medical History   Past Medical History:  Diagnosis Date   Anxiety    Arthritis    CHF (congestive heart failure) (HCC)    COPD (chronic obstructive pulmonary disease) (HCC)    Diabetes (HCC)    ED (erectile dysfunction)    Heart murmur    Hyperlipidemia    Hypertension    Melanoma (HCC)    Sleep apnea      Active Problem List   Patient Active Problem List   Diagnosis Date Noted   Cellulitis of great toe of left foot 08/07/2022   Open displaced fracture of distal phalanx of left great toe 08/07/2022   Diabetic foot infection (HCC) 08/06/2022   Septic prepatellar bursitis of right knee 02/25/2021   Cellulitis of right foot 02/25/2021   Closed fracture of metatarsal shaft, right, initial encounter 02/25/2021   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/2021   CKD (chronic kidney disease), 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/2021   Hyperlipidemia    Chronic systolic CHF (congestive heart failure) (HCC)    Atrial fibrillation, chronic (HCC)    Depression with anxiety    Cardiomyopathy (HCC)    Permanent atrial fibrillation (HCC)    Acute CVA (cerebrovascular accident) (HCC) 06/02/2020   Prostate cancer screening 08/13/2018   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   Anxiety 01/14/2017   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/2017   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/14/2017   OSA on CPAP 01/14/2017   Restless leg syndrome 01/14/2017   Type 2 diabetes mellitus with hyperglycemia (HCC) 04/23/2016   Arthritis 02/29/2016   COPD (chronic obstructive pulmonary disease) (HCC) 02/29/2016   Diabetes mellitus (HCC) 02/29/2016   Hyperlipidemia, unspecified 02/29/2016   Hypertension 02/29/2016     Past Surgical History   Past Surgical History:  Procedure Laterality Date   EYE SURGERY Right    cancer   HAND SURGERY Left 1966   KNEE SURGERY Left 1973   TONSILLECTOMY  1964     Home Medications   Prior to Admission medications   Medication Sig Start Date End Date Taking? Authorizing Provider  albuterol (PROVENTIL HFA;VENTOLIN HFA) 108 (90 BASE) MCG/ACT inhaler INHALE 2 PUFFS EVERY 4 - 6 HOURS AS NEEDED 06/07/14   [provider]  amiodarone (PACERONE) 200 MG tablet Take 1 tablet (200 mg total) by mouth 2 (two) times daily. 06/06/20   Enedina Finner, MD  apixaban (ELIQUIS) 5 MG TABS tablet Take 1 tablet (5 mg total) by mouth 2 (two) times daily. 06/06/20   Enedina Finner, MD  atorvastatin (LIPITOR) 80 MG tablet Take 1 tablet (80 mg total) by mouth daily. 06/07/20   Enedina Finner, MD  carvedilol (COREG) 25 MG tablet Take 1 tablet (25 mg total) by mouth 2 (two) times daily with a meal. 06/06/20   Enedina Finner,  MD  doxycycline (VIBRA-TABS) 100 MG tablet Take 1 tablet (100 mg total) by mouth every 12 (twelve) hours. 08/28/22   Standiford, Alexander F, DPM  FARXIGA 10 MG TABS tablet Take 10 mg by mouth daily. 05/20/20   [provider]  fenofibrate 160 MG tablet Take 160 mg by mouth daily. 06/16/14   [provider]  furosemide (LASIX) 20 MG tablet Take 20 mg by mouth daily as needed. 12/15/18   [provider]  gabapentin (NEURONTIN) 100 MG capsule Take 100 mg by mouth 3 (three) times daily. 06/29/22   [provider]  gentamicin ointment (GARAMYCIN) 0.1 % Apply topically daily. 08/09/22   Kirstie Peri,  MD  glipiZIDE (GLUCOTROL XL) 5 MG 24 hr tablet Take 10 mg by mouth daily. 2 tablets daily 09/17/14   [provider]  hydrochlorothiazide (HYDRODIURIL) 25 MG tablet Take 25 mg by mouth daily. 06/21/22   [provider]  Insulin Glargine (BASAGLAR KWIKPEN) 100 UNIT/ML SMARTSIG:60 Unit(s) SUB-Q Every Night 06/16/22   [provider]  LORazepam (ATIVAN) 1 MG tablet Take 0.5-1 mg by mouth daily as needed. TAKE ONE-HALF (1/2) TO ONE (1) TABLET BY MOUTH DAILY AS NEEDED FOR ANXIETY, PANIC, OR THROAT SWELLING 05/30/20   [provider]  losartan (COZAAR) 100 MG tablet Take 100 mg by mouth daily. 04/18/22   [provider]  OZEMPIC, 0.25 OR 0.5 MG/DOSE, 2 MG/3ML SOPN Inject 0.5 mg into the skin once a week. 05/25/22   [provider]  PARoxetine (PAXIL) 20 MG tablet Take 20 mg by mouth daily. 12/07/13   [provider]  pramipexole (MIRAPEX) 0.125 MG tablet TAKE 1 TABLET BY MOUTH 2 HOURS BEFORE BEDTIME, MAY TITRATE BY 1 TABLET PER WEEK AS NEEDED MAXIMUM 4 TABLETS. 07/31/22   [provider]  rOPINIRole (REQUIP) 0.5 MG tablet Take 3 tablets by mouth at bedtime. 09/10/14   [provider]  rosuvastatin (CRESTOR) 5 MG tablet Take 5 mg by mouth daily. 07/27/22   [provider]  sacubitril-valsartan (ENTRESTO) 24-26 MG Take 1 tablet by mouth 2 (two) times daily. 06/06/20   Enedina Finner, MD  SOLIQUA 100-33 UNT-MCG/ML SOPN Inject 60 Units into the skin in the morning. 11/11/18   [provider]  spironolactone (ALDACTONE) 25 MG tablet Take 0.5 tablets (12.5 mg total) by mouth daily. Patient taking differently: Take 25 mg by mouth 2 (two) times daily. 06/07/20   Enedina Finner, MD  tiotropium (SPIRIVA) 18 MCG inhalation capsule Place 18 mcg into inhaler and inhale daily as needed. 03/23/16   [provider]     Allergies  Patient has no known allergies.   Family History   Family History  Problem Relation Age of Onset    Kidney Stones Father    Kidney disease Neg Hx    Prostate cancer Neg Hx    Kidney cancer Neg Hx    Bladder Cancer Neg Hx      Physical Exam  Triage Vital Signs: ED Triage Vitals  Encounter Vitals Group     BP      Systolic BP Percentile      Diastolic BP Percentile      Pulse      Resp      Temp      Temp src      SpO2      Weight      Height      Head Circumference      Peak Flow  Pain Score      Pain Loc      Pain Education      Exclude from Growth Chart     Updated Vital Signs: BP 110/82 (BP Location: Left Arm)   Pulse 94   Temp 98.4 F (36.9 C) (Oral)   Resp 16   Ht 6\' 2"  (1.88 m)   Wt 109.8 kg   SpO2 97%   BMI 31.07 kg/m    General: Awake, no distress.  CV:  Regular rate, irregular rhythm.  Good peripheral perfusion.  Resp:  Normal effort.  CTAB. Abd:  Nontender.  No distention.  Other:  Bilateral calves are supple and nontender.   ED Results / Procedures / Treatments  Labs (all labs ordered are listed, but only abnormal results are displayed) Labs Reviewed  CBC - Abnormal; Notable for the following components:      Result Value   HCT 38.5 (*)    All other components within normal limits  COMPREHENSIVE METABOLIC PANEL - Abnormal; Notable for the following components:   Glucose, Bld 171 (*)    All other components within normal limits  CBG MONITORING, ED - Abnormal; Notable for the following components:   Glucose-Capillary 143 (*)    All other components within normal limits  CULTURE, BLOOD (ROUTINE X 2)  CULTURE, BLOOD (ROUTINE X 2)  RESP PANEL BY RT-PCR (RSV, FLU A&B, COVID)  [id number]  URINE CULTURE  LIPASE, BLOOD  LACTIC ACID, PLASMA  PROCALCITONIN  URINALYSIS, ROUTINE W REFLEX MICROSCOPIC  TROPONIN I (HIGH SENSITIVITY)  TROPONIN I (HIGH SENSITIVITY)     EKG  ED ECG REPORT I, Zoeann Mol J, the attending physician, personally viewed and interpreted this ECG.   Date: 02/09/2023  EKG Time: 0351  Rate: 95  Rhythm: atrial  fibrillation, rate 95  Axis: Normal  Intervals: QTc 498  ST&T Change: Nonspecific    RADIOLOGY I have independently visualized and interpreted patient's imaging studies as well as noted the radiology interpretation:  CT head: No ICH  Chest x-ray: No acute cardiopulmonary process  Official radiology report(s): CT Head Wo Contrast  Result Date: 02/09/2023 CLINICAL DATA:  Hypotension and mental status change. EXAM: CT HEAD WITHOUT CONTRAST TECHNIQUE: Contiguous axial images were obtained from the base of the skull through the vertex without intravenous contrast. RADIATION DOSE REDUCTION: This exam was performed according to the departmental dose-optimization program which includes automated exposure control, adjustment of the mA and/or kV according to patient size and/or use of iterative reconstruction technique. COMPARISON:  06/02/2020 FINDINGS: Brain: Small chronic infarcts along the right frontal parietal convexity. Linear vascular type mineralization in the centrum semiovale and deep cerebellum. No evidence of acute infarct, acute hemorrhage, hydrocephalus, mass, or collection. Vascular: No hyperdense vessel or unexpected calcification. Skull: Normal. Negative for fracture or focal lesion. Sinuses/Orbits: No acute finding. IMPRESSION: 1. No acute or reversible finding. 2. Small remote right frontoparietal cortex infarcts. Electronically Signed   By: Tiburcio Pea M.D.   On: 02/09/2023 05:43   DG Chest Port 1 View  Result Date: 02/09/2023 CLINICAL DATA:  Dizziness and weakness. EXAM: PORTABLE CHEST 1 VIEW COMPARISON:  PA and lateral chest 03/15/2022 FINDINGS: Stable mild cardiomegaly. No vascular congestion is seen. Stable mediastinum with aortic atherosclerosis. The lungs are clear. The sulci are sharp. Degenerative change and slight dextroscoliosis thoracic spine. IMPRESSION: No active disease. Stable chest with cardiomegaly and aortic atherosclerosis. Electronically Signed   By: Almira Bar M.D.   On: 02/09/2023 04:38  PROCEDURES:  Critical Care performed: No  .1-3 Lead EKG Interpretation  Performed by: Irean Hong, MD Authorized by: Irean Hong, MD     Interpretation: abnormal     ECG rate:  95   ECG rate assessment: normal     Rhythm: atrial fibrillation     Ectopy: none     Conduction: normal   Comments:     Patient placed on cardiac monitor to evaluate for arrhythmias    MEDICATIONS ORDERED IN ED: Medications  sodium chloride 0.9 % bolus 500 mL (0 mLs Intravenous Stopped 02/09/23 0542)     IMPRESSION / MDM / ASSESSMENT AND PLAN / ED COURSE  I reviewed the triage vital signs and the nursing notes.                             77 year old male presenting with generalized weakness, dizziness, low blood pressure reading at home.  Frontal diagnosis includes but is not limited to sepsis, ACS, infectious, metabolic etiologies, etc.  I personally reviewed patient's records and note a podiatry office visit from 11/28/2022 for foot ulcer.  Patient's presentation is most consistent with acute presentation with potential threat to life or bodily function.  The patient is on the cardiac monitor to evaluate for evidence of arrhythmia and/or significant heart rate changes.  Will obtain sepsis workup, initiate gentle IV hydration and reassess.  Clinical Course as of 02/09/23 4098  Sat Feb 09, 2023  0604 Resting no acute distress.  Workup thus far unremarkable, pending repeat troponin.  Awaiting urine specimen.  CT head and chest x-ray unremarkable. [JS]  0654 Repeat troponin unremarkable.  Awaiting urine specimen.  Remains normotensive.  Anticipate patient may be discharged home after urine results.  Care will be transferred to the oncoming provider at change of shift. [JS]    Clinical Course User Index [JS] Irean Hong, MD     FINAL CLINICAL IMPRESSION(S) / ED DIAGNOSES   Final diagnoses:  Generalized weakness  Dizziness     Rx / DC Orders    ED Discharge Orders     None        Note:  This document was prepared using Dragon voice recognition software and may include unintentional dictation errors.   Irean Hong, MD 02/09/23 [phone]

## 2023-02-09 NOTE — ED Triage Notes (Signed)
Pt reports bp of 75/47 at home. Reports was feeling weak and dizzy so he checked at home. Pt unsure if home cuff is accurate. Pt alert and oriented. Breathing unlabored. Ambulatory to triage and placed in wheelchair. Pt denies chest pain or pressure. Pt states "I just want someone to check my blood pressure to make sure mine is right."

## 2023-02-09 NOTE — Discharge Instructions (Signed)
Drink plenty of fluids daily.  Return to the ER for worsening symptoms, persistent vomiting, difficulty breathing or other concerns. °

## 2023-02-10 LAB — URINE CULTURE: Culture: NO GROWTH

## 2023-02-14 LAB — CULTURE, BLOOD (ROUTINE X 2)
Culture: NO GROWTH
Culture: NO GROWTH
Special Requests: ADEQUATE
Special Requests: ADEQUATE

## 2023-04-29 ENCOUNTER — Other Ambulatory Visit: Payer: Self-pay

## 2023-04-29 ENCOUNTER — Encounter: Payer: Self-pay | Admitting: Internal Medicine

## 2023-04-29 ENCOUNTER — Encounter
Admission: RE | Disposition: A | Discharge status: Home / Self Care | Payer: Self-pay | Source: Home / Self Care | Attending: Internal Medicine

## 2023-04-29 ENCOUNTER — Ambulatory Visit
Admission: RE | Admit: 2023-04-29 | Discharge: 2023-04-29 | Disposition: A | Discharge status: Home / Self Care | Payer: Federal, State, Local not specified - PPO | Attending: Internal Medicine | Admitting: Internal Medicine

## 2023-04-29 DIAGNOSIS — I428 Other cardiomyopathies: Secondary | ICD-10-CM | POA: Insufficient documentation

## 2023-04-29 DIAGNOSIS — I5022 Chronic systolic (congestive) heart failure: Secondary | ICD-10-CM

## 2023-04-29 DIAGNOSIS — I200 Unstable angina: Secondary | ICD-10-CM

## 2023-04-29 DIAGNOSIS — I509 Heart failure, unspecified: Secondary | ICD-10-CM | POA: Insufficient documentation

## 2023-04-29 DIAGNOSIS — I272 Pulmonary hypertension, unspecified: Secondary | ICD-10-CM | POA: Insufficient documentation

## 2023-04-29 DIAGNOSIS — R0602 Shortness of breath: Secondary | ICD-10-CM | POA: Diagnosis present

## 2023-04-29 HISTORY — PX: RIGHT/LEFT HEART CATH AND CORONARY ANGIOGRAPHY: CATH118266

## 2023-04-29 LAB — POCT I-STAT EG7
Acid-base deficit: 3 mmol/L — ABNORMAL HIGH (ref 0.0–2.0)
Bicarbonate: 24.3 mmol/L (ref 20.0–28.0)
Calcium, Ion: 1.14 mmol/L — ABNORMAL LOW (ref 1.15–1.40)
HCT: 39 % (ref 39.0–52.0)
Hemoglobin: 13.3 g/dL (ref 13.0–17.0)
O2 Saturation: 55 %
Potassium: 3.7 mmol/L (ref 3.5–5.1)
Sodium: 129 mmol/L — ABNORMAL LOW (ref 135–145)
TCO2: 26 mmol/L (ref 22–32)
pCO2, Ven: 49.1 mm[Hg] (ref 44–60)
pH, Ven: 7.302 (ref 7.25–7.43)
pO2, Ven: 32 mm[Hg] (ref 32–45)

## 2023-04-29 LAB — GLUCOSE, CAPILLARY: Glucose-Capillary: 203 mg/dL — ABNORMAL HIGH (ref 70–99)

## 2023-04-29 SURGERY — RIGHT/LEFT HEART CATH AND CORONARY ANGIOGRAPHY
Anesthesia: Moderate Sedation | Laterality: Bilateral

## 2023-04-29 MED ORDER — LIDOCAINE HCL (PF) 1 % IJ SOLN
INTRAMUSCULAR | Status: DC | PRN
  Administered 2023-04-29: 2 mL via SUBCUTANEOUS

## 2023-04-29 MED ORDER — MIDAZOLAM HCL 2 MG/2ML IJ SOLN
INTRAMUSCULAR | Status: AC
Start: 2023-04-29 — End: ?
  Filled 2023-04-29: qty 2

## 2023-04-29 MED ORDER — SODIUM CHLORIDE 0.9% FLUSH: 3.0000 mL | Freq: Two times a day (BID) | INTRAVENOUS | Status: DC

## 2023-04-29 MED ORDER — LIDOCAINE HCL 1 % IJ SOLN
INTRAMUSCULAR | Status: AC
  Filled 2023-04-29: qty 20

## 2023-04-29 MED ORDER — SODIUM CHLORIDE 0.9 % IV SOLN
INTRAVENOUS | Status: DC
Start: 2023-04-29 — End: 2023-04-29

## 2023-04-29 MED ORDER — FENTANYL CITRATE (PF) 100 MCG/2ML IJ SOLN
INTRAMUSCULAR | Status: AC
  Filled 2023-04-29: qty 2

## 2023-04-29 MED ORDER — VERAPAMIL HCL 2.5 MG/ML IV SOLN
INTRAVENOUS | Status: DC | PRN
  Administered 2023-04-29: 2.5 mg via INTRAVENOUS

## 2023-04-29 MED ORDER — HEPARIN SODIUM (PORCINE) 1000 UNIT/ML IJ SOLN
INTRAMUSCULAR | Status: DC | PRN
  Administered 2023-04-29: 5000 [IU] via INTRAVENOUS

## 2023-04-29 MED ORDER — SODIUM CHLORIDE 0.9 % IV SOLN: 250.0000 mL | INTRAVENOUS | Status: DC | PRN

## 2023-04-29 MED ORDER — ASPIRIN 81 MG PO CHEW: 81.0000 mg | CHEWABLE_TABLET | ORAL | Status: AC

## 2023-04-29 MED ORDER — ASPIRIN 81 MG PO CHEW
CHEWABLE_TABLET | ORAL | Status: AC
  Administered 2023-04-29: 81 mg via ORAL
  Filled 2023-04-29: qty 1

## 2023-04-29 MED ORDER — MIDAZOLAM HCL 2 MG/2ML IJ SOLN
INTRAMUSCULAR | Status: DC | PRN
  Administered 2023-04-29: 1 mg via INTRAVENOUS

## 2023-04-29 MED ORDER — FENTANYL CITRATE (PF) 100 MCG/2ML IJ SOLN
INTRAMUSCULAR | Status: DC | PRN
  Administered 2023-04-29: 25 ug via INTRAVENOUS

## 2023-04-29 MED ORDER — HEPARIN (PORCINE) IN NACL 2000-0.9 UNIT/L-% IV SOLN
INTRAVENOUS | Status: DC | PRN
  Administered 2023-04-29: 1000 mL

## 2023-04-29 MED ORDER — HEPARIN (PORCINE) IN NACL 1000-0.9 UT/500ML-% IV SOLN
INTRAVENOUS | Status: AC
  Filled 2023-04-29: qty 1000

## 2023-04-29 MED ORDER — IOHEXOL 300 MG/ML  SOLN
INTRAMUSCULAR | Status: DC | PRN
  Administered 2023-04-29: 48 mL

## 2023-04-29 MED ORDER — VERAPAMIL HCL 2.5 MG/ML IV SOLN
INTRAVENOUS | Status: AC
  Filled 2023-04-29: qty 2

## 2023-04-29 MED ORDER — SODIUM CHLORIDE 0.9% FLUSH: 3.0000 mL | INTRAVENOUS | Status: DC | PRN

## 2023-04-29 MED ORDER — HEPARIN SODIUM (PORCINE) 1000 UNIT/ML IJ SOLN
INTRAMUSCULAR | Status: AC
  Filled 2023-04-29: qty 10

## 2023-04-29 SURGICAL SUPPLY — 14 items
CATH 5FR JL3.5 JR4 ANG PIG MP (CATHETERS) IMPLANT
CATH BALLN WEDGE 5F 110CM (CATHETERS) IMPLANT
DEVICE RAD TR BAND REGULAR (VASCULAR PRODUCTS) IMPLANT
DRAPE BRACHIAL (DRAPES) IMPLANT
GLIDESHEATH SLEND SS 6F .021 (SHEATH) IMPLANT
GUIDEWIRE .025 260CM (WIRE) IMPLANT
GUIDEWIRE EMER 3M J .025X150CM (WIRE) IMPLANT
GUIDEWIRE INQWIRE 1.5J.035X260 (WIRE) IMPLANT
INQWIRE 1.5J .035X260CM (WIRE) ×1
PACK CARDIAC CATH (CUSTOM PROCEDURE TRAY) ×1 IMPLANT
PROTECTION STATION PRESSURIZED (MISCELLANEOUS) ×1
SET ATX-X65L (MISCELLANEOUS) IMPLANT
SHEATH GLIDE SLENDER 4/5FR (SHEATH) IMPLANT
STATION PROTECTION PRESSURIZED (MISCELLANEOUS) IMPLANT

## 2023-04-29 NOTE — Progress Notes (Signed)
Dr. Juliann Pares and Domingo Mend, PA-C messaged and alerted to pt's afib and rate going up to 140's at bedrest.  No new orders.

## 2023-04-30 LAB — POCT I-STAT 7, (LYTES, BLD GAS, ICA,H+H)
Acid-base deficit: 1 mmol/L (ref 0.0–2.0)
Bicarbonate: 23.1 mmol/L (ref 20.0–28.0)
Calcium, Ion: 1.2 mmol/L (ref 1.15–1.40)
HCT: 40 % (ref 39.0–52.0)
Hemoglobin: 13.6 g/dL (ref 13.0–17.0)
O2 Saturation: 97 %
Potassium: 4 mmol/L (ref 3.5–5.1)
Sodium: 134 mmol/L — ABNORMAL LOW (ref 135–145)
TCO2: 24 mmol/L (ref 22–32)
pCO2 arterial: 36 mm[Hg] (ref 32–48)
pH, Arterial: 7.415 (ref 7.35–7.45)
pO2, Arterial: 93 mm[Hg] (ref 83–108)

## 2023-05-02 LAB — CARDIAC CATHETERIZATION: Cath EF Quantitative: 25 %

## 2023-05-03 ENCOUNTER — Encounter: Payer: Self-pay | Admitting: Internal Medicine

## 2023-09-19 IMAGING — CR DG CHEST 1V PORT
1 series · 1 of 1 positions shown · non-contrast
Comparison: 02/26/2017

CLINICAL DATA: Reason for exam: trauma, pain and swelling, eval for
free air Per nurse notes, "C/O infection to right knee. Patient
states redness has improved, now noting some leaking from center of
knee. -- knee is pink, warm to touch

EXAM:
PORTABLE CHEST - 1 VIEW

[chest ap]
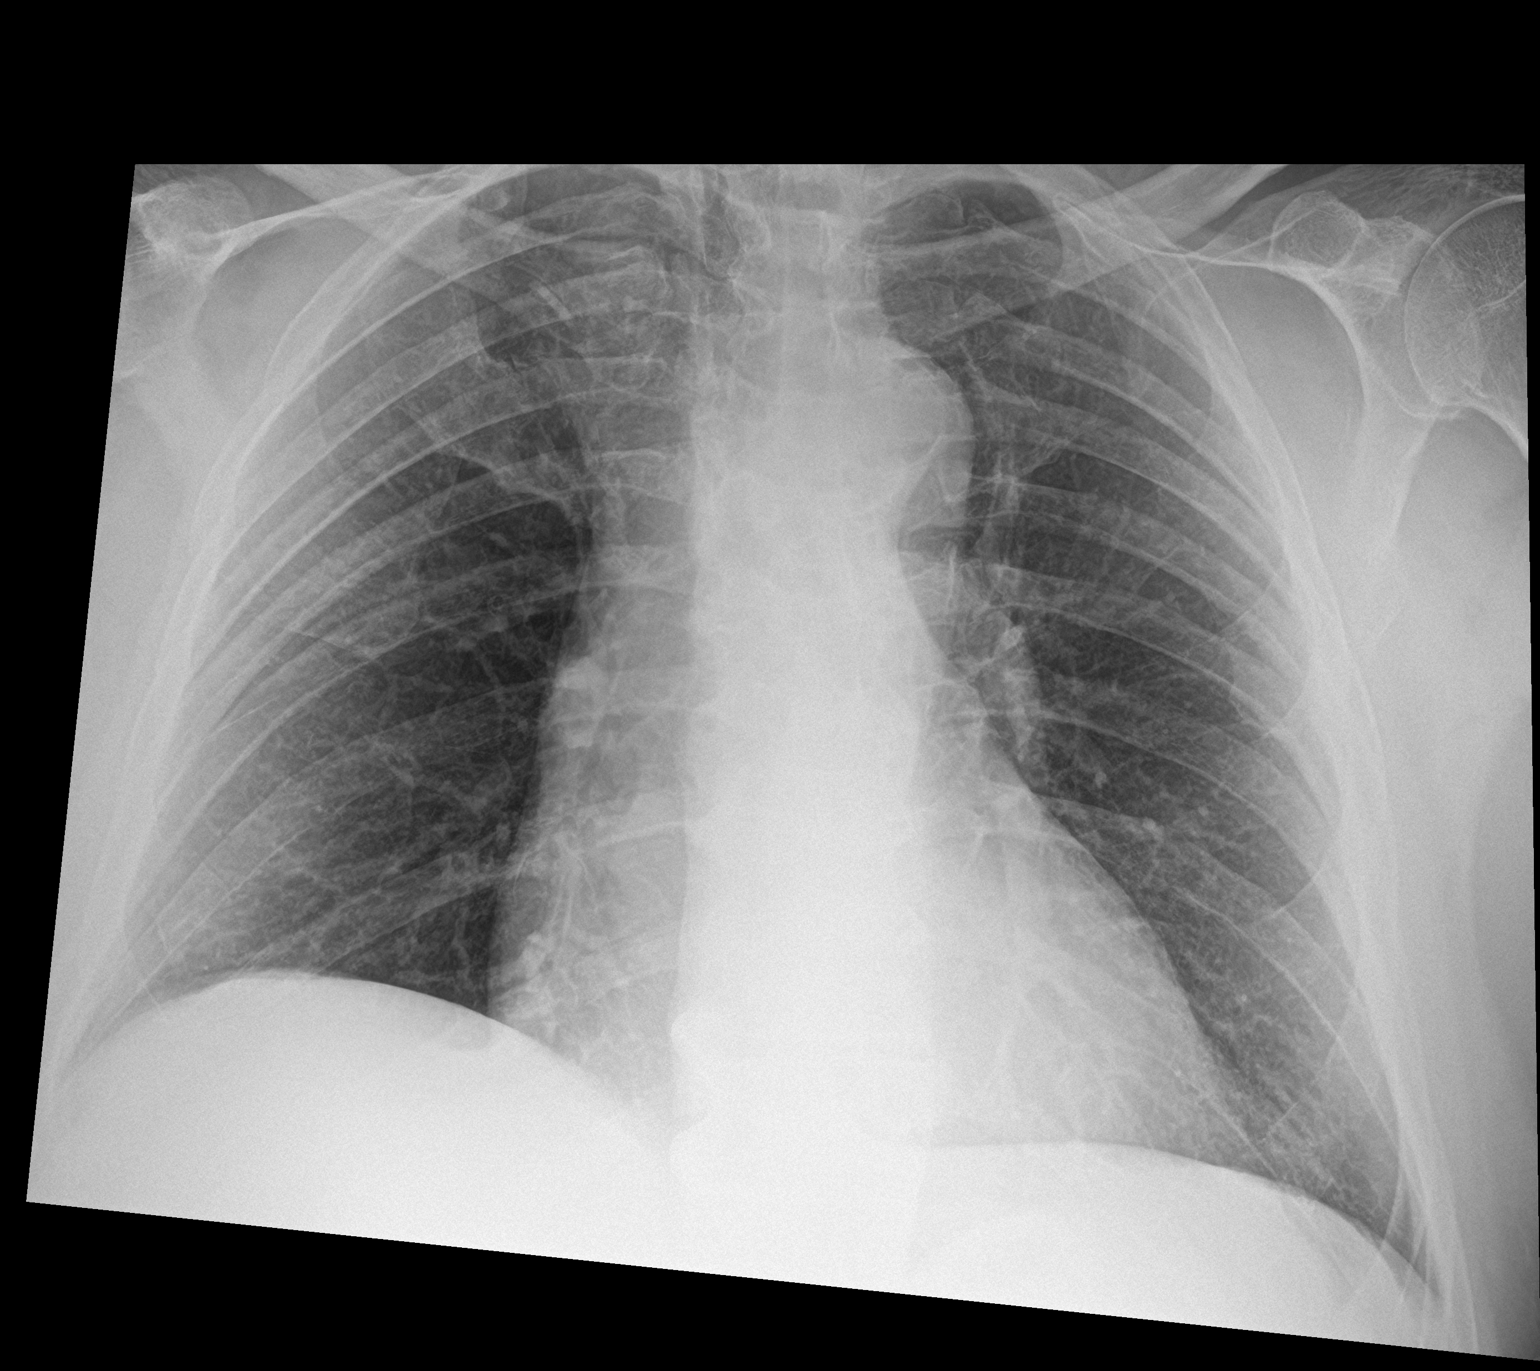

[1 of 1 positions shown; findings below may reference images not displayed]

FINDINGS: Lungs are clear.

Heart size and mediastinal contours are within normal limits. Aortic
Atherosclerosis (OCF29-170.0).

No effusion.  No pneumothorax.

Visualized bones unremarkable.
IMPRESSION: No acute cardiopulmonary disease.

## 2023-09-19 IMAGING — CR DG TIBIA/FIBULA 2V*R*
4 series · 4 of 4 positions shown · non-contrast
Comparison: None.

CLINICAL DATA: 75-year-old male with right knee infection.

EXAM:
RIGHT KNEE - 1-2 VIEW; RIGHT TIBIA AND FIBULA - 2 VIEW

[tibia ap (1 of 2)]
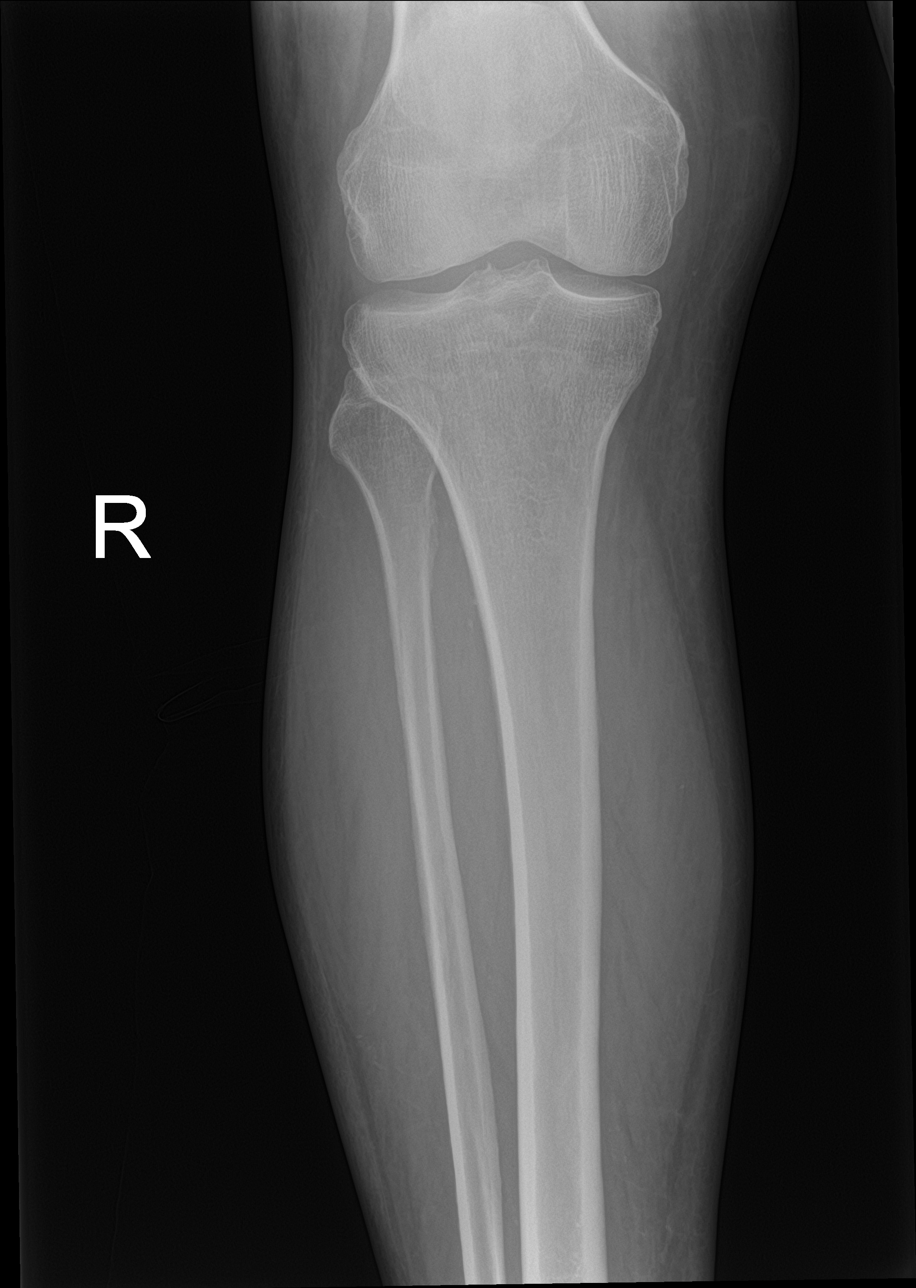

[tibia ap (2 of 2)]
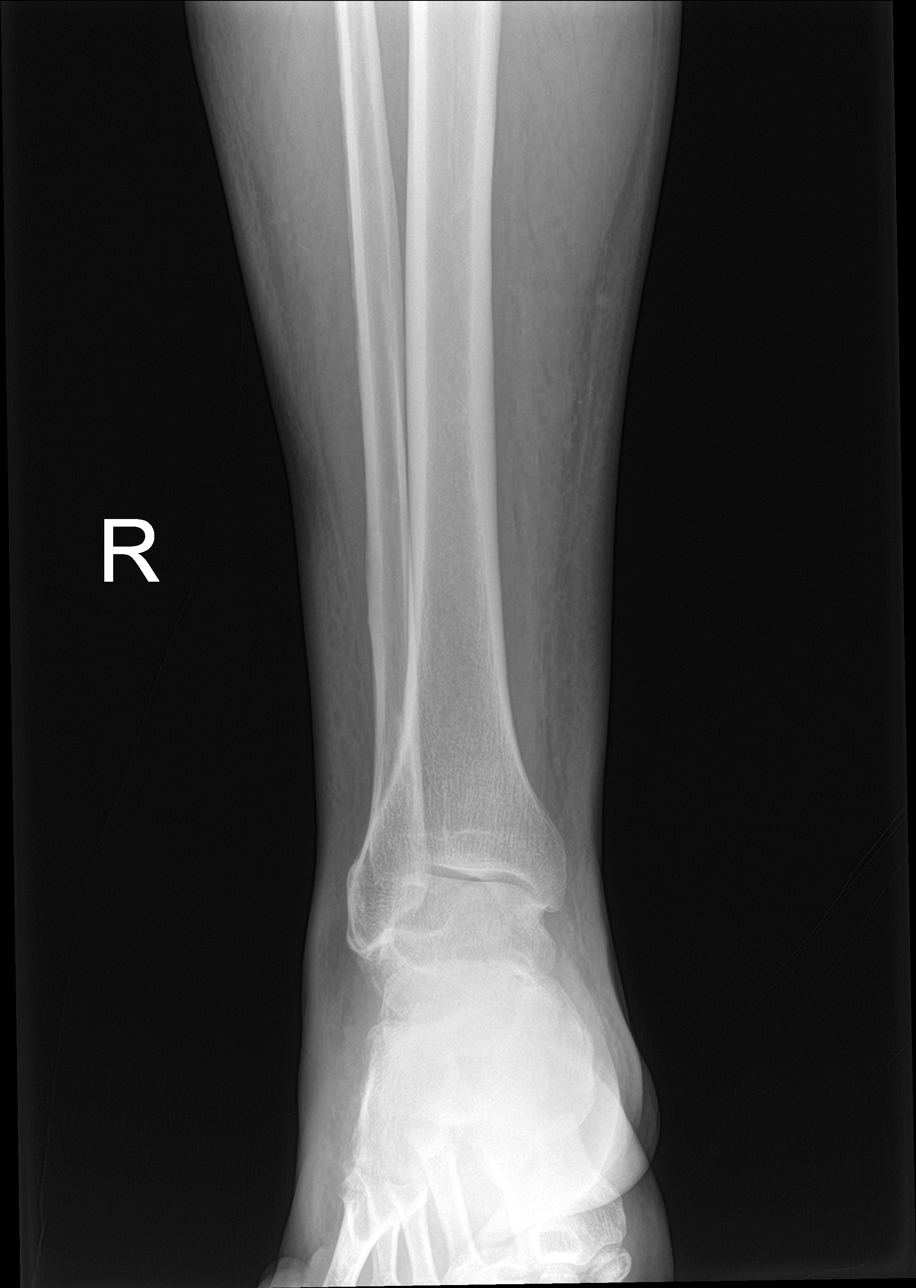

[tibia lat (1 of 2)]
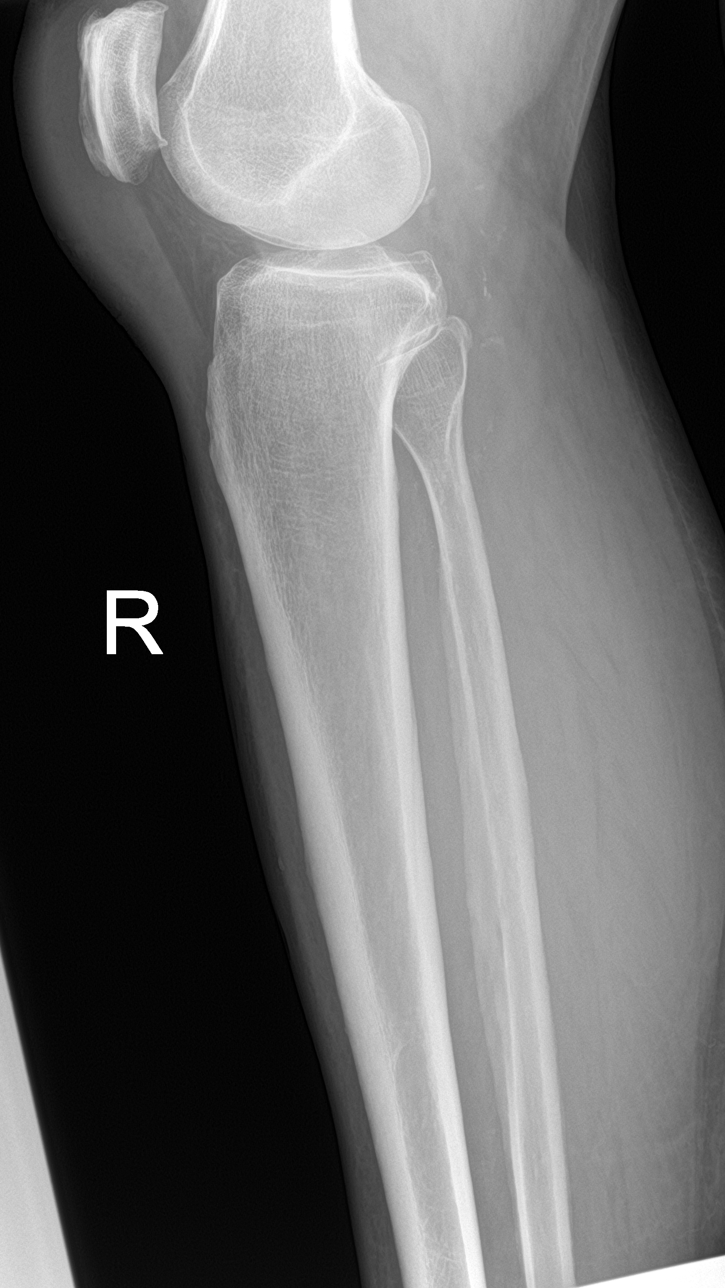

[tibia lat (2 of 2)]
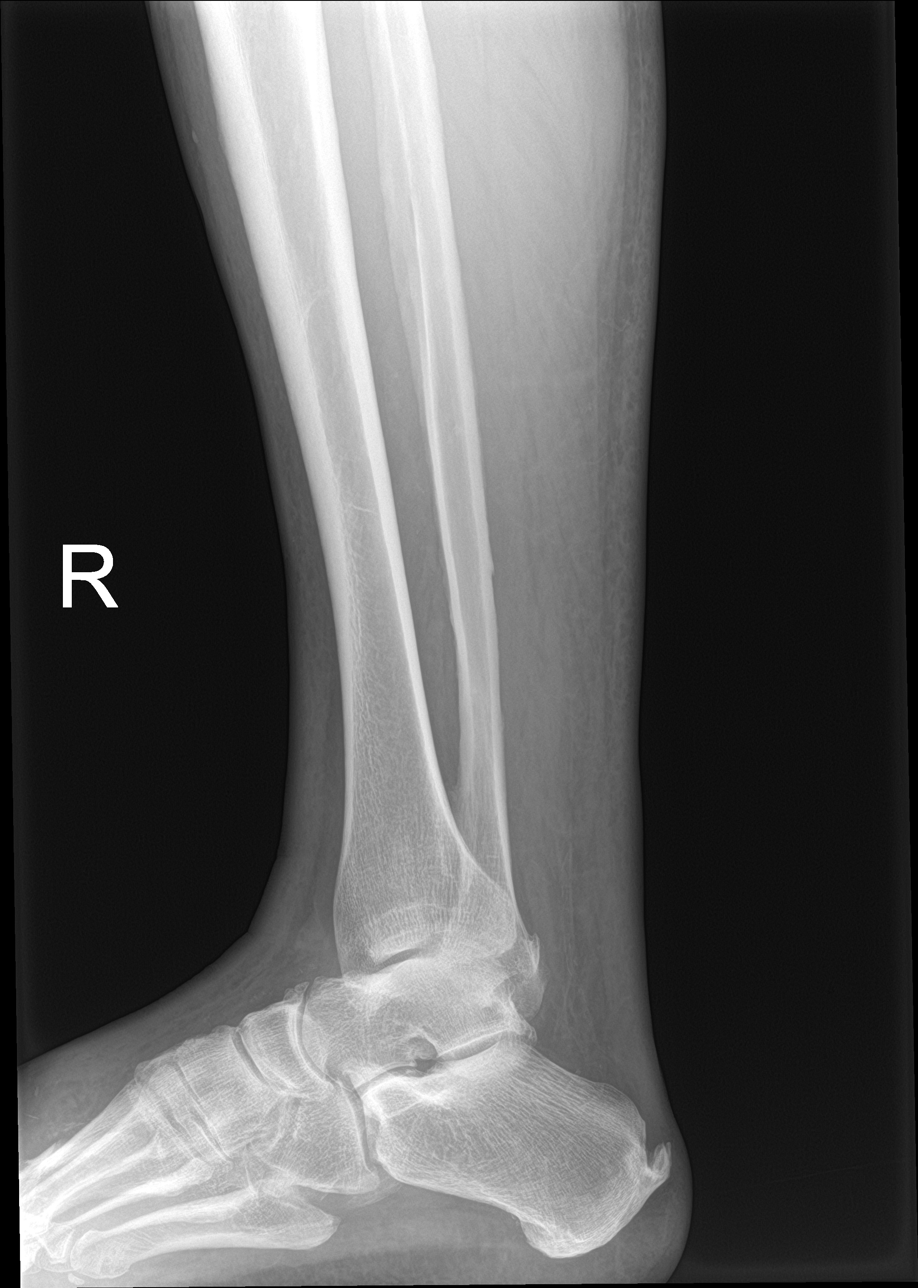

[4 of 4 positions shown; findings below may reference images not displayed]

FINDINGS: Right knee:

No evidence of fracture, dislocation, or joint effusion. Mild medial
compartment joint space narrowing. Mild periarticular osteophyte
formation, most prominent about the patella. Prepatellar soft tissue
prominence. No evidence of subcutaneous emphysema or radiopaque
foreign body.

Right tibia-fibula:

No evidence of fracture, dislocation, or joint effusion. Achilles
calcaneal enthesopathy. Scattered midfoot degenerative changes. Soft
tissues are within normal limits.
IMPRESSION: 1. Prepatellar soft tissue prominence as could be seen with bursitis
or soft tissue skin infection. No evidence of significant
subcutaneous emphysema or osseous involvement.
2. Mild tricompartmental degenerative changes of the knee.

## 2023-10-14 ENCOUNTER — Other Ambulatory Visit
Admission: RE | Admit: 2023-10-14 | Discharge: 2023-10-14 | Disposition: A | Discharge status: Home / Self Care | Source: Ambulatory Visit | Attending: Pulmonary Disease | Admitting: Pulmonary Disease

## 2023-10-14 DIAGNOSIS — I5022 Chronic systolic (congestive) heart failure: Secondary | ICD-10-CM | POA: Diagnosis present

## 2023-10-14 LAB — D-DIMER, QUANTITATIVE: D-Dimer, Quant: 0.46 ug{FEU}/mL (ref 0.00–0.50)

## 2023-10-16 ENCOUNTER — Other Ambulatory Visit: Payer: Self-pay | Admitting: Pulmonary Disease

## 2023-10-16 DIAGNOSIS — I5022 Chronic systolic (congestive) heart failure: Secondary | ICD-10-CM

## 2023-10-17 ENCOUNTER — Ambulatory Visit: Admission: RE | Admit: 2023-10-17 | Source: Ambulatory Visit

## 2023-10-28 ENCOUNTER — Inpatient Hospital Stay
Admission: EM | Admit: 2023-10-28 | Discharge: 2023-11-05 | DRG: 291 | Disposition: A | Discharge status: Hospice | Attending: Internal Medicine | Admitting: Internal Medicine

## 2023-10-28 ENCOUNTER — Other Ambulatory Visit: Payer: Self-pay

## 2023-10-28 ENCOUNTER — Encounter: Payer: Self-pay | Admitting: Intensive Care

## 2023-10-28 ENCOUNTER — Observation Stay

## 2023-10-28 DIAGNOSIS — J449 Chronic obstructive pulmonary disease, unspecified: Secondary | ICD-10-CM | POA: Diagnosis not present

## 2023-10-28 DIAGNOSIS — N529 Male erectile dysfunction, unspecified: Secondary | ICD-10-CM | POA: Diagnosis present

## 2023-10-28 DIAGNOSIS — E785 Hyperlipidemia, unspecified: Secondary | ICD-10-CM | POA: Diagnosis present

## 2023-10-28 DIAGNOSIS — F418 Other specified anxiety disorders: Secondary | ICD-10-CM | POA: Diagnosis not present

## 2023-10-28 DIAGNOSIS — N170 Acute kidney failure with tubular necrosis: Secondary | ICD-10-CM | POA: Diagnosis not present

## 2023-10-28 DIAGNOSIS — I5022 Chronic systolic (congestive) heart failure: Secondary | ICD-10-CM | POA: Diagnosis not present

## 2023-10-28 DIAGNOSIS — E875 Hyperkalemia: Secondary | ICD-10-CM | POA: Diagnosis present

## 2023-10-28 DIAGNOSIS — N182 Chronic kidney disease, stage 2 (mild): Secondary | ICD-10-CM | POA: Diagnosis present

## 2023-10-28 DIAGNOSIS — I5023 Acute on chronic systolic (congestive) heart failure: Secondary | ICD-10-CM | POA: Diagnosis present

## 2023-10-28 DIAGNOSIS — I428 Other cardiomyopathies: Secondary | ICD-10-CM | POA: Diagnosis present

## 2023-10-28 DIAGNOSIS — I4891 Unspecified atrial fibrillation: Secondary | ICD-10-CM | POA: Diagnosis not present

## 2023-10-28 DIAGNOSIS — Z91119 Patient's noncompliance with dietary regimen due to unspecified reason: Secondary | ICD-10-CM

## 2023-10-28 DIAGNOSIS — Z79899 Other long term (current) drug therapy: Secondary | ICD-10-CM

## 2023-10-28 DIAGNOSIS — Z5986 Financial insecurity: Secondary | ICD-10-CM

## 2023-10-28 DIAGNOSIS — R570 Cardiogenic shock: Secondary | ICD-10-CM | POA: Diagnosis not present

## 2023-10-28 DIAGNOSIS — I130 Hypertensive heart and chronic kidney disease with heart failure and stage 1 through stage 4 chronic kidney disease, or unspecified chronic kidney disease: Secondary | ICD-10-CM | POA: Diagnosis not present

## 2023-10-28 DIAGNOSIS — Z66 Do not resuscitate: Secondary | ICD-10-CM | POA: Diagnosis present

## 2023-10-28 DIAGNOSIS — I10 Essential (primary) hypertension: Secondary | ICD-10-CM | POA: Diagnosis present

## 2023-10-28 DIAGNOSIS — G43909 Migraine, unspecified, not intractable, without status migrainosus: Secondary | ICD-10-CM | POA: Diagnosis not present

## 2023-10-28 DIAGNOSIS — Z7901 Long term (current) use of anticoagulants: Secondary | ICD-10-CM

## 2023-10-28 DIAGNOSIS — G928 Other toxic encephalopathy: Secondary | ICD-10-CM | POA: Diagnosis not present

## 2023-10-28 DIAGNOSIS — I5084 End stage heart failure: Secondary | ICD-10-CM | POA: Diagnosis present

## 2023-10-28 DIAGNOSIS — E669 Obesity, unspecified: Secondary | ICD-10-CM | POA: Diagnosis present

## 2023-10-28 DIAGNOSIS — N179 Acute kidney failure, unspecified: Secondary | ICD-10-CM | POA: Diagnosis present

## 2023-10-28 DIAGNOSIS — F0631 Mood disorder due to known physiological condition with depressive features: Secondary | ICD-10-CM | POA: Diagnosis present

## 2023-10-28 DIAGNOSIS — E876 Hypokalemia: Secondary | ICD-10-CM | POA: Diagnosis present

## 2023-10-28 DIAGNOSIS — W19XXXA Unspecified fall, initial encounter: Secondary | ICD-10-CM | POA: Diagnosis present

## 2023-10-28 DIAGNOSIS — N401 Enlarged prostate with lower urinary tract symptoms: Secondary | ICD-10-CM | POA: Diagnosis present

## 2023-10-28 DIAGNOSIS — I639 Cerebral infarction, unspecified: Secondary | ICD-10-CM | POA: Diagnosis present

## 2023-10-28 DIAGNOSIS — E1165 Type 2 diabetes mellitus with hyperglycemia: Principal | ICD-10-CM | POA: Diagnosis present

## 2023-10-28 DIAGNOSIS — E1122 Type 2 diabetes mellitus with diabetic chronic kidney disease: Secondary | ICD-10-CM | POA: Diagnosis present

## 2023-10-28 DIAGNOSIS — Z720 Tobacco use: Secondary | ICD-10-CM

## 2023-10-28 DIAGNOSIS — G4733 Obstructive sleep apnea (adult) (pediatric): Secondary | ICD-10-CM | POA: Diagnosis present

## 2023-10-28 DIAGNOSIS — F32A Depression, unspecified: Secondary | ICD-10-CM | POA: Diagnosis present

## 2023-10-28 DIAGNOSIS — J9601 Acute respiratory failure with hypoxia: Secondary | ICD-10-CM | POA: Diagnosis not present

## 2023-10-28 DIAGNOSIS — E559 Vitamin D deficiency, unspecified: Secondary | ICD-10-CM | POA: Diagnosis present

## 2023-10-28 DIAGNOSIS — F419 Anxiety disorder, unspecified: Secondary | ICD-10-CM | POA: Diagnosis present

## 2023-10-28 DIAGNOSIS — Z85820 Personal history of malignant melanoma of skin: Secondary | ICD-10-CM

## 2023-10-28 DIAGNOSIS — Z6831 Body mass index (BMI) 31.0-31.9, adult: Secondary | ICD-10-CM

## 2023-10-28 DIAGNOSIS — Z5941 Food insecurity: Secondary | ICD-10-CM

## 2023-10-28 DIAGNOSIS — Z515 Encounter for palliative care: Secondary | ICD-10-CM

## 2023-10-28 DIAGNOSIS — Y92009 Unspecified place in unspecified non-institutional (private) residence as the place of occurrence of the external cause: Secondary | ICD-10-CM

## 2023-10-28 DIAGNOSIS — Z91148 Patient's other noncompliance with medication regimen for other reason: Secondary | ICD-10-CM

## 2023-10-28 DIAGNOSIS — Z8673 Personal history of transient ischemic attack (TIA), and cerebral infarction without residual deficits: Secondary | ICD-10-CM

## 2023-10-28 DIAGNOSIS — E66811 Obesity, class 1: Secondary | ICD-10-CM | POA: Diagnosis present

## 2023-10-28 DIAGNOSIS — K761 Chronic passive congestion of liver: Secondary | ICD-10-CM | POA: Diagnosis present

## 2023-10-28 DIAGNOSIS — Z794 Long term (current) use of insulin: Secondary | ICD-10-CM

## 2023-10-28 DIAGNOSIS — E1129 Type 2 diabetes mellitus with other diabetic kidney complication: Secondary | ICD-10-CM | POA: Diagnosis present

## 2023-10-28 DIAGNOSIS — T501X6A Underdosing of loop [high-ceiling] diuretics, initial encounter: Secondary | ICD-10-CM | POA: Diagnosis present

## 2023-10-28 DIAGNOSIS — I4821 Permanent atrial fibrillation: Secondary | ICD-10-CM | POA: Diagnosis present

## 2023-10-28 LAB — CBC
HCT: 42.6 % (ref 39.0–52.0)
Hemoglobin: 14.6 g/dL (ref 13.0–17.0)
MCH: 29.4 pg (ref 26.0–34.0)
MCHC: 34.3 g/dL (ref 30.0–36.0)
MCV: 85.9 fL (ref 80.0–100.0)
Platelets: 221 10*3/uL (ref 150–400)
RBC: 4.96 MIL/uL (ref 4.22–5.81)
RDW: 12.9 % (ref 11.5–15.5)
WBC: 8.6 10*3/uL (ref 4.0–10.5)
nRBC: 0 % (ref 0.0–0.2)

## 2023-10-28 LAB — URINALYSIS, ROUTINE W REFLEX MICROSCOPIC
Bacteria, UA: NONE SEEN
Bilirubin Urine: NEGATIVE
Glucose, UA: >=500 mg/dL — AB
Hgb urine dipstick: NEGATIVE
Ketones, ur: NEGATIVE mg/dL
Leukocytes,Ua: NEGATIVE
Nitrite: NEGATIVE
Protein, ur: NEGATIVE mg/dL
Specific Gravity, Urine: 1.03 (ref 1.005–1.030)
Squamous Epithelial / HPF: 0 /HPF (ref 0–5)
WBC, UA: 0 WBC/hpf (ref 0–5)
pH: 5 (ref 5.0–8.0)

## 2023-10-28 LAB — BASIC METABOLIC PANEL WITH GFR
Anion gap: 11 (ref 5–15)
BUN: 34 mg/dL — ABNORMAL HIGH (ref 8–23)
CO2: 20 mmol/L — ABNORMAL LOW (ref 22–32)
Calcium: 8.7 mg/dL — ABNORMAL LOW (ref 8.9–10.3)
Chloride: 100 mmol/L (ref 98–111)
Creatinine, Ser: 1.3 mg/dL — ABNORMAL HIGH (ref 0.61–1.24)
GFR, Estimated: 57 mL/min — ABNORMAL LOW (ref >60)
Glucose, Bld: 543 mg/dL (ref 70–99)
Potassium: 4.8 mmol/L (ref 3.5–5.1)
Sodium: 131 mmol/L — ABNORMAL LOW (ref 135–145)

## 2023-10-28 LAB — CBG MONITORING, ED: Glucose-Capillary: 574 mg/dL (ref 70–99)

## 2023-10-28 LAB — BRAIN NATRIURETIC PEPTIDE: B Natriuretic Peptide: 987.7 pg/mL — ABNORMAL HIGH (ref 0.0–100.0)

## 2023-10-28 LAB — GLUCOSE, CAPILLARY: Glucose-Capillary: 379 mg/dL — ABNORMAL HIGH (ref 70–99)

## 2023-10-28 MED ORDER — ALBUTEROL SULFATE (2.5 MG/3ML) 0.083% IN NEBU: 2.5000 mg | INHALATION_SOLUTION | RESPIRATORY_TRACT | Status: DC | PRN

## 2023-10-28 MED ORDER — FENOFIBRATE 160 MG PO TABS
160.0000 mg | ORAL_TABLET | Freq: Every day | ORAL | Status: DC
  Administered 2023-10-29 – 2023-11-05 (×8): 160 mg via ORAL
  Filled 2023-10-28 (×8): qty 1

## 2023-10-28 MED ORDER — ONDANSETRON HCL 4 MG/2ML IJ SOLN
INTRAMUSCULAR | Status: AC
  Filled 2023-10-28: qty 2

## 2023-10-28 MED ORDER — INSULIN ASPART 100 UNIT/ML IJ SOLN
0.0000 [IU] | Freq: Every day | INTRAMUSCULAR | Status: DC
  Administered 2023-10-28: 5 [IU] via SUBCUTANEOUS
  Administered 2023-10-29 – 2023-11-01 (×3): 2 [IU] via SUBCUTANEOUS
  Administered 2023-11-02 – 2023-11-03 (×2): 3 [IU] via SUBCUTANEOUS
  Administered 2023-11-04: 4 [IU] via SUBCUTANEOUS
  Filled 2023-10-28 (×2): qty 2
  Filled 2023-10-28: qty 4
  Filled 2023-10-28: qty 3
  Filled 2023-10-28: qty 1
  Filled 2023-10-28: qty 3
  Filled 2023-10-28: qty 1

## 2023-10-28 MED ORDER — ROPINIROLE HCL ER 8 MG PO TB24
12.0000 mg | ORAL_TABLET | Freq: Every day | ORAL | Status: DC
  Administered 2023-10-29 (×2): 12 mg via ORAL
  Filled 2023-10-28 (×2): qty 3

## 2023-10-28 MED ORDER — AMIODARONE HCL 200 MG PO TABS
200.0000 mg | ORAL_TABLET | Freq: Every day | ORAL | Status: DC
  Administered 2023-10-28 – 2023-10-30 (×3): 200 mg via ORAL
  Filled 2023-10-28 (×3): qty 1

## 2023-10-28 MED ORDER — SODIUM CHLORIDE 0.9 % IV BOLUS: 500.0000 mL | Freq: Once | INTRAVENOUS | Status: DC

## 2023-10-28 MED ORDER — ONDANSETRON HCL 4 MG/2ML IJ SOLN
4.0000 mg | Freq: Once | INTRAMUSCULAR | Status: AC
  Administered 2023-10-28: 4 mg via INTRAVENOUS

## 2023-10-28 MED ORDER — INSULIN GLARGINE-YFGN 100 UNIT/ML ~~LOC~~ SOLN
45.0000 [IU] | Freq: Every day | SUBCUTANEOUS | Status: DC
  Administered 2023-10-28 – 2023-10-30 (×3): 45 [IU] via SUBCUTANEOUS
  Filled 2023-10-28 (×3): qty 0.45

## 2023-10-28 MED ORDER — ROSUVASTATIN CALCIUM 10 MG PO TABS
5.0000 mg | ORAL_TABLET | Freq: Every day | ORAL | Status: DC
  Administered 2023-10-29 – 2023-11-05 (×8): 5 mg via ORAL
  Filled 2023-10-28 (×8): qty 1

## 2023-10-28 MED ORDER — DILTIAZEM HCL-DEXTROSE 125-5 MG/125ML-% IV SOLN (PREMIX): 5.0000 mg/h | INTRAVENOUS | Status: DC

## 2023-10-28 MED ORDER — ENOXAPARIN SODIUM 60 MG/0.6ML IJ SOSY: 50.0000 mg | PREFILLED_SYRINGE | INTRAMUSCULAR | Status: DC

## 2023-10-28 MED ORDER — APIXABAN 5 MG PO TABS
5.0000 mg | ORAL_TABLET | Freq: Two times a day (BID) | ORAL | Status: DC
  Administered 2023-10-28 – 2023-11-05 (×16): 5 mg via ORAL
  Filled 2023-10-28 (×16): qty 1

## 2023-10-28 MED ORDER — LORAZEPAM 0.5 MG PO TABS
0.5000 mg | ORAL_TABLET | Freq: Every day | ORAL | Status: DC | PRN
  Administered 2023-10-28: 1 mg via ORAL
  Administered 2023-10-29: 0.5 mg via ORAL
  Filled 2023-10-28 (×2): qty 2

## 2023-10-28 MED ORDER — DM-GUAIFENESIN ER 30-600 MG PO TB12
1.0000 | ORAL_TABLET | Freq: Two times a day (BID) | ORAL | Status: DC | PRN
  Administered 2023-10-29 – 2023-10-30 (×3): 1 via ORAL
  Filled 2023-10-28 (×4): qty 1

## 2023-10-28 MED ORDER — PAROXETINE HCL 20 MG PO TABS
20.0000 mg | ORAL_TABLET | Freq: Every day | ORAL | Status: DC
  Administered 2023-10-29: 20 mg via ORAL
  Filled 2023-10-28: qty 1

## 2023-10-28 MED ORDER — INSULIN ASPART 100 UNIT/ML IJ SOLN
0.0000 [IU] | Freq: Three times a day (TID) | INTRAMUSCULAR | Status: DC
  Administered 2023-10-29: 3 [IU] via SUBCUTANEOUS
  Administered 2023-10-29: 9 [IU] via SUBCUTANEOUS
  Administered 2023-10-29: 7 [IU] via SUBCUTANEOUS
  Administered 2023-10-30 (×2): 3 [IU] via SUBCUTANEOUS
  Administered 2023-10-30: 2 [IU] via SUBCUTANEOUS
  Administered 2023-10-31: 5 [IU] via SUBCUTANEOUS
  Administered 2023-10-31 – 2023-11-01 (×3): 2 [IU] via SUBCUTANEOUS
  Administered 2023-11-01: 3 [IU] via SUBCUTANEOUS
  Administered 2023-11-02: 5 [IU] via SUBCUTANEOUS
  Administered 2023-11-02 – 2023-11-03 (×3): 2 [IU] via SUBCUTANEOUS
  Administered 2023-11-03: 7 [IU] via SUBCUTANEOUS
  Administered 2023-11-03: 5 [IU] via SUBCUTANEOUS
  Administered 2023-11-04: 2 [IU] via SUBCUTANEOUS
  Administered 2023-11-04: 3 [IU] via SUBCUTANEOUS
  Administered 2023-11-04: 7 [IU] via SUBCUTANEOUS
  Administered 2023-11-05: 5 [IU] via SUBCUTANEOUS
  Administered 2023-11-05: 1 [IU] via SUBCUTANEOUS
  Filled 2023-10-28 (×4): qty 2
  Filled 2023-10-28: qty 7
  Filled 2023-10-28 (×2): qty 2
  Filled 2023-10-28: qty 7
  Filled 2023-10-28 (×4): qty 1
  Filled 2023-10-28: qty 3
  Filled 2023-10-28: qty 5
  Filled 2023-10-28: qty 2
  Filled 2023-10-28: qty 5
  Filled 2023-10-28: qty 1
  Filled 2023-10-28 (×2): qty 5
  Filled 2023-10-28: qty 1
  Filled 2023-10-28: qty 2
  Filled 2023-10-28: qty 3

## 2023-10-28 MED ORDER — SODIUM CHLORIDE 0.9 % IV BOLUS
1000.0000 mL | Freq: Once | INTRAVENOUS | Status: AC
  Administered 2023-10-28: 1000 mL via INTRAVENOUS

## 2023-10-28 MED ORDER — ACETAMINOPHEN 325 MG PO TABS
650.0000 mg | ORAL_TABLET | Freq: Four times a day (QID) | ORAL | Status: DC | PRN
  Administered 2023-10-28: 650 mg via ORAL
  Filled 2023-10-28: qty 2

## 2023-10-28 MED ORDER — ONDANSETRON HCL 4 MG/2ML IJ SOLN: 4.0000 mg | Freq: Three times a day (TID) | INTRAMUSCULAR | Status: DC | PRN

## 2023-10-28 MED ORDER — CARVEDILOL 25 MG PO TABS
25.0000 mg | ORAL_TABLET | Freq: Two times a day (BID) | ORAL | Status: DC
  Administered 2023-10-29 – 2023-10-30 (×3): 25 mg via ORAL
  Filled 2023-10-28 (×3): qty 1

## 2023-10-28 MED ORDER — HYDRALAZINE HCL 20 MG/ML IJ SOLN: 5.0000 mg | INTRAMUSCULAR | Status: DC | PRN

## 2023-10-28 MED ORDER — INSULIN ASPART 100 UNIT/ML IJ SOLN
12.0000 [IU] | Freq: Once | INTRAMUSCULAR | Status: AC
  Administered 2023-10-28: 12 [IU] via SUBCUTANEOUS
  Filled 2023-10-28: qty 1

## 2023-10-28 MED ORDER — METOPROLOL TARTRATE 5 MG/5ML IV SOLN: 5.0000 mg | Freq: Once | INTRAVENOUS | Status: DC

## 2023-10-28 NOTE — Progress Notes (Signed)
 Patient arrieved via WC at 2015. Denies pain or discomfort.

## 2023-10-28 NOTE — ED Triage Notes (Signed)
 Patient brought over from [facility] for hyperglycemia. Labs abnormal from CMP drawn at [facility].  K 5.6 Glucose 557  A&O x4 during triage

## 2023-10-28 NOTE — H&P (Signed)
 History and Physical    Paul Goodman FMW:[id number] DOB: 22-Jan-1946 DOA: 10/28/2023  Referring MD/NP/PA:   PCP: Valora Agent, MD   Patient coming from:  The patient is coming from home.     Chief Complaint: Abnormal lab with hyperglycemia and hyperkalemia  HPI: Paul Goodman is a 78 y.o. male with medical history significant of HTN HLD, DM, COPD, sCHF with EF < 20%, stroke, CKD 32, BPH, A-fib on Eliquis , OSA on CPAP, obesity, depression with anxiety, who presents with abnormal lab with hyperglycemia and hyperkalemia.  Pt had a follow-up visit with his pulmonologist in [facility] today, and was found to have hyperglycemia and hyperkalemia.  Blood sugar is above 500 and and potassium is 5.6.  Patient was sent to ED for further evaluation and treatment.  He state that he stopped taking his diabetic medications and some other medications for more than 3 months.  He said he was asked to be evaluated by his physician before giving him more prescription, but he did not have appointment.  He states that sometimes he has heart racing, no chest pain.  He has mild dry cough and mild SOB, no fever or chills.  He does not have nausea, vomiting, diarrhea or abdominal pain.  He has polyuria with increased urinary frequency, but no dysuria or burning with urination.SABRA  He states that he fell 2 weeks ago, no LOC.   Pt is found to have A-fib with RVR, heart rate 110-140s in ED   Data reviewed independently and ED Course: pt was found to have WBC 8.6, blood sugar 543  by BMP, anion gap 11, BNP 987.1, worsening renal function with creatinine 1.30, BUN 34 and GFR 57 (recent baseline creatinine 0.92 on 02/09/2023), negative UA, temperature normal, blood pressure 136/112, heart rate 110-140s, RR 18, oxygen saturation 99% on room air.  CT of head negative for acute issues, and showed chronic infarction. Pt is placed in PCU for obs.     EKG: I have personally reviewed.  A-fib, QTc 131, LAD, poor R wave progression,  anteroseptal infarction pattern   Review of Systems:   General: no fevers, chills, no body weight gain, has fatigue HEENT: no blurry vision, hearing changes or sore throat Respiratory: has dyspnea, coughing, no wheezing CV: no chest pain, has intermittent palpitations GI: no nausea, vomiting, abdominal pain, diarrhea, constipation GU: no dysuria, burning on urination, has increased urinary frequency, no hematuria  Ext: has leg edema Neuro: no unilateral weakness, numbness, or tingling, no vision change or hearing loss Skin: no rash, no skin tear. MSK: No muscle spasm, no deformity, no limitation of range of movement in spin Heme: No easy bruising.  Travel history: No recent long distant travel.   Allergy: No Known Allergies  Past Medical History:  Diagnosis Date   Anxiety    Arthritis    CHF (congestive heart failure) (HCC)    COPD (chronic obstructive pulmonary disease) (HCC)    Diabetes (HCC)    ED (erectile dysfunction)    Heart murmur    Hyperlipidemia    Hypertension    Melanoma (HCC)    Sleep apnea     Past Surgical History:  Procedure Laterality Date   EYE SURGERY Right    cancer   HAND SURGERY Left 1966   KNEE SURGERY Left 1973   RIGHT/LEFT HEART CATH AND CORONARY ANGIOGRAPHY Bilateral 04/29/2023   Procedure: RIGHT/LEFT HEART CATH AND CORONARY ANGIOGRAPHY;  Surgeon: Florencio Cara BIRCH, MD;  Location: [facility] INVASIVE CV LAB;  Service: Cardiovascular;  Laterality: Bilateral;   TONSILLECTOMY  1964    Social History:  reports that he has quit smoking. His smoking use included cigarettes. He has quit using smokeless tobacco.  His smokeless tobacco use included chew. He reports that he does not currently use alcohol after a past usage of about 14.0 standard drinks of alcohol per week. He reports that he does not currently use drugs after having used the following drugs: Marijuana.  Family History:  Family History  Problem Relation Age of Onset   Kidney Stones  Father    Kidney disease Neg Hx    Prostate cancer Neg Hx    Kidney cancer Neg Hx    Bladder Cancer Neg Hx      Prior to Admission medications   Medication Sig Start Date End Date Taking? Authorizing Provider  albuterol  (PROVENTIL  HFA;VENTOLIN  HFA) 108 (90 BASE) MCG/ACT inhaler Inhale 2 puffs into the lungs every 6 (six) hours as needed for shortness of breath. 06/07/14   [provider]  amiodarone  (PACERONE ) 200 MG tablet Take 1 tablet (200 mg total) by mouth 2 (two) times daily. Patient taking differently: Take 200 mg by mouth daily. 06/06/20   Patel, Sona, MD  amLODipine (NORVASC) 10 MG tablet Take 10 mg by mouth daily. 03/26/23   [provider]  apixaban  (ELIQUIS ) 5 MG TABS tablet Take 1 tablet (5 mg total) by mouth 2 (two) times daily. 06/06/20   Patel, Sona, MD  carvedilol  (COREG ) 25 MG tablet Take 1 tablet (25 mg total) by mouth 2 (two) times daily with a meal. 06/06/20   Tobie Calix, MD  Chromium Picolinate (CHROMIUM PICOLATE PO) Take by mouth.    [provider]  dextromethorphan -guaiFENesin  (MUCINEX  DM) 30-600 MG 12hr tablet Take 1 tablet by mouth every evening.    [provider]  fenofibrate  160 MG tablet Take 160 mg by mouth daily. 06/16/14   [provider]  furosemide (LASIX) 20 MG tablet Take 20 mg by mouth daily as needed. 12/15/18   [provider]  hydrochlorothiazide  (HYDRODIURIL ) 25 MG tablet Take 25 mg by mouth daily. 06/21/22   [provider]  Insulin  Glargine (BASAGLAR  KWIKPEN) 100 UNIT/ML Inject 60 Units into the skin daily. 06/16/22   [provider]  liraglutide (VICTOZA) 18 MG/3ML SOPN Inject 1.2 mg into the skin once a week.    [provider]  LORazepam  (ATIVAN ) 1 MG tablet Take 0.5-1 mg by mouth daily as needed. TAKE ONE-HALF (1/2) TO ONE (1) TABLET BY MOUTH DAILY AS NEEDED FOR ANXIETY, PANIC, OR THROAT SWELLING Patient not taking: Reported on 04/29/2023 05/30/20   [provider]   losartan  (COZAAR ) 100 MG tablet Take 100 mg by mouth daily. 04/18/22   [provider]  PARoxetine  (PAXIL ) 20 MG tablet Take 20 mg by mouth daily. 12/07/13   [provider]  pramipexole (MIRAPEX) 0.125 MG tablet TAKE 1 TABLET BY MOUTH 2 HOURS BEFORE BEDTIME, MAY TITRATE BY 1 TABLET PER WEEK AS NEEDED MAXIMUM 4 TABLETS. Patient not taking: Reported on 04/29/2023 07/31/22   [provider]  rOPINIRole  (REQUIP  XL) 4 MG 24 hr tablet Take 12-15 mg by mouth at bedtime. 04/11/23 04/10/24  [provider]  rosuvastatin  (CRESTOR ) 5 MG tablet Take 5 mg by mouth daily. 07/27/22   [provider]  spironolactone  (ALDACTONE ) 25 MG tablet Take 0.5 tablets (12.5 mg total) by mouth daily. Patient taking differently: Take 25 mg by mouth 2 (two) times daily. 06/07/20  Patel, Sona, MD  VANADIUM PO Take by mouth.    [provider]  Zinc  Sulfate (ZINC  15 PO) Take by mouth.    [provider]    Physical Exam: Vitals:   10/28/23 1830 10/28/23 1900 10/28/23 2021 10/28/23 2236  BP: (!) 115/93 106/89 124/89 120/86  Pulse: (!) 105 69 (!) 104 (!) 52  Resp:  18 (!) 22 (!) 22  Temp:   (!) 97.4 F (36.3 C) 98.2 F (36.8 C)  TempSrc:   Oral   SpO2: 97% 97% 96% 96%  Weight:      Height:       General: Not in acute distress HEENT:       Eyes: PERRL, EOMI, no jaundice       ENT: No discharge from the ears and nose, no pharynx injection, no tonsillar enlargement.        Neck: No JVD, no bruit, no mass felt. Heme: No neck lymph node enlargement. Cardiac: S1/S2, irregularly irregular rhythm, no gallops or rubs. Respiratory: No rales, wheezing, rhonchi or rubs. GI: Soft, nondistended, nontender, no rebound pain, no organomegaly, BS present. GU: No hematuria Ext: 1+ pitting leg edema bilaterally. 1+DP/PT pulse bilaterally. Musculoskeletal: No joint deformities, No joint redness or warmth, no limitation of ROM in spin. Skin: No rashes.  Neuro: Alert,  oriented X3, cranial nerves II-XII grossly intact, moves all extremities normally. Psych: Patient is not psychotic, no suicidal or hemocidal ideation.  Labs on Admission: I have personally reviewed following labs and imaging studies  CBC: Recent Labs  Lab 10/28/23 1649  WBC 8.6  HGB 14.6  HCT 42.6  MCV 85.9  PLT 221   Basic Metabolic Panel: Recent Labs  Lab 10/28/23 1649  NA 131*  K 4.8  CL 100  CO2 20*  GLUCOSE 543*  BUN 34*  CREATININE 1.30*  CALCIUM  8.7*   GFR: Estimated Creatinine Clearance: 61.9 mL/min (A) (by C-G formula based on SCr of 1.3 mg/dL (H)). Liver Function Tests: No results for input(s): AST, ALT, ALKPHOS, BILITOT, PROT, ALBUMIN in the last 168 hours. No results for input(s): LIPASE, AMYLASE in the last 168 hours. No results for input(s): AMMONIA in the last 168 hours. Coagulation Profile: No results for input(s): INR, PROTIME in the last 168 hours. Cardiac Enzymes: No results for input(s): CKTOTAL, CKMB, CKMBINDEX, TROPONINI in the last 168 hours. BNP (last 3 results) No results for input(s): PROBNP in the last 8760 hours. HbA1C: No results for input(s): HGBA1C in the last 72 hours. CBG: Recent Labs  Lab 10/28/23 1640 10/28/23 2025  GLUCAP 574* 379*   Lipid Profile: No results for input(s): CHOL, HDL, LDLCALC, TRIG, CHOLHDL, LDLDIRECT in the last 72 hours. Thyroid Function Tests: No results for input(s): TSH, T4TOTAL, FREET4, T3FREE, THYROIDAB in the last 72 hours. Anemia Panel: No results for input(s): VITAMINB12, FOLATE, FERRITIN, TIBC, IRON, RETICCTPCT in the last 72 hours. Urine analysis:    Component Value Date/Time   COLORURINE YELLOW (A) 10/28/2023 1834   APPEARANCEUR CLEAR (A) 10/28/2023 1834   APPEARANCEUR Clear 04/17/2012 2023   LABSPEC 1.030 10/28/2023 1834   LABSPEC 1.026 04/17/2012 2023   PHURINE 5.0 10/28/2023 1834   GLUCOSEU >=500 (A) 10/28/2023 1834    GLUCOSEU Negative 04/17/2012 2023   HGBUR NEGATIVE 10/28/2023 1834   BILIRUBINUR NEGATIVE 10/28/2023 1834   BILIRUBINUR Negative 04/17/2012 2023   KETONESUR NEGATIVE 10/28/2023 1834   PROTEINUR NEGATIVE 10/28/2023 1834   NITRITE NEGATIVE 10/28/2023 1834   LEUKOCYTESUR NEGATIVE 10/28/2023 1834  LEUKOCYTESUR Negative 04/17/2012 2023   Sepsis Labs: @LABRCNTIP (procalcitonin:4,lacticidven:4) )No results found for this or any previous visit (from the past 240 hours).   Radiological Exams on Admission:   Assessment/Plan Principal Problem:   Atrial fibrillation with RVR (HCC) Active Problems:   Fall at home, initial encounter   Type II diabetes mellitus with renal manifestations (HCC)   Depression with anxiety   COPD (chronic obstructive pulmonary disease) (HCC)   Hypertension   Hyperlipidemia   Chronic systolic CHF (congestive heart failure) (HCC)   Stroke (HCC)   Acute renal failure superimposed on stage 2 chronic kidney disease (HCC)   OSA on CPAP   Obesity (BMI 30-39.9)   Assessment and Plan:   Atrial fibrillation with RVR (HCC): HR is 110-140s.  Possibly due to medication noncompliance. - place in tele bed for obs - start home amiodarone  200 mg daily and Coreg  25 mg twice daily - Start Eliquis  (I discussed with patient, he agreed to restart medications which are necessary for him) - Check TSH level - will start amiodarone  drip if heart rate is persistently above 125 (cannot due to Cardizem  drip due to low EF<20%)  Fall at home, initial encounter: CT -head negative for injury - Fall precaution - PT/OT  Type II diabetes mellitus with renal manifestations [facility]): Recent A1c 7.7, poorly controlled.  Patient is supposed to take Victoza and glargine insulin  60 units daily, but not taking medications currently.  Blood sugar 543 with normal anion gap 11 - Start glargine insulin  45 units daily - SSI  Depression with anxiety -As needed Ativan  - Paxlovid Requip   COPD  (chronic obstructive pulmonary disease) (HCC): No wheezing -Bronchodilators as needed Mucinex   Hypertension: Blood pressure 138/112 -IV hydralazine  as needed - Coreg   Hyperlipidemia -Crestor   Chronic systolic CHF (congestive heart failure) (HCC): 2D echo 05/26/2020 showed EF<20%.  Patient has 1+ leg edema, elevated BNP 987, but no acute respiratory distress, no oxygen desaturation, does not seem to have acute CHF exacerbation. -Hold Lasix and spironolactone  due to worsening renal function  Stroke (HCC) -Crestor  - Patient is on Eliquis  for A-fib  Acute renal failure superimposed on stage 2 chronic kidney disease (HCC):  - Lasix and spironolactone  - Hold HCTZ and Cozaar  - Patient received 1 L normal saline in ED  OSA  -on CPAP  Obesity (BMI 30-39.9): Patient has Obesity Class I, with body weight 106.6 Kg and BMI 30.17 kg/m2.  - Encourage losing weight - Exercise and healthy diet        DVT ppx: on Eliquis   Code Status: Full code     Family Communication:     not done, no family member is at bed side.    Disposition Plan:  Anticipate discharge back to previous environment  Consults called:  none  Admission status and Level of care: Progressive:    for obs        Dispo: The patient is from: Home              Anticipated d/c is to: Home              Anticipated d/c date is: 1 day              Patient currently is not medically stable to d/c.    Severity of Illness:  The appropriate patient status for this patient is OBSERVATION. Observation status is judged to be reasonable and necessary in order to provide the required intensity of service to ensure the patient's safety. The  patient's presenting symptoms, physical exam findings, and initial radiographic and laboratory data in the context of their medical condition is felt to place them at decreased risk for further clinical deterioration. Furthermore, it is anticipated that the patient will be medically stable  for discharge from the hospital within 2 midnights of admission.        Date of Service 10/29/2023    Caleb Exon [facility]   If 7PM-7AM, please contact night-coverage [email or web address] 10/29/2023, 12:07 AM

## 2023-10-28 NOTE — ED Notes (Signed)
 Patient currently eating dinner.  No complaints voiced.  Updated on plan of care

## 2023-10-28 NOTE — H&P (Incomplete)
 History and Physical    Paul Goodman FMW:[id number] DOB: 10/13/1945 DOA: 10/28/2023  Referring MD/NP/PA:   PCP: Valora Agent, MD   Patient coming from:  The patient is coming from home.     Chief Complaint: Abnormal lab with hyperglycemia and hyperkalemia  HPI: Paul Goodman is a 78 y.o. male with medical history significant of HTNm HLD, DM, COPD, sCHF with EF < 20%, stroke, CKD 32, BPH, A-fib on Eliquis , OSA on CPAP, obesity, depression with anxiety, who presents with abnormal lab with hyperglycemia and hyperkalemia.  Pt had a follow-up visit with his pulmonologist in [facility] today, and was found to have hyperglycemia and hyperkalemia.  Blood sugar is above 500 and and potassium is 5.6.  Patient was sent to ED for further evaluation and treatment.  He state that he stopped taking his diabetic medications for more than 3 months.  He said he was asked to be evaluated by his physician before giving him more prescription, but he did not have appointment.  He states that sometimes he has heart racing, no chest pain.  He has mild dry cough and mild SOB, no fever or chills.  He does not have nausea, vomiting, diarrhea or abdominal pain.  He has polyuria with increased urinary frequency, but no dysuria or burning with urination.SABRA  He states that he fell 2 weeks ago, no LOC.   Pt is found to have A-fib with RVR, heart rate 110-140s in ED   Data reviewed independently and ED Course: pt was found to have WBC 8.6, blood sugar 543  by BMP, anion gap 11, BNP 987.1, worsening renal function with creatinine 1.30, BUN 34 and GFR 57 (recent baseline creatinine 0.92 on 02/09/2023), negative UA, temperature normal, blood pressure 136/112, heart rate 110-140s, RR 18, oxygen saturation 99% on room air.  CT of head negative for acute issues, and showed chronic infarction. Pt is placed in PCU for obs.     EKG: I have personally reviewed.  A-fib, QTc 131, LAD, poor R wave progression, anteroseptal infarction  pattern   Review of Systems:   General: no fevers, chills, no body weight gain, has fatigue HEENT: no blurry vision, hearing changes or sore throat Respiratory: has dyspnea, coughing, no wheezing CV: no chest pain, has intermittent palpitations GI: no nausea, vomiting, abdominal pain, diarrhea, constipation GU: no dysuria, burning on urination, has increased urinary frequency, no hematuria  Ext: has leg edema Neuro: no unilateral weakness, numbness, or tingling, no vision change or hearing loss Skin: no rash, no skin tear. MSK: No muscle spasm, no deformity, no limitation of range of movement in spin Heme: No easy bruising.  Travel history: No recent long distant travel.   Allergy: No Known Allergies  Past Medical History:  Diagnosis Date  . Anxiety   . Arthritis   . CHF (congestive heart failure) (HCC)   . COPD (chronic obstructive pulmonary disease) (HCC)   . Diabetes (HCC)   . ED (erectile dysfunction)   . Heart murmur   . Hyperlipidemia   . Hypertension   . Melanoma (HCC)   . Sleep apnea     Past Surgical History:  Procedure Laterality Date  . EYE SURGERY Right    cancer  . HAND SURGERY Left 1966  . KNEE SURGERY Left 1973  . RIGHT/LEFT HEART CATH AND CORONARY ANGIOGRAPHY Bilateral 04/29/2023   Procedure: RIGHT/LEFT HEART CATH AND CORONARY ANGIOGRAPHY;  Surgeon: Florencio Cara BIRCH, MD;  Location: [facility] INVASIVE CV LAB;  Service: Cardiovascular;  Laterality: Bilateral;  . TONSILLECTOMY  1964    Social History:  reports that he has quit smoking. His smoking use included cigarettes. He has quit using smokeless tobacco.  His smokeless tobacco use included chew. He reports that he does not currently use alcohol after a past usage of about 14.0 standard drinks of alcohol per week. He reports that he does not currently use drugs after having used the following drugs: Marijuana.  Family History:  Family History  Problem Relation Age of Onset  . Kidney Stones Father   .  Kidney disease Neg Hx   . Prostate cancer Neg Hx   . Kidney cancer Neg Hx   . Bladder Cancer Neg Hx      Prior to Admission medications   Medication Sig Start Date End Date Taking? Authorizing Provider  albuterol  (PROVENTIL  HFA;VENTOLIN  HFA) 108 (90 BASE) MCG/ACT inhaler Inhale 2 puffs into the lungs every 6 (six) hours as needed for shortness of breath. 06/07/14   [provider]  amiodarone  (PACERONE ) 200 MG tablet Take 1 tablet (200 mg total) by mouth 2 (two) times daily. Patient taking differently: Take 200 mg by mouth daily. 06/06/20   Patel, Sona, MD  amLODipine (NORVASC) 10 MG tablet Take 10 mg by mouth daily. 03/26/23   [provider]  apixaban  (ELIQUIS ) 5 MG TABS tablet Take 1 tablet (5 mg total) by mouth 2 (two) times daily. 06/06/20   Patel, Sona, MD  carvedilol  (COREG ) 25 MG tablet Take 1 tablet (25 mg total) by mouth 2 (two) times daily with a meal. 06/06/20   Tobie Calix, MD  Chromium Picolinate (CHROMIUM PICOLATE PO) Take by mouth.    [provider]  dextromethorphan -guaiFENesin  (MUCINEX  DM) 30-600 MG 12hr tablet Take 1 tablet by mouth every evening.    [provider]  fenofibrate  160 MG tablet Take 160 mg by mouth daily. 06/16/14   [provider]  furosemide (LASIX) 20 MG tablet Take 20 mg by mouth daily as needed. 12/15/18   [provider]  hydrochlorothiazide  (HYDRODIURIL ) 25 MG tablet Take 25 mg by mouth daily. 06/21/22   [provider]  Insulin  Glargine (BASAGLAR  KWIKPEN) 100 UNIT/ML Inject 60 Units into the skin daily. 06/16/22   [provider]  liraglutide (VICTOZA) 18 MG/3ML SOPN Inject 1.2 mg into the skin once a week.    [provider]  LORazepam  (ATIVAN ) 1 MG tablet Take 0.5-1 mg by mouth daily as needed. TAKE ONE-HALF (1/2) TO ONE (1) TABLET BY MOUTH DAILY AS NEEDED FOR ANXIETY, PANIC, OR THROAT SWELLING Patient not taking: Reported on 04/29/2023 05/30/20   [provider]   losartan  (COZAAR ) 100 MG tablet Take 100 mg by mouth daily. 04/18/22   [provider]  PARoxetine  (PAXIL ) 20 MG tablet Take 20 mg by mouth daily. 12/07/13   [provider]  pramipexole (MIRAPEX) 0.125 MG tablet TAKE 1 TABLET BY MOUTH 2 HOURS BEFORE BEDTIME, MAY TITRATE BY 1 TABLET PER WEEK AS NEEDED MAXIMUM 4 TABLETS. Patient not taking: Reported on 04/29/2023 07/31/22   [provider]  rOPINIRole  (REQUIP  XL) 4 MG 24 hr tablet Take 12-15 mg by mouth at bedtime. 04/11/23 04/10/24  [provider]  rosuvastatin  (CRESTOR ) 5 MG tablet Take 5 mg by mouth daily. 07/27/22   [provider]  spironolactone  (ALDACTONE ) 25 MG tablet Take 0.5 tablets (12.5 mg total) by mouth daily. Patient taking differently: Take 25 mg by mouth 2 (two) times daily. 06/07/20   Patel, Sona,  MD  VANADIUM PO Take by mouth.    [provider]  Zinc  Sulfate (ZINC  15 PO) Take by mouth.    [provider]    Physical Exam: Vitals:   10/28/23 1830 10/28/23 1900 10/28/23 2021 10/28/23 2236  BP: (!) 115/93 106/89 124/89 120/86  Pulse: (!) 105 69 (!) 104 (!) 52  Resp:  18 (!) 22 (!) 22  Temp:   (!) 97.4 F (36.3 C) 98.2 F (36.8 C)  TempSrc:   Oral   SpO2: 97% 97% 96% 96%  Weight:      Height:       General: Not in acute distress HEENT:       Eyes: PERRL, EOMI, no jaundice       ENT: No discharge from the ears and nose, no pharynx injection, no tonsillar enlargement.        Neck: No JVD, no bruit, no mass felt. Heme: No neck lymph node enlargement. Cardiac: S1/S2, irregularly irregular rhythm, no gallops or rubs. Respiratory: No rales, wheezing, rhonchi or rubs. GI: Soft, nondistended, nontender, no rebound pain, no organomegaly, BS present. GU: No hematuria Ext: 1+ pitting leg edema bilaterally. 1+DP/PT pulse bilaterally. Musculoskeletal: No joint deformities, No joint redness or warmth, no limitation of ROM in spin. Skin: No rashes.  Neuro: Alert,  oriented X3, cranial nerves II-XII grossly intact, moves all extremities normally. Psych: Patient is not psychotic, no suicidal or hemocidal ideation.  Labs on Admission: I have personally reviewed following labs and imaging studies  CBC: Recent Labs  Lab 10/28/23 1649  WBC 8.6  HGB 14.6  HCT 42.6  MCV 85.9  PLT 221   Basic Metabolic Panel: Recent Labs  Lab 10/28/23 1649  NA 131*  K 4.8  CL 100  CO2 20*  GLUCOSE 543*  BUN 34*  CREATININE 1.30*  CALCIUM  8.7*   GFR: Estimated Creatinine Clearance: 61.9 mL/min (A) (by C-G formula based on SCr of 1.3 mg/dL (H)). Liver Function Tests: No results for input(s): AST, ALT, ALKPHOS, BILITOT, PROT, ALBUMIN in the last 168 hours. No results for input(s): LIPASE, AMYLASE in the last 168 hours. No results for input(s): AMMONIA in the last 168 hours. Coagulation Profile: No results for input(s): INR, PROTIME in the last 168 hours. Cardiac Enzymes: No results for input(s): CKTOTAL, CKMB, CKMBINDEX, TROPONINI in the last 168 hours. BNP (last 3 results) No results for input(s): PROBNP in the last 8760 hours. HbA1C: No results for input(s): HGBA1C in the last 72 hours. CBG: Recent Labs  Lab 10/28/23 1640 10/28/23 2025  GLUCAP 574* 379*   Lipid Profile: No results for input(s): CHOL, HDL, LDLCALC, TRIG, CHOLHDL, LDLDIRECT in the last 72 hours. Thyroid Function Tests: No results for input(s): TSH, T4TOTAL, FREET4, T3FREE, THYROIDAB in the last 72 hours. Anemia Panel: No results for input(s): VITAMINB12, FOLATE, FERRITIN, TIBC, IRON, RETICCTPCT in the last 72 hours. Urine analysis:    Component Value Date/Time   COLORURINE YELLOW (A) 10/28/2023 1834   APPEARANCEUR CLEAR (A) 10/28/2023 1834   APPEARANCEUR Clear 04/17/2012 2023   LABSPEC 1.030 10/28/2023 1834   LABSPEC 1.026 04/17/2012 2023   PHURINE 5.0 10/28/2023 1834   GLUCOSEU >=500 (A) 10/28/2023 1834    GLUCOSEU Negative 04/17/2012 2023   HGBUR NEGATIVE 10/28/2023 1834   BILIRUBINUR NEGATIVE 10/28/2023 1834   BILIRUBINUR Negative 04/17/2012 2023   KETONESUR NEGATIVE 10/28/2023 1834   PROTEINUR NEGATIVE 10/28/2023 1834   NITRITE NEGATIVE 10/28/2023 1834   LEUKOCYTESUR NEGATIVE 10/28/2023 1834   LEUKOCYTESUR  Negative 04/17/2012 2023   Sepsis Labs: @LABRCNTIP (procalcitonin:4,lacticidven:4) )No results found for this or any previous visit (from the past 240 hours).   Radiological Exams on Admission:   Assessment/Plan Principal Problem:   Atrial fibrillation with RVR (HCC) Active Problems:   Fall at home, initial encounter   Type II diabetes mellitus with renal manifestations (HCC)   Depression with anxiety   COPD (chronic obstructive pulmonary disease) (HCC)   Hypertension   Hyperlipidemia   Chronic systolic CHF (congestive heart failure) (HCC)   Stroke (HCC)   Acute renal failure superimposed on stage 2 chronic kidney disease (HCC)   OSA on CPAP   Obesity (BMI 30-39.9)   Assessment and Plan:   Atrial fibrillation with RVR (HCC): HR is 110-140s.  Possibly due to medication noncompliance.  -place in tele bed for obs - start home amiodarone  200 mg daily and Coreg  25 mg twice daily - Start Eliquis  - Check TSH level  Fall at home, initial encounter: CT -head negative for injury - Fall precaution - PT/OT  Type II diabetes mellitus with renal manifestations [facility]): Recent A1c 7.7, poorly controlled.  Patient is supposed to take Victoza and glargine insulin  60 units daily, but not taking medications currently.  Blood sugar 543 with normal anion gap 11 - Start glargine insulin  45 units daily - SSI  Depression with anxiety -    COPD (chronic obstructive pulmonary disease) (HCC)   Hypertension   Hyperlipidemia   Chronic systolic CHF (congestive heart failure) (HCC)   Stroke (HCC)   Acute renal failure superimposed on stage 2 chronic kidney disease (HCC)   OSA on CPAP    Obesity (BMI 30-39.9)      DVT ppx: on Eliquis   Code Status: Full code     Family Communication:     not done, no family member is at bed side.    Disposition Plan:  Anticipate discharge back to previous environment  Consults called:  none  Admission status and Level of care: Progressive:    for obs        Dispo: The patient is from: Home              Anticipated d/c is to: Home              Anticipated d/c date is: 1 day              Patient currently is not medically stable to d/c.    Severity of Illness:  The appropriate patient status for this patient is OBSERVATION. Observation status is judged to be reasonable and necessary in order to provide the required intensity of service to ensure the patient's safety. The patient's presenting symptoms, physical exam findings, and initial radiographic and laboratory data in the context of their medical condition is felt to place them at decreased risk for further clinical deterioration. Furthermore, it is anticipated that the patient will be medically stable for discharge from the hospital within 2 midnights of admission.        Date of Service 10/28/2023    Caleb Exon [facility]   If 7PM-7AM, please contact night-coverage [email or web address] 10/28/2023, 11:59 PM

## 2023-10-28 NOTE — ED Provider Triage Note (Addendum)
 Emergency Medicine Provider Triage Evaluation Note  Paul Goodman , a 78 y.o. male  was evaluated in triage.  Pt complains of high blood sugar.  Patient was seen at [facility] health clinic with blood sugar of 554, potassium 5.6.  Patient glucose during triage was 574.  States feeling confused, foggy, with headache, thirsty, intermittent abdominal pain, shortness of breath.  Review of Systems  Positive:  Negative:   Physical Exam  BP (!) 138/112 (BP Location: Left Arm)   Pulse (!) 146   Temp 97.7 F (36.5 C) (Oral)   Resp 18   Ht 6' 2 (1.88 m)   Wt 106.6 kg   SpO2 99%   BMI 30.17 kg/m during triage patient is hypertensive, tachycardic Gen:   Awake, no distress   Resp:  Normal effort no wheezing MSK:   Moves extremities without difficulty bilateral pitting edema grade 2 Other:    Medical Decision Making  Medically screening exam initiated at 4:46 PM.  Appropriate orders placed.  Paul Goodman was informed that the remainder of the evaluation will be completed by another provider, this initial triage assessment does not replace that evaluation, and the importance of remaining in the ED until their evaluation is complete.  Patient with history of diabetes who was seen today with glucose of 574 at triage, shortness of breath, pitting edema on lower extremities.  There is CBC CMP, BNP, chest x-ray UA ruling out DKA, CHF.   Janit Kast, PA-C 10/28/23 1649    Janit Kast, PA-C 10/28/23 1650

## 2023-10-28 NOTE — ED Provider Notes (Signed)
 [facility] Provider Note    Event Date/Time   First MD Initiated Contact with Patient 10/28/23 1815     (approximate)   History   Chief Complaint: Hyperglycemia   HPI  Paul Goodman is a 78 y.o. male with a history of CHF COPD diabetes hypertension who is sent to the ED from [facility] clinic pulmonology due to hyperglycemia.  Patient reports that he has been off all of his diabetes medication for the last 5 months due to his prior physician not providing additional refills without him making an appointment and being evaluated again.  He reports fatigue, brain fog, episodes of confusion.        Past Medical History:  Diagnosis Date   Anxiety    Arthritis    CHF (congestive heart failure) (HCC)    COPD (chronic obstructive pulmonary disease) (HCC)    Diabetes (HCC)    ED (erectile dysfunction)    Heart murmur    Hyperlipidemia    Hypertension    Melanoma [facility])    Sleep apnea     Current Outpatient Rx   Order #: [id number] Class: Historical Med   Order #: [id number] Class: Normal   Order #: [id number] Class: Historical Med   Order #: [id number] Class: Normal   Order #: [id number] Class: Normal   Order #: [id number] Class: Historical Med   Order #: [id number] Class: Historical Med   Order #: [id number] Class: Historical Med   Order #: [id number] Class: Historical Med   Order #: [id number] Class: Historical Med   Order #: [id number] Class: Historical Med   Order #: [id number] Class: Historical Med   Order #: [id number] Class: Historical Med   Order #: [id number] Class: Historical Med   Order #: [id number] Class: Historical Med   Order #: [id number] Class: Historical Med   Order #: [id number] Class: Historical Med   Order #: [id number] Class: Historical Med   Order #: [id number] Class: Normal   Order #: [id number] Class: Historical Med   Order #: [id number] Class: Historical Med    Past Surgical History:  Procedure Laterality Date   EYE SURGERY Right    cancer   HAND  SURGERY Left 1966   KNEE SURGERY Left 1973   RIGHT/LEFT HEART CATH AND CORONARY ANGIOGRAPHY Bilateral 04/29/2023   Procedure: RIGHT/LEFT HEART CATH AND CORONARY ANGIOGRAPHY;  Surgeon: Florencio Cara BIRCH, MD;  Location: [facility] INVASIVE CV LAB;  Service: Cardiovascular;  Laterality: Bilateral;   TONSILLECTOMY  1964    Physical Exam   Triage Vital Signs: ED Triage Vitals  Encounter Vitals Group     BP 10/28/23 1643 (!) 138/112     Girls Systolic BP Percentile --      Girls Diastolic BP Percentile --      Boys Systolic BP Percentile --      Boys Diastolic BP Percentile --      Pulse Rate 10/28/23 1643 (!) 146     Resp 10/28/23 1643 18     Temp 10/28/23 1643 97.7 F (36.5 C)     Temp Source 10/28/23 1643 Oral     SpO2 10/28/23 1643 99 %     Weight 10/28/23 1645 235 lb (106.6 kg)     Height 10/28/23 1645 6' 2 (1.88 m)     Head Circumference --      Peak Flow --      Pain Score 10/28/23 1644 7     Pain Loc --      Pain Education --      Exclude from Growth Chart --     Most recent vital  signs: Vitals:   10/28/23 1830 10/28/23 1900  BP: (!) 115/93 106/89  Pulse: (!) 105 69  Resp:  18  Temp:    SpO2: 97% 97%    General: Awake, no distress.  CV:  Good peripheral perfusion.  Regular rate Resp:  Normal effort.  Clear lungs Abd:  No distention.  Soft nontender Other:  Dry oral mucosa   ED Results / Procedures / Treatments   Labs (all labs ordered are listed, but only abnormal results are displayed) Labs Reviewed  BASIC METABOLIC PANEL WITH GFR - Abnormal; Notable for the following components:      Result Value   Sodium 131 (*)    CO2 20 (*)    Glucose, Bld 543 (*)    BUN 34 (*)    Creatinine, Ser 1.30 (*)    Calcium  8.7 (*)    GFR, Estimated 57 (*)    All other components within normal limits  URINALYSIS, ROUTINE W REFLEX MICROSCOPIC - Abnormal; Notable for the following components:   Color, Urine YELLOW (*)    APPearance CLEAR (*)    Glucose, UA >=500 (*)     All other components within normal limits  BRAIN NATRIURETIC PEPTIDE - Abnormal; Notable for the following components:   B Natriuretic Peptide 987.7 (*)    All other components within normal limits  CBG MONITORING, ED - Abnormal; Notable for the following components:   Glucose-Capillary 574 (*)    All other components within normal limits  CBC  BASIC METABOLIC PANEL WITH GFR  CBC  CBG MONITORING, ED  CBG MONITORING, ED     EKG Interpreted by me Atrial fibrillation, rate of 131.  Normal axis, normal intervals.  Poor R wave progression.  No acute ischemic changes.   RADIOLOGY CT head interpreted by me, no acute findings.  Radiology report reviewed   PROCEDURES:  Procedures   MEDICATIONS ORDERED IN ED: Medications  albuterol  (PROVENTIL ) (2.5 MG/3ML) 0.083% nebulizer solution 2.5 mg (has no administration in time range)  dextromethorphan -guaiFENesin  (MUCINEX  DM) 30-600 MG per 12 hr tablet 1 tablet (has no administration in time range)  ondansetron  (ZOFRAN ) injection 4 mg (has no administration in time range)  hydrALAZINE  (APRESOLINE ) injection 5 mg (has no administration in time range)  acetaminophen  (TYLENOL ) tablet 650 mg (has no administration in time range)  insulin  aspart (novoLOG ) injection 0-9 Units (has no administration in time range)  insulin  aspart (novoLOG ) injection 0-5 Units (has no administration in time range)  insulin  glargine-yfgn (SEMGLEE ) injection 45 Units (has no administration in time range)  amiodarone  (PACERONE ) tablet 200 mg (has no administration in time range)  sodium chloride  0.9 % bolus 1,000 mL (1,000 mLs Intravenous New Bag/Given 10/28/23 1828)  insulin  aspart (novoLOG ) injection 12 Units (12 Units Subcutaneous Given 10/28/23 1833)  ondansetron  (ZOFRAN ) injection 4 mg (0 mg Intravenous Hold 10/28/23 1847)     IMPRESSION / MDM / ASSESSMENT AND PLAN / ED COURSE  I reviewed the triage vital signs and the nursing notes.  DDx: Dehydration,  electrolyte derangement, anemia, depression, metabolic acidosis, UTI  Patient's presentation is most consistent with acute presentation with potential threat to life or bodily function.  Patient presents with hyperglycemia due to medication nonadherence in the setting of chronic diabetes.  Patient expresses dissatisfaction with diabetes being managed as a chronic disease, has some expectation that it could be cured.  He is agreeable to hospitalization for hydration and stabilization.   Clinical Course as of 10/28/23 1950  Mon Oct 28, 2023  1828 Case discussed with hospitalist for further management. [PS]    Clinical Course User Index [PS] Viviann Pastor, MD     FINAL CLINICAL IMPRESSION(S) / ED DIAGNOSES   Final diagnoses:  Type 2 diabetes mellitus with hyperglycemia, unspecified whether long term insulin  use (HCC)     Rx / DC Orders   ED Discharge Orders     None        Note:  This document was prepared using Dragon voice recognition software and may include unintentional dictation errors.   Viviann Pastor, MD 10/28/23 1950

## 2023-10-29 ENCOUNTER — Inpatient Hospital Stay

## 2023-10-29 ENCOUNTER — Other Ambulatory Visit (HOSPITAL_COMMUNITY): Payer: Self-pay

## 2023-10-29 ENCOUNTER — Telehealth (HOSPITAL_COMMUNITY): Payer: Self-pay | Admitting: Pharmacy Technician

## 2023-10-29 ENCOUNTER — Inpatient Hospital Stay: Admit: 2023-10-29

## 2023-10-29 DIAGNOSIS — J449 Chronic obstructive pulmonary disease, unspecified: Secondary | ICD-10-CM | POA: Diagnosis present

## 2023-10-29 DIAGNOSIS — Z794 Long term (current) use of insulin: Secondary | ICD-10-CM | POA: Diagnosis not present

## 2023-10-29 DIAGNOSIS — N179 Acute kidney failure, unspecified: Secondary | ICD-10-CM | POA: Diagnosis not present

## 2023-10-29 DIAGNOSIS — Z6831 Body mass index (BMI) 31.0-31.9, adult: Secondary | ICD-10-CM | POA: Diagnosis not present

## 2023-10-29 DIAGNOSIS — E1165 Type 2 diabetes mellitus with hyperglycemia: Secondary | ICD-10-CM | POA: Diagnosis present

## 2023-10-29 DIAGNOSIS — E875 Hyperkalemia: Secondary | ICD-10-CM | POA: Diagnosis present

## 2023-10-29 DIAGNOSIS — I428 Other cardiomyopathies: Secondary | ICD-10-CM | POA: Diagnosis present

## 2023-10-29 DIAGNOSIS — K761 Chronic passive congestion of liver: Secondary | ICD-10-CM | POA: Diagnosis present

## 2023-10-29 DIAGNOSIS — F0631 Mood disorder due to known physiological condition with depressive features: Secondary | ICD-10-CM

## 2023-10-29 DIAGNOSIS — Z66 Do not resuscitate: Secondary | ICD-10-CM | POA: Diagnosis present

## 2023-10-29 DIAGNOSIS — E785 Hyperlipidemia, unspecified: Secondary | ICD-10-CM | POA: Diagnosis present

## 2023-10-29 DIAGNOSIS — N170 Acute kidney failure with tubular necrosis: Secondary | ICD-10-CM | POA: Diagnosis not present

## 2023-10-29 DIAGNOSIS — I4821 Permanent atrial fibrillation: Secondary | ICD-10-CM | POA: Diagnosis present

## 2023-10-29 DIAGNOSIS — I5023 Acute on chronic systolic (congestive) heart failure: Secondary | ICD-10-CM | POA: Diagnosis present

## 2023-10-29 DIAGNOSIS — I429 Cardiomyopathy, unspecified: Secondary | ICD-10-CM | POA: Diagnosis not present

## 2023-10-29 DIAGNOSIS — F32A Depression, unspecified: Secondary | ICD-10-CM | POA: Diagnosis present

## 2023-10-29 DIAGNOSIS — I5084 End stage heart failure: Secondary | ICD-10-CM | POA: Diagnosis present

## 2023-10-29 DIAGNOSIS — R570 Cardiogenic shock: Secondary | ICD-10-CM | POA: Diagnosis not present

## 2023-10-29 DIAGNOSIS — G43909 Migraine, unspecified, not intractable, without status migrainosus: Secondary | ICD-10-CM | POA: Diagnosis not present

## 2023-10-29 DIAGNOSIS — J9601 Acute respiratory failure with hypoxia: Secondary | ICD-10-CM | POA: Diagnosis not present

## 2023-10-29 DIAGNOSIS — Z515 Encounter for palliative care: Secondary | ICD-10-CM | POA: Diagnosis not present

## 2023-10-29 DIAGNOSIS — E876 Hypokalemia: Secondary | ICD-10-CM | POA: Diagnosis present

## 2023-10-29 DIAGNOSIS — G928 Other toxic encephalopathy: Secondary | ICD-10-CM | POA: Diagnosis not present

## 2023-10-29 DIAGNOSIS — E1122 Type 2 diabetes mellitus with diabetic chronic kidney disease: Secondary | ICD-10-CM | POA: Diagnosis present

## 2023-10-29 DIAGNOSIS — I130 Hypertensive heart and chronic kidney disease with heart failure and stage 1 through stage 4 chronic kidney disease, or unspecified chronic kidney disease: Secondary | ICD-10-CM | POA: Diagnosis present

## 2023-10-29 DIAGNOSIS — Z7189 Other specified counseling: Secondary | ICD-10-CM | POA: Diagnosis not present

## 2023-10-29 DIAGNOSIS — Y92009 Unspecified place in unspecified non-institutional (private) residence as the place of occurrence of the external cause: Secondary | ICD-10-CM | POA: Diagnosis not present

## 2023-10-29 DIAGNOSIS — I4891 Unspecified atrial fibrillation: Secondary | ICD-10-CM | POA: Diagnosis present

## 2023-10-29 DIAGNOSIS — W19XXXA Unspecified fall, initial encounter: Secondary | ICD-10-CM | POA: Diagnosis present

## 2023-10-29 DIAGNOSIS — I5022 Chronic systolic (congestive) heart failure: Secondary | ICD-10-CM | POA: Diagnosis not present

## 2023-10-29 DIAGNOSIS — F419 Anxiety disorder, unspecified: Secondary | ICD-10-CM | POA: Diagnosis present

## 2023-10-29 LAB — BASIC METABOLIC PANEL WITH GFR
Anion gap: 10 (ref 5–15)
BUN: 31 mg/dL — ABNORMAL HIGH (ref 8–23)
CO2: 22 mmol/L (ref 22–32)
Calcium: 8.3 mg/dL — ABNORMAL LOW (ref 8.9–10.3)
Chloride: 104 mmol/L (ref 98–111)
Creatinine, Ser: 0.99 mg/dL (ref 0.61–1.24)
GFR, Estimated: >60 mL/min (ref >60)
Glucose, Bld: 203 mg/dL — ABNORMAL HIGH (ref 70–99)
Potassium: 3.9 mmol/L (ref 3.5–5.1)
Sodium: 136 mmol/L (ref 135–145)

## 2023-10-29 LAB — CBC
HCT: 41.3 % (ref 39.0–52.0)
Hemoglobin: 14.1 g/dL (ref 13.0–17.0)
MCH: 29.3 pg (ref 26.0–34.0)
MCHC: 34.1 g/dL (ref 30.0–36.0)
MCV: 85.9 fL (ref 80.0–100.0)
Platelets: 202 10*3/uL (ref 150–400)
RBC: 4.81 MIL/uL (ref 4.22–5.81)
RDW: 13 % (ref 11.5–15.5)
WBC: 7.9 10*3/uL (ref 4.0–10.5)
nRBC: 0 % (ref 0.0–0.2)

## 2023-10-29 LAB — MAGNESIUM: Magnesium: 2 mg/dL (ref 1.7–2.4)

## 2023-10-29 LAB — VITAMIN D 25 HYDROXY (VIT D DEFICIENCY, FRACTURES): Vit D, 25-Hydroxy: 26.16 ng/mL — ABNORMAL LOW (ref 30–100)

## 2023-10-29 LAB — HEMOGLOBIN A1C
Hgb A1c MFr Bld: 12.8 % — ABNORMAL HIGH (ref 4.8–5.6)
Mean Plasma Glucose: 320.66 mg/dL

## 2023-10-29 LAB — GLUCOSE, CAPILLARY
Glucose-Capillary: 230 mg/dL — ABNORMAL HIGH (ref 70–99)
Glucose-Capillary: 367 mg/dL — ABNORMAL HIGH (ref 70–99)
Glucose-Capillary: 337 mg/dL — ABNORMAL HIGH (ref 70–99)
Glucose-Capillary: 246 mg/dL — ABNORMAL HIGH (ref 70–99)

## 2023-10-29 LAB — TSH: TSH: 1.812 u[IU]/mL (ref 0.350–4.500)

## 2023-10-29 LAB — VITAMIN B12: Vitamin B-12: 546 pg/mL (ref 180–914)

## 2023-10-29 LAB — PHOSPHORUS: Phosphorus: 4 mg/dL (ref 2.5–4.6)

## 2023-10-29 MED ORDER — AMIODARONE HCL IN DEXTROSE 360-4.14 MG/200ML-% IV SOLN
60.0000 mg/h | INTRAVENOUS | Status: AC
  Administered 2023-10-29: 60 mg/h via INTRAVENOUS
  Filled 2023-10-29: qty 200

## 2023-10-29 MED ORDER — FUROSEMIDE 10 MG/ML IJ SOLN
80.0000 mg | Freq: Once | INTRAMUSCULAR | Status: AC
  Administered 2023-10-29: 80 mg via INTRAVENOUS
  Filled 2023-10-29: qty 8

## 2023-10-29 MED ORDER — ACETAMINOPHEN 500 MG PO TABS
500.0000 mg | ORAL_TABLET | Freq: Three times a day (TID) | ORAL | Status: DC
  Administered 2023-10-29 – 2023-11-04 (×13): 500 mg via ORAL
  Filled 2023-10-29 (×20): qty 1

## 2023-10-29 MED ORDER — VENLAFAXINE HCL ER 75 MG PO CP24
75.0000 mg | ORAL_CAPSULE | Freq: Every day | ORAL | Status: DC
  Administered 2023-10-30 – 2023-11-05 (×7): 75 mg via ORAL
  Filled 2023-10-29 (×7): qty 1

## 2023-10-29 MED ORDER — OXYCODONE HCL 5 MG PO TABS: 5.0000 mg | ORAL_TABLET | Freq: Four times a day (QID) | ORAL | Status: DC | PRN

## 2023-10-29 MED ORDER — SPIRONOLACTONE 25 MG PO TABS
25.0000 mg | ORAL_TABLET | Freq: Every day | ORAL | Status: DC
  Administered 2023-10-29 – 2023-10-30 (×2): 25 mg via ORAL
  Filled 2023-10-29 (×2): qty 1

## 2023-10-29 MED ORDER — MELATONIN 5 MG PO TABS
5.0000 mg | ORAL_TABLET | Freq: Every evening | ORAL | Status: DC | PRN
  Administered 2023-10-29: 5 mg via ORAL
  Filled 2023-10-29: qty 1

## 2023-10-29 MED ORDER — AMIODARONE HCL IN DEXTROSE 360-4.14 MG/200ML-% IV SOLN
30.0000 mg/h | INTRAVENOUS | Status: DC
  Administered 2023-10-29 – 2023-10-30 (×3): 30 mg/h via INTRAVENOUS
  Filled 2023-10-29 (×3): qty 200

## 2023-10-29 MED ORDER — MAGNESIUM SULFATE 4 GM/100ML IV SOLN
4.0000 g | Freq: Once | INTRAVENOUS | Status: AC
  Administered 2023-10-29: 4 g via INTRAVENOUS
  Filled 2023-10-29: qty 100

## 2023-10-29 MED ORDER — VITAMIN D (ERGOCALCIFEROL) 1.25 MG (50000 UNIT) PO CAPS
50000.0000 [IU] | ORAL_CAPSULE | ORAL | Status: DC
  Administered 2023-10-29: 50000 [IU] via ORAL
  Filled 2023-10-29 (×2): qty 1

## 2023-10-29 MED ORDER — OXYCODONE HCL 5 MG PO TABS
5.0000 mg | ORAL_TABLET | Freq: Four times a day (QID) | ORAL | Status: AC
  Administered 2023-10-29 (×2): 5 mg via ORAL
  Filled 2023-10-29 (×2): qty 1

## 2023-10-29 MED ORDER — VENLAFAXINE HCL ER 37.5 MG PO CP24
37.5000 mg | ORAL_CAPSULE | ORAL | Status: AC
  Administered 2023-10-29: 37.5 mg via ORAL
  Filled 2023-10-29: qty 1

## 2023-10-29 NOTE — Evaluation (Signed)
 Occupational Therapy Evaluation Patient Details Name: Paul Goodman MRN: [id number] DOB: [birth year] Today's Date: 10/29/2023   History of Present Illness   Pt is a 78 y.o. male who presents with abnormal lab with hyperglycemia and hyperkalemia. Admitted for A-fib RVR and fall. PMH of HTN, HLD, DM, COPD, sCHF with EF < 20%, stroke, CKD 3, BPH, A-fib on Eliquis , OSA on CPAP, obesity, depression with anxiety.     Clinical Impressions Pt was seen for OT evaluation this date. PTA, pt resides at home with his wife in a 1 level home with 5 STE and bil HR. Reports he is IND at baseline with all tasks, grocery shops, drives, does yardwork, etc. Reports no AD use and admits to having 2 recent falls. Reports over the last few weeks he has been staying at home d/t his limited endurance.  Pt presents to acute OT demonstrating impaired ADL performance and functional mobility 2/2 weakness and poor activity tolerance. Pt is found seated at EOB reporting he needs to use the bathroom. STS from EOB with CGA/SBA and ambulated to the bathroom without AD, no LOB but increased WOB with all activity. Pt on RA throughout session with sp02 >91%. All aspects of toileting performed with CGA/SBA including transfer. HR up to 115 at most. LB dressing with supervision seated EOB. Edu pt on ECS, pacing, task simplification and use of AE/AD to prevent overexertion. He returned to supine and was placed back on 1L with improvement to 95% sp02 for comfort.  Pt would benefit from skilled OT services to address noted impairments and functional limitations to maximize safety and independence while minimizing falls risk and caregiver burden. Do anticipate the need for follow up OT services upon acute hospital DC.      If plan is discharge home, recommend the following:   A little help with walking and/or transfers;A little help with bathing/dressing/bathroom;Help with stairs or ramp for entrance;Assistance with  cooking/housework     Functional Status Assessment   Patient has had a recent decline in their functional status and demonstrates the ability to make significant improvements in function in a reasonable and predictable amount of time.     Equipment Recommendations   None recommended by OT     Recommendations for Other Services         Precautions/Restrictions   Precautions Precautions: None Recall of Precautions/Restrictions: Intact Restrictions Weight Bearing Restrictions Per Provider Order: No     Mobility Bed Mobility Overal bed mobility: Needs Assistance Bed Mobility: Sit to Supine       Sit to supine: Modified independent (Device/Increase time)   General bed mobility comments: found seated at EOB on arrival, returned to supine with MOD I at end of session    Transfers Overall transfer level: Needs assistance Equipment used: None Transfers: Sit to/from Stand Sit to Stand: Supervision, Contact guard assist           General transfer comment: SBA for STS from EOB, ambulates in room with CGA/SBA, fatigues easily but no LOB noted, pt reaching for door jam x1      Balance Overall balance assessment: Mild deficits observed, not formally tested                                         ADL either performed or assessed with clinical judgement   ADL Overall ADL's : Needs assistance/impaired  Grooming: Wash/dry hands;Standing;Contact guard assist;Supervision/safety               Lower Body Dressing: Supervision/safety;Sitting/lateral leans Lower Body Dressing Details (indicate cue type and reason): to don shoes Toilet Transfer: Supervision/safety;Regular Toilet;Grab bars   Toileting- Clothing Manipulation and Hygiene: Sitting/lateral lean;Supervision/safety;Sit to/from stand Toileting - Clothing Manipulation Details (indicate cue type and reason): manage underwear and peri-care     Functional mobility during ADLs:  Supervision/safety;Contact guard assist       Vision         Perception         Praxis         Pertinent Vitals/Pain Pain Assessment Pain Assessment: No/denies pain     Extremity/Trunk Assessment Upper Extremity Assessment Upper Extremity Assessment: Overall WFL for tasks assessed   Lower Extremity Assessment Lower Extremity Assessment: Generalized weakness       Communication Communication Communication: No apparent difficulties   Cognition Arousal: Alert Behavior During Therapy: WFL for tasks assessed/performed Cognition: No apparent impairments                               Following commands: Intact       Cueing  General Comments      found on RA with sp02 at 95-97% throughout session, HR up to 115 at most, desat with return to supine to 91% with pt reporting increased DOE/SOB, placed on 1L with improvement to 95%-nurse notified   Exercises Other Exercises Other Exercises: Edu on role of OT in acute setting, as well as ECS, pacing, and task simplication and use of AE/AD to minimize work.   Shoulder Instructions      Home Living Family/patient expects to be discharged to:: Private residence Living Arrangements: Spouse/significant other Available Help at Discharge: Family;Available 24 hours/day Type of Home: House Home Access: Stairs to enter [employer] of Steps: 5 Entrance Stairs-Rails: Right;Left;Can reach both Home Layout: One level     Bathroom Shower/Tub: Producer, television/film/video: Handicapped height     Home Equipment: Agricultural consultant (2 wheels);BSC/3in1;Shower seat - built in   Additional Comments: AE/AD is his wife's      Prior Functioning/Environment Prior Level of Function : Independent/Modified Independent;Driving;History of Falls (last six months)             Mobility Comments: 2 falls recently, no AD use, drives, grocery shops, does yard work; has been staying in the last few weeks ADLs  Comments: IND    OT Problem List: Decreased strength;Decreased activity tolerance   OT Treatment/Interventions: Self-care/ADL training;Therapeutic exercise;Therapeutic activities;Patient/family education;DME and/or AE instruction;Balance training;Energy conservation      OT Goals(Current goals can be found in the care plan section)   Acute Rehab OT Goals Patient Stated Goal: return home OT Goal Formulation: With patient Time For Goal Achievement: 11/12/23 Potential to Achieve Goals: Good ADL Goals Pt Will Perform Lower Body Dressing: with modified independence;sit to/from stand;sitting/lateral leans Pt Will Transfer to Toilet: with modified independence;ambulating;regular height toilet Additional ADL Goal #1: Pt will demo 1 learned ECS during ADL performance 2/2 trials to maximize safety/IND and prevent overexertion.   OT Frequency:  Min 2X/week    Co-evaluation              AM-PAC OT 6 Clicks Daily Activity     Outcome Measure Help from another person eating meals?: None Help from another person taking care of personal grooming?: None Help from another  person toileting, which includes using toliet, bedpan, or urinal?: A Little Help from another person bathing (including washing, rinsing, drying)?: A Little Help from another person to put on and taking off regular upper body clothing?: None Help from another person to put on and taking off regular lower body clothing?: A Little 6 Click Score: 21   End of Session Equipment Utilized During Treatment: Oxygen Nurse Communication: Mobility status  Activity Tolerance: Patient tolerated treatment well;Patient limited by fatigue Patient left: in bed;with call bell/phone within reach;with bed alarm set  OT Visit Diagnosis: Other abnormalities of gait and mobility (R26.89);Repeated falls (R29.6)                Time: 8944-8884 OT Time Calculation (min): 20 min Charges:  OT General Charges $OT Visit: 1 Visit OT  Evaluation $OT Eval Low Complexity: 1 Low Elby Blackwelder, OTR/L  10/29/23, 12:36 PM  Romaine Neville E Trevia Nop 10/29/2023, 12:32 PM
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
## 2023-10-29 NOTE — Telephone Encounter (Signed)
 Patient Product/process development scientist completed.    The patient is insured through [employer]. Patient has [employer], may use a copay card, and/or apply for patient assistance if available.    Ran test claim for Farxiga  10 mg and Requires Prior Authorization   This test claim was processed through [facility]- copay amounts may vary at other pharmacies due to [employer], or as the patient moves through the different stages of their insurance plan.     Reyes Sharps, CPHT Pharmacy Technician III Certified Patient Advocate [facility] Patient Advocate Team Direct Number: [phone]  Fax: [phone]

## 2023-10-29 NOTE — Consult Note (Signed)
 [facility] Face-to-Face Psychiatry Consult   Reason for Consult:   depression Referring Physician:  STAFFORD, PHILLIP  Patient Identification: Paul Goodman MRN:  [id number] Principal Diagnosis: Atrial fibrillation with RVR (HCC) Diagnosis:  Principal Problem:   Atrial fibrillation with RVR (HCC) Active Problems:   COPD (chronic obstructive pulmonary disease) (HCC)   Hypertension   OSA on CPAP   Type II diabetes mellitus with renal manifestations (HCC)   Hyperlipidemia   Chronic systolic CHF (congestive heart failure) (HCC)   Depression with anxiety   Acute renal failure superimposed on stage 2 chronic kidney disease (HCC)   Obesity (BMI 30-39.9)   Stroke [facility])   Fall at home, initial encounter    Subjective:   Paul Goodman is a 78 y.o. male patient admitted  to the hospitalist service with hyperglycemia and hyperkalemia.  Psychiatric was consulted for depression.  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  Patient reports feeling depressed leading to psych consult.    Patient on interview was noted to falling sleep mid conversation.  Patient apologized for falling asleep and wake up to answer questions.  Patient was alert oriented  to time place and person.  Reports that he has been feeling depressed for quite some time and mostly it is secondary to his medical issues. Patient reports that he is short of breath which is making him depressed.  He  reports that he has lost faith in the medicine and is trying to get to alternative holistic medication approach to help him. Patient denies any previous psychiatric admission.  Reports that he had seen psychiatrist long time back in 1987 only once for anger issues.  He was not prescribed any medication at that time.  Patient denies any current suicidal thoughts or intentions or plan.  Denies any previous suicide attempt.  Patient denies any homicidal ideation or intention or plan.  Patient denies any current or past auditory or visual hallucinations.      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    Patient showed interest in talking about his Personnel officer.   Past Psychiatric History:   No past psychiatric diagnosis.   No current outpatient psychiatrist.   No current outpatient therapist.   No previous suicide attempt.   Patient denied that he is not taking any psychiatric medication.   Per chart review patient was prescribed Paxil  in 2015.  Patient was not taking that medication it was restarted during this hospitalization.    Family history No significant family history for psychiatric issues reported.    Social history  Married,  2 kids Spiritual.   No access to guns. [employer] veteran, E6, honorable discharge   Drugs  Denies drinking alcohol.    Patient reports that he makes alcohol but does not drink.  Talking about the moonshine Denies any marijuana.   Denies smoking cigarettes reports he quit  Denies any cocaine, heroin, bath salts or any other drugs.  Risk to Self:  Risk to Others:   Prior Inpatient Therapy:   Prior Outpatient Therapy:    Past Medical History:  Past Medical History:  Diagnosis Date   Anxiety    Arthritis    CHF (congestive heart failure) (HCC)    COPD (chronic obstructive pulmonary disease) (HCC)    Diabetes (HCC)    ED (erectile dysfunction)    Heart murmur    Hyperlipidemia    Hypertension    Melanoma (HCC)     Sleep apnea     Past Surgical History:  Procedure Laterality Date   EYE SURGERY Right    cancer   HAND SURGERY Left 1966   KNEE SURGERY Left 1973   RIGHT/LEFT HEART CATH AND CORONARY ANGIOGRAPHY Bilateral 04/29/2023   Procedure: RIGHT/LEFT HEART CATH AND CORONARY ANGIOGRAPHY;  Surgeon: Florencio Cara BIRCH, MD;  Location: [facility] INVASIVE CV LAB;  Service: Cardiovascular;  Laterality: Bilateral;   TONSILLECTOMY  1964   Family History:  Family History  Problem Relation Age of Onset   Kidney Stones Father    Kidney disease Neg Hx    Prostate cancer Neg Hx    Kidney cancer Neg Hx    Bladder Cancer Neg Hx     Social History:  Social History   Substance and Sexual Activity  Alcohol Use Not Currently   Alcohol/week: 14.0 standard drinks of alcohol   Types: 14 Standard drinks or equivalent per week     Social History   Substance and Sexual Activity  Drug Use Not Currently   Types: Marijuana    Social History   Socioeconomic History   Marital status: Married    Spouse name: Not on file   Number of children: Not on file   Years of education: Not on file   Highest education level: Not on file  Occupational History   Not on file  Tobacco Use   Smoking status: Former    Types: Cigarettes   Smokeless tobacco: Former    Types: Chew   Tobacco comments:    quit 1992  Vaping Use   Vaping status: Never Used  Substance and Sexual Activity   Alcohol use: Not Currently    Alcohol/week: 14.0 standard drinks of alcohol    Types: 14 Standard drinks or equivalent per week   Drug use: Not Currently    Types: Marijuana   Sexual activity: Not Currently  Other Topics Concern   Not on file  Social History Narrative   Not on file   Social Drivers of Health   Financial Resource Strain: Medium Risk (09/17/2023)   Received from [facility]   Overall Financial Resource Strain (CARDIA)    Difficulty of Paying Living Expenses: Somewhat hard  Food Insecurity: Food  Insecurity Present (10/28/2023)   Hunger Vital Sign    Worried About Running Out of Food in the Last Year: Sometimes true    Ran Out of Food in the Last Year: Sometimes true  Transportation Needs: No Transportation Needs (10/28/2023)   PRAPARE - Administrator, Civil Service (Medical): No    Lack of Transportation (Non-Medical): No  Physical Activity: Not on file  Stress: Not on file  Social Connections: Socially Integrated (10/28/2023)   Social Connection and Isolation Panel    Frequency of Communication with Friends and Family: Three times a week    Frequency of Social Gatherings with Friends and Family: Three times a week    Attends Religious Services: 1 to 4 times per year  Active Member of Clubs or Organizations: Yes    Attends Banker Meetings: 1 to 4 times per year    Marital Status: Married   Additional Social History:    Allergies:  No Known Allergies  Labs:  Results for orders placed or performed during the hospital encounter of 10/28/23 (from the past 48 hours)  CBG monitoring, ED     Status: Abnormal   Collection Time: 10/28/23  4:40 PM  Result Value Ref Range   Glucose-Capillary 574 (HH) 70 - 99 mg/dL    Comment: Glucose reference range applies only to samples taken after fasting for at least 8 hours.   Comment 1 Notify RN   Basic metabolic panel     Status: Abnormal   Collection Time: 10/28/23  4:49 PM  Result Value Ref Range   Sodium 131 (L) 135 - 145 mmol/L   Potassium 4.8 3.5 - 5.1 mmol/L   Chloride 100 98 - 111 mmol/L   CO2 20 (L) 22 - 32 mmol/L   Glucose, Bld 543 (HH) 70 - 99 mg/dL    Comment: CRITICAL RESULT CALLED TO, READ BACK BY AND VERIFIED WITH HANNAH KEIMIG 10/28/23 1719 MU Glucose reference range applies only to samples taken after fasting for at least 8 hours.    BUN 34 (H) 8 - 23 mg/dL   Creatinine, Ser 8.69 (H) 0.61 - 1.24 mg/dL   Calcium  8.7 (L) 8.9 - 10.3 mg/dL   GFR, Estimated 57 (L) >60 mL/min    Comment:  (NOTE) Calculated using the CKD-EPI Creatinine Equation (2021)    Anion gap 11 5 - 15    Comment: Performed at [facility], [address] [place], [place] [address]  CBC     Status: None   Collection Time: 10/28/23  4:49 PM  Result Value Ref Range   WBC 8.6 4.0 - 10.5 K/uL   RBC 4.96 4.22 - 5.81 MIL/uL   Hemoglobin 14.6 13.0 - 17.0 g/dL   HCT 57.3 60.9 - 47.9 %   MCV 85.9 80.0 - 100.0 fL   MCH 29.4 26.0 - 34.0 pg   MCHC 34.3 30.0 - 36.0 g/dL   RDW 87.0 88.4 - 84.4 %   Platelets 221 150 - 400 K/uL   nRBC 0.0 0.0 - 0.2 %    Comment: Performed at [facility], [address], [place], [place] [address]  Brain natriuretic peptide     Status: Abnormal   Collection Time: 10/28/23  4:49 PM  Result Value Ref Range   B Natriuretic Peptide 987.7 (H) 0.0 - 100.0 pg/mL    Comment: Performed at [facility], [address] [place], [place] [address]  TSH     Status: None   Collection Time: 10/28/23  4:49 PM  Result Value Ref Range   TSH 1.812 0.350 - 4.500 uIU/mL    Comment: Performed by a 3rd Generation assay with a functional sensitivity of <=0.01 uIU/mL. Performed at [facility], [address] [place], [place] [address]   Urinalysis, Routine w reflex microscopic -Urine, Clean Catch     Status: Abnormal   Collection Time: 10/28/23  6:34 PM  Result Value Ref Range   Color, Urine YELLOW (A) YELLOW   APPearance CLEAR (A) CLEAR   Specific Gravity, Urine 1.030 1.005 - 1.030   pH 5.0 5.0 - 8.0   Glucose, UA >=500 (A) NEGATIVE mg/dL   Hgb urine dipstick NEGATIVE NEGATIVE   Bilirubin Urine NEGATIVE NEGATIVE  Ketones, ur NEGATIVE NEGATIVE mg/dL   Protein, ur NEGATIVE NEGATIVE mg/dL   Nitrite NEGATIVE NEGATIVE   Leukocytes,Ua NEGATIVE NEGATIVE   RBC / HPF 0-5 0 - 5 RBC/hpf   WBC, UA 0 0 - 5 WBC/hpf   Bacteria, UA NONE SEEN NONE SEEN   Squamous Epithelial / HPF 0 0 - 5 /HPF    Comment: Performed at [facility], [address] [place], [place] [address]  Glucose, capillary     Status: Abnormal   Collection Time: 10/28/23  8:25 PM  Result Value Ref Range   Glucose-Capillary 379 (H) 70 - 99 mg/dL    Comment: Glucose reference range applies only to samples taken after fasting for at least 8 hours.  Basic metabolic panel     Status: Abnormal   Collection Time: 10/29/23  5:16 AM  Result Value Ref Range   Sodium 136 135 - 145 mmol/L   Potassium 3.9 3.5 - 5.1 mmol/L   Chloride 104 98 - 111 mmol/L   CO2 22 22 - 32 mmol/L   Glucose, Bld 203 (H) 70 - 99 mg/dL    Comment: Glucose reference range applies only to samples taken after fasting for at least 8 hours.   BUN 31 (H) 8 - 23 mg/dL   Creatinine, Ser 9.00 0.61 - 1.24 mg/dL   Calcium  8.3 (L) 8.9 - 10.3 mg/dL   GFR, Estimated >39 >39 mL/min    Comment: (NOTE) Calculated using the CKD-EPI Creatinine Equation (2021)    Anion gap 10 5 - 15    Comment: Performed at [facility], [address] [place], [place] [address]  CBC     Status: None   Collection Time: 10/29/23  5:16 AM  Result Value Ref Range   WBC 7.9 4.0 - 10.5 K/uL   RBC 4.81 4.22 - 5.81 MIL/uL   Hemoglobin 14.1 13.0 - 17.0 g/dL   HCT 58.6 60.9 - 47.9 %   MCV 85.9 80.0 - 100.0 fL   MCH 29.3 26.0 - 34.0 pg   MCHC 34.1 30.0 - 36.0 g/dL   RDW 86.9 88.4 - 84.4 %   Platelets 202 150 - 400 K/uL   nRBC 0.0 0.0 - 0.2 %    Comment: Performed at [facility], [address] [place], [place] [address]  Hemoglobin A1c     Status: Abnormal   Collection Time: 10/29/23  5:16 AM  Result Value Ref Range   Hgb A1c MFr Bld 12.8 (H) 4.8 - 5.6 %    Comment: (NOTE) Diagnosis of Diabetes The following HbA1c ranges recommended by the American Diabetes Association (ADA) may be used as an aid in the diagnosis of diabetes mellitus.  Hemoglobin             Suggested A1C NGSP%              Diagnosis  <5.7                   Non Diabetic  5.7-6.4                Pre-Diabetic  >6.4                    Diabetic  <7.0                   Glycemic control for  adults with diabetes.     Mean Plasma Glucose 320.66 mg/dL    Comment: Performed at [facility], [address] [place], [place] [address]  Glucose, capillary     Status: Abnormal   Collection Time: 10/29/23  7:36 AM  Result Value Ref Range   Glucose-Capillary 230 (H) 70 - 99 mg/dL    Comment: Glucose reference range applies only to samples taken after fasting for at least 8 hours.  Magnesium      Status: None   Collection Time: 10/29/23  9:02 AM  Result Value Ref Range   Magnesium  2.0 1.7 - 2.4 mg/dL    Comment: Performed at [facility], [address] [place], [place] [address]  Phosphorus     Status: None   Collection Time: 10/29/23  9:02 AM  Result Value Ref Range   Phosphorus 4.0 2.5 - 4.6 mg/dL    Comment: Performed at [facility], [address] [place], [place] [address]  Glucose, capillary     Status: Abnormal   Collection Time: 10/29/23 11:48 AM  Result Value Ref Range   Glucose-Capillary 367 (H) 70 - 99 mg/dL    Comment: Glucose reference range applies only to samples taken after fasting for at least 8 hours.    Current Facility-Administered Medications  Medication Dose Route Frequency Provider Last Rate Last Admin   acetaminophen  (TYLENOL ) tablet 650 mg  650 mg Oral Q6H PRN Niu, Xilin, MD   650 mg at 10/28/23 2128   albuterol  (PROVENTIL ) (2.5 MG/3ML) 0.083% nebulizer solution 2.5 mg  2.5 mg Inhalation Q4H PRN Niu, Xilin, MD       amiodarone  (NEXTERONE  PREMIX) 360-4.14 MG/200ML-% (1.8 mg/mL) IV infusion  30 mg/hr Intravenous Continuous Niu, Xilin, MD 16.67 mL/hr at 10/29/23 0811 30 mg/hr at 10/29/23 9188   amiodarone  (PACERONE ) tablet 200 mg  200 mg Oral Daily Niu, Xilin, MD   200 mg at 10/29/23 9192   apixaban  (ELIQUIS ) tablet 5 mg  5 mg Oral BID Niu, Xilin, MD   5 mg at 10/29/23 9192   carvedilol  (COREG ) tablet 25 mg  25 mg Oral BID WC Niu, Xilin, MD   25 mg at  10/29/23 9192   dextromethorphan -guaiFENesin  (MUCINEX  DM) 30-600 MG per 12 hr tablet 1 tablet  1 tablet Oral BID PRN Niu, Xilin, MD   1 tablet at 10/29/23 1248   fenofibrate  tablet 160 mg  160 mg Oral Daily Niu, Xilin, MD   160 mg at 10/29/23 1006   hydrALAZINE  (APRESOLINE ) injection 5 mg  5 mg Intravenous Q2H PRN Niu, Xilin, MD       insulin  aspart (novoLOG ) injection 0-5 Units  0-5 Units Subcutaneous QHS Niu, Xilin, MD   5 Units at 10/28/23 2123   insulin  aspart (novoLOG ) injection 0-9 Units  0-9 Units Subcutaneous TID WC Niu, Xilin, MD   9 Units at 10/29/23 1210   insulin  glargine-yfgn (SEMGLEE ) injection 45 Units  45 Units Subcutaneous Daily Niu, Xilin, MD   45 Units at 10/29/23 1006   LORazepam  (ATIVAN ) tablet 0.5-1 mg  0.5-1 mg Oral Daily PRN Niu, Xilin, MD   1 mg at 10/28/23 2355   magnesium  sulfate IVPB 4 g 100 mL  4 g Intravenous Once Decoste, Gabriella, PA-C       ondansetron  (ZOFRAN ) injection 4 mg  4 mg Intravenous Q8H PRN Niu, Xilin, MD       rOPINIRole  (REQUIP  XL) 24 hr tablet 12 mg  12 mg Oral QHS Niu, Xilin, MD  12 mg at 10/29/23 0002   rosuvastatin  (CRESTOR ) tablet 5 mg  5 mg Oral Daily Niu, Xilin, MD   5 mg at 10/29/23 1335   spironolactone  (ALDACTONE ) tablet 25 mg  25 mg Oral Daily Decoste, Gabriella, PA-C   25 mg at 10/29/23 1210   venlafaxine XR (EFFEXOR-XR) 24 hr capsule 37.5 mg  37.5 mg Oral NOW Krystol Rocco, MD       [START ON 10/30/2023] venlafaxine XR (EFFEXOR-XR) 24 hr capsule 75 mg  75 mg Oral Q breakfast Joeseph Verville, MD        Musculoskeletal: Strength & Muscle Tone: decreased    Psychiatric Specialty Exam:   Mental status exam - Appearance- casual grooming byrd. Appears stated age.  In hospital clothes.  Alert and oriented x3-  often falling asleep while talking Behavior - cooperative.   In slight distress secondary to the breathing issues. Motor activity-No psychomotor agitation or retardation noted. Speech - normal rate rhythm volume  and tone. [place] -   Interrupted secondary to the breathing issues Mood-  " not good" Affect -  depressed, congruent with reported mood Thought Perception-No auditory and visual hallucinations. Does not appear to be responding to internal stimuli . Thought content -within normal limits.  No suicidal or homicidal thoughts. Thought process -logical and linear.  Association intact . Memory -intact . Fund of knowledge -intact . Attention -intact . Insight and judgment fair. Estimated Level of intellectual functioning-average. Estimated level of functioning-average.   Physical Exam: Physical Exam ROS Blood pressure 107/80, pulse 98, temperature 97.7 F (36.5 C), temperature source Oral, resp. rate 13, height 6' 2 (1.88 m), weight 111.3 kg, SpO2 95%. Body mass index is 31.51 kg/m.  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 Up titrate to 75 mg tomorrow.  Patient reports that he was not on Paxil .  Historically patient was on Paxil  per chart review in 2015 which was started this hospitalization.  Will discontinue Paxil .  No need of sitter  from psychiatric point of view. Pt does not meet the IVC criteria. Psychiatry will continue to follow along.    Medical - defer to the primary team  Thanks for consult.   Disposition: No evidence of imminent risk to self or others at present.   Patient does not meet criteria for psychiatric inpatient admission.  Matyas Baisley, MD 10/29/2023 2:46 PM

## 2023-10-29 NOTE — Consult Note (Addendum)
 [facility] CLINIC CARDIOLOGY CONSULT NOTE       Patient ID: Paul Goodman MRN: [id number] DOB/AGE: 1945-06-12 78 y.o.  Admit date: 10/28/2023 Referring Physician Dr. Von Primary Physician Valora Agent, MD Primary Cardiologist Dr. Marshia Blanch Reason for Consultation AF RVR  HPI: Paul Goodman is a 78 y.o. male  with a past medical history of permanent atrial fibrillation (on Eliquis ), chronic HFrEF (EF < 20%), nonischemic cardiomyopathy, pulmonary hypertension, hyperlipidemia, hypertension, history CVA, diabetes mellitus, OSA (on CPAP), obesity who presented to the ED on 10/28/2023 sent by pulmonology due to extremely elevated blood glucose and hyperkalemia.  Upon admission EKG revealed atrial fibrillation RVR with rates in the 140s. Cardiology was consulted for further evaluation.   Patient presented to the ED after being sent by outpatient pulmonology due to hyperglycemia and hyperkalemia.  Patient's blood glucose was above 500 and potassium was 5.6. Work up in the ED notable for sodium 131, potassium 4.8, creatinine 1.3, blood glucose 574, hemoglobin 14.6, platelets 221.  TSH within normal limits.  CT head with chronic right frontoparietal infarct, no acute abnormalities noted.  BNP elevated at 990.  EKG in ED with atrial fibrillation rate 131 bpm.  Patient started on IV Amio infusion.  At the time of my evaluation this afternoon, patient was sitting up on side of hospital bed.  We discussed patient's symptoms in further detail.  Patient states he was sent to ED by outpatient pulmonology and he did not want to come to the hospital.  Patient states he has not been feeling well for a long time.  He says he's been having worsening shortness of breath, lower extremity edema, orthopnea, cough for many years.  Patient states that the medications that are prescribed for him do not work and he does not want to take medications that do not work.  Patient states he is only been taking 2  cardiac medications but is unable to tell me which ones.  Patient states he has not been taking his Lasix at home.  After discussion about heart failure medications patient states he is willing to take these in the hospital.  Patient denies any chest pain or palpitations.   Pertinent Cardiac History (Most recent) RHC/LHC (04/29/2023)   There is severe left ventricular systolic dysfunction.   LV end diastolic pressure is mildly elevated.   The left ventricular ejection fraction is less than 25% by visual estimate.   Hemodynamic findings consistent with pulmonary hypertension.   No indication for antiplatelet therapy at this time .  Right heart -Wedge mean of 20 -PA mean 24   Left ventriculogram -Depressed left ventricular function globally left ventricular enlargement EF of around 25%   Coronaries  -Normal coronaries left dominant system  -Study consistent with a nonischemic cardiomyopathy  Review of systems complete and found to be negative unless listed above    Past Medical History:  Diagnosis Date   Anxiety    Arthritis    CHF (congestive heart failure) (HCC)    COPD (chronic obstructive pulmonary disease) (HCC)    Diabetes (HCC)    ED (erectile dysfunction)    Heart murmur    Hyperlipidemia    Hypertension    Melanoma (HCC)    Sleep apnea     Past Surgical History:  Procedure Laterality Date   EYE SURGERY Right    cancer   HAND SURGERY Left 1966   KNEE SURGERY Left 1973   RIGHT/LEFT HEART CATH AND CORONARY ANGIOGRAPHY Bilateral 04/29/2023   Procedure:  RIGHT/LEFT HEART CATH AND CORONARY ANGIOGRAPHY;  Surgeon: Florencio Cara BIRCH, MD;  Location: [facility] INVASIVE CV LAB;  Service: Cardiovascular;  Laterality: Bilateral;   TONSILLECTOMY  1964    Medications Prior to Admission  Medication Sig Dispense Refill Last Dose/Taking   albuterol  (PROVENTIL  HFA;VENTOLIN  HFA) 108 (90 BASE) MCG/ACT inhaler Inhale 2 puffs into the lungs every 6 (six) hours as needed for shortness of  breath.      amiodarone  (PACERONE ) 200 MG tablet Take 1 tablet (200 mg total) by mouth 2 (two) times daily. (Patient taking differently: Take 200 mg by mouth daily.) 60 tablet 2    amLODipine (NORVASC) 10 MG tablet Take 10 mg by mouth daily.      apixaban  (ELIQUIS ) 5 MG TABS tablet Take 1 tablet (5 mg total) by mouth 2 (two) times daily. 60 tablet 3    carvedilol  (COREG ) 25 MG tablet Take 1 tablet (25 mg total) by mouth 2 (two) times daily with a meal. 60 tablet 3    Chromium Picolinate (CHROMIUM PICOLATE PO) Take by mouth.      dextromethorphan -guaiFENesin  (MUCINEX  DM) 30-600 MG 12hr tablet Take 1 tablet by mouth every evening.      fenofibrate  160 MG tablet Take 160 mg by mouth daily.      furosemide (LASIX) 20 MG tablet Take 20 mg by mouth daily as needed.      hydrochlorothiazide  (HYDRODIURIL ) 25 MG tablet Take 25 mg by mouth daily.      Insulin  Glargine (BASAGLAR  KWIKPEN) 100 UNIT/ML Inject 60 Units into the skin daily.      liraglutide (VICTOZA) 18 MG/3ML SOPN Inject 1.2 mg into the skin once a week.      LORazepam  (ATIVAN ) 1 MG tablet Take 0.5-1 mg by mouth daily as needed. TAKE ONE-HALF (1/2) TO ONE (1) TABLET BY MOUTH DAILY AS NEEDED FOR ANXIETY, PANIC, OR THROAT SWELLING (Patient not taking: Reported on 04/29/2023)      losartan  (COZAAR ) 100 MG tablet Take 100 mg by mouth daily.      PARoxetine  (PAXIL ) 20 MG tablet Take 20 mg by mouth daily.      pramipexole (MIRAPEX) 0.125 MG tablet TAKE 1 TABLET BY MOUTH 2 HOURS BEFORE BEDTIME, MAY TITRATE BY 1 TABLET PER WEEK AS NEEDED MAXIMUM 4 TABLETS. (Patient not taking: Reported on 04/29/2023)      rOPINIRole  (REQUIP  XL) 4 MG 24 hr tablet Take 12-15 mg by mouth at bedtime.      rosuvastatin  (CRESTOR ) 5 MG tablet Take 5 mg by mouth daily.      spironolactone  (ALDACTONE ) 25 MG tablet Take 0.5 tablets (12.5 mg total) by mouth daily. (Patient taking differently: Take 25 mg by mouth 2 (two) times daily.) 30 tablet 3    VANADIUM PO Take by mouth.       Zinc  Sulfate (ZINC  15 PO) Take by mouth.      Social History   Socioeconomic History   Marital status: Married    Spouse name: Not on file   Number of children: Not on file   Years of education: Not on file   Highest education level: Not on file  Occupational History   Not on file  Tobacco Use   Smoking status: Former    Types: Cigarettes   Smokeless tobacco: Former    Types: Chew   Tobacco comments:    quit 1992  Vaping Use   Vaping status: Never Used  Substance and Sexual Activity   Alcohol use: Not Currently  Alcohol/week: 14.0 standard drinks of alcohol    Types: 14 Standard drinks or equivalent per week   Drug use: Not Currently    Types: Marijuana   Sexual activity: Not Currently  Other Topics Concern   Not on file  Social History Narrative   Not on file   Social Drivers of Health   Financial Resource Strain: Medium Risk (09/17/2023)   Received from [facility]   Overall Financial Resource Strain (CARDIA)    Difficulty of Paying Living Expenses: Somewhat hard  Food Insecurity: Food Insecurity Present (10/28/2023)   Hunger Vital Sign    Worried About Running Out of Food in the Last Year: Sometimes true    Ran Out of Food in the Last Year: Sometimes true  Transportation Needs: No Transportation Needs (10/28/2023)   PRAPARE - Administrator, Civil Service (Medical): No    Lack of Transportation (Non-Medical): No  Physical Activity: Not on file  Stress: Not on file  Social Connections: Socially Integrated (10/28/2023)   Social Connection and Isolation Panel    Frequency of Communication with Friends and Family: Three times a week    Frequency of Social Gatherings with Friends and Family: Three times a week    Attends Religious Services: 1 to 4 times per year    Active Member of Clubs or Organizations: Yes    Attends Banker Meetings: 1 to 4 times per year    Marital Status: Married  Catering manager Violence: Not At  Risk (10/28/2023)   Humiliation, Afraid, Rape, and Kick questionnaire    Fear of Current or Ex-Partner: No    Emotionally Abused: No    Physically Abused: No    Sexually Abused: No    Family History  Problem Relation Age of Onset   Kidney Stones Father    Kidney disease Neg Hx    Prostate cancer Neg Hx    Kidney cancer Neg Hx    Bladder Cancer Neg Hx      Vitals:   10/29/23 0600 10/29/23 0737 10/29/23 1143 10/29/23 1152  BP: (!) 139/93 128/84 97/72   Pulse:    83  Resp:    10  Temp:  97.8 F (36.6 C) 97.7 F (36.5 C)   TempSrc:  Oral Oral   SpO2:   95% 98%  Weight:      Height:        PHYSICAL EXAM General: Chronically ill appearing elderly male, well nourished, in no acute distress. HEENT: Normocephalic and atraumatic. Neck: No JVD.   Lungs: Normal respiratory effort on 2L.  Diminished breath sounds bilaterally at bases Heart: Irregularly irregular, elevated rate. Normal S1 and S2 without gallops or murmurs.  Abdomen: Non-distended appearing.  Msk: Normal strength and tone for age. Extremities: Warm and well perfused. No clubbing, cyanosis.  2+ pitting edema L >R.  Neuro: Alert and oriented X 3. Psych: Answers questions appropriately.   Labs: Basic Metabolic Panel: Recent Labs    10/28/23 1649 10/29/23 0516 10/29/23 0902  NA 131* 136  --   K 4.8 3.9  --   CL 100 104  --   CO2 20* 22  --   GLUCOSE 543* 203*  --   BUN 34* 31*  --   CREATININE 1.30* 0.99  --   CALCIUM  8.7* 8.3*  --   MG  --   --  2.0  PHOS  --   --  4.0   Liver Function Tests: No results  for input(s): AST, ALT, ALKPHOS, BILITOT, PROT, ALBUMIN in the last 72 hours. No results for input(s): LIPASE, AMYLASE in the last 72 hours. CBC: Recent Labs    10/28/23 1649 10/29/23 0516  WBC 8.6 7.9  HGB 14.6 14.1  HCT 42.6 41.3  MCV 85.9 85.9  PLT 221 202   Cardiac Enzymes: No results for input(s): CKTOTAL, CKMB, CKMBINDEX, TROPONINIHS in the last 72  hours. BNP: Recent Labs    10/28/23 1649  BNP 987.7*   D-Dimer: No results for input(s): DDIMER in the last 72 hours. Hemoglobin A1C: No results for input(s): HGBA1C in the last 72 hours. Fasting Lipid Panel: No results for input(s): CHOL, HDL, LDLCALC, TRIG, CHOLHDL, LDLDIRECT in the last 72 hours. Thyroid Function Tests: Recent Labs    10/28/23 1649  TSH 1.812   Anemia Panel: No results for input(s): VITAMINB12, FOLATE, FERRITIN, TIBC, IRON, RETICCTPCT in the last 72 hours.   Radiology: CT HEAD WO CONTRAST ( ) Result Date: 10/28/2023 CLINICAL DATA:  Recent syncopal episode EXAM: CT HEAD WITHOUT CONTRAST TECHNIQUE: Contiguous axial images were obtained from the base of the skull through the vertex without intravenous contrast. RADIATION DOSE REDUCTION: This exam was performed according to the departmental dose-optimization program which includes automated exposure control, adjustment of the mA and/or kV according to patient size and/or use of iterative reconstruction technique. COMPARISON:  02/09/2023 FINDINGS: Brain: No evidence of acute infarction, hemorrhage, hydrocephalus, extra-axial collection or mass lesion/mass effect. Remote right frontoparietal infarct is again seen and stable. Vascular calcifications are noted in the centrum semi ovale stable from the prior exam. Basal ganglia calcifications are seen as well. Vascular: No hyperdense vessel or unexpected calcification. Skull: Normal. Negative for fracture or focal lesion. Sinuses/Orbits: No acute finding. Other: None. IMPRESSION: Chronic right frontoparietal infarct.  No acute abnormality noted. Electronically Signed   By: Oneil Devonshire M.D.   On: 10/28/2023 19:47    ECHO ordered  TELEMETRY reviewed by me 10/29/2023: Atrial fibrillation, rate 90s to 100s.  EKG reviewed by me: Atrial fibrillation RVR rate 131 bpm  Data reviewed by me 10/29/2023: last 24h vitals tele labs imaging I/O ED provider  note, admission H&P.  Principal Problem:   Atrial fibrillation with RVR (HCC) Active Problems:   COPD (chronic obstructive pulmonary disease) (HCC)   Hypertension   OSA on CPAP   Type II diabetes mellitus with renal manifestations (HCC)   Hyperlipidemia   Chronic systolic CHF (congestive heart failure) (HCC)   Depression with anxiety   Acute renal failure superimposed on stage 2 chronic kidney disease (HCC)   Obesity (BMI 30-39.9)   Stroke [facility])   Fall at home, initial encounter    ASSESSMENT AND PLAN:  Paul Goodman is a 78 y.o. male  with a past medical history of permanent atrial fibrillation (on Eliquis ), chronic HFrEF (EF < 20%), nonischemic cardiomyopathy, pulmonary hypertension, hyperlipidemia, hypertension history CVA, diabetes mellitus, OSA (on CPAP), obesity who presented to the ED on 10/28/2023 sent by pulmonology due to extremely elevated blood glucose and hyperkalemia.  Upon admission EKG revealed atrial fibrillation RVR with rates in the 140s. Cardiology was consulted for further evaluation.   # Atrial fibrillation RVR # Permanent atrial fibrillation Patient denies chest pain, palpitations. EKG in ED with atrial fibrillation rate 131 bpm. Patient started on IV amio infusion. Per tele in AF with improving HR in 90-100s.  Ordered IV mag 4 g. -Monitor and replenish electrolytes for a goal K >4, Mag >2  -Continue IV amio gtt -  Continue PO amio 200 mg daily.  -Consider adding digoxin for better rate control and improved contractility for reduced EF. -Continue Eliquis  5 mg twice daily for stroke risk reduction. -Continue Coreg  as stated below.  # Acute on chronic HFrEF # Nonischemic cardiomyopathy # Hypertension # Hyperlipidemia # Medication noncompliance Patient with stated noncompliance to medication.  Patient states he does not take his cardiac medications as prescribed at home because he believes the medications do not help him.  Presents with worsening SOB,  orthopnea and LEE. BNP elevated at 990. -Echo ordered.  Further recommendations pending results. -CXR ordered.  -Ordered IV Lasix 80 mg.  Closely monitor renal function and UOP. (Patient prescribed Lasix 20 mg daily at home however states he does not take this.) -Continue Coreg  25 mg twice daily. -Continue spironolactone  25 mg daily. -Continue rosuvastatin  5 mg daily, fenofibrate  160 mg daily. -Consider resuming home losartan  if BP remains elevated. -Plan to initiate dapagliflozin  10 mg daily after discussing with patient (Copay $ 38.08) -There has been brief outpatient discussion about possible defibrillator therapy with Dr. Tobie if patient remains on GDMT and does not show improvement to cardiac function.  This patient's plan of care was discussed and created with Dr. Florencio and he is in agreement.  Signed: Dorene Comfort, PA-C  10/29/2023, 12:51 PM [facility]

## 2023-10-29 NOTE — Plan of Care (Signed)
  Problem: Education: Goal: Ability to describe self-care measures that may prevent or decrease complications (Diabetes Survival Skills Education) will improve Outcome: Progressing   Problem: Education: Goal: Individualized Educational Video(s) Outcome: Progressing   Problem: Fluid Volume: Goal: Ability to maintain a balanced intake and output will improve Outcome: Progressing   Problem: Health Behavior/Discharge Planning: Goal: Ability to identify and utilize available resources and services will improve Outcome: Progressing   Problem: Nutritional: Goal: Maintenance of adequate nutrition will improve Outcome: Progressing

## 2023-10-29 NOTE — Progress Notes (Signed)
 Patient requested something to help him sleep this evening. Dr. Janese, PRN Melatonin 5 mg ordered. Will administer with HS medications.

## 2023-10-29 NOTE — Telephone Encounter (Signed)
 Pharmacy Patient Advocate Encounter  Received notification from [facility] that Prior Authorization for Farxiga  10MG  tablets has been APPROVED from 10/29/2023 to 10/28/2024. Ran test claim, Copay is $38.08. This test claim was processed through [facility]- copay amounts may vary at other pharmacies due to pharmacy/plan contracts, or as the patient moves through the different stages of their insurance plan.   PA #/Case ID/Reference #: [id number]  Key: [id number]

## 2023-10-29 NOTE — Progress Notes (Signed)
 [facility] Progress Note  Patient: Paul Goodman    FMW:[id number]  DOA: 10/28/2023     Date of Service: the patient was seen and examined on 10/29/2023  Chief Complaint  Patient presents with   Hyperglycemia   Brief hospital course: Paul Goodman is a 78 y.o. male with medical history significant of HTN HLD, DM, COPD, sCHF with EF < 20%, stroke, CKD 32, BPH, A-fib on Eliquis , OSA on CPAP, obesity, depression with anxiety, who presents with abnormal lab with hyperglycemia and hyperkalemia.   Pt had a follow-up visit with his pulmonologist in [facility] today, and was found to have hyperglycemia and hyperkalemia.  Blood sugar is above 500 and and potassium is 5.6.  Patient was sent to ED for further evaluation and treatment.  He state that he stopped taking his diabetic medications and some other medications for more than 3 months.  He said he was asked to be evaluated by his physician before giving him more prescription, but he did not have appointment.  He states that sometimes he has heart racing, no chest pain.  He has mild dry cough and mild SOB, polyuria with increased urinary frequency, but no dysuria or burning with urination.SABRA  He states that he fell 2 weeks ago, no LOC. denies any other complaints   Pt is found to have A-fib with RVR, heart rate 110-140s in ED   Data reviewed independently and ED Course:  Vitals; afebrile, BP 136/112, HR 110--140's, RR 18 O2 sat 99% on RA WBC 8.6, BMP: BG 543, anion gap 11, BNP 987.1 worsening renal function with creatinine 1.30, BUN 34 and GFR 57 (recent baseline creatinine 0.92 on 02/09/2023) negative UA, CT of head negative for acute issues, and showed chronic infarction.  Cardiology and psych were consulted [facility] was consulted for admission and further management as below   Assessment and Plan:   # Atrial fibrillation with RVR: HR is 110-140s.  Possibly due to medication noncompliance. - start home amiodarone  200 mg daily and Coreg  25 mg  twice daily - Continue Eliquis  (I discussed with patient, he agreed to restart medications which are necessary for him) - TSH level wnl - Continue amiodarone  drip if heart rate is persistently above 125 (cannot due to Cardizem  drip due to low EF<20%) Cardiology consulted 6/24 magnesium  4 g IV one-time dose given by cardiology  # Chronic systolic CHF (congestive heart failure) (HCC): 2D echo 05/26/2020 showed EF<20%.  elevated BNP 987, significant lower extremity edema, and SOB S/p Lasix 80 mg one-time dose given Resumed Aldactone  25 mg p.o. daily Cardiology following  # Fall at home, initial encounter: CT -head negative for injury - Continue fall precaution - Follow PT/OT eval   # Type II diabetes mellitus with renal manifestations:  Recent A1c 7.7, poorly controlled.  Patient is supposed to take Victoza and glargine insulin  60 units daily, but not taking medications currently.  Blood sugar 543 with normal anion gap 11 6/24 Hemoglobin A1c 12.8 uncontrolled - Start glargine insulin  45 units daily - SSI   # Depression with anxiety -As needed Ativan  6/24 discontinued paroxetine , started Effexor seen by psych   # COPD (chronic obstructive pulmonary disease) (HCC): No wheezing -Bronchodilators as needed Mucinex    # Hypertension: Blood pressure 138/112 -IV hydralazine  as needed - Coreg  25 BID   # Hyperlipidemia: on Crestor  5 mg and started fenofibrate      # Stroke (HCC) -Crestor  - Patient is on Eliquis  for A-fib   # Acute renal failure  superimposed on stage 2 chronic kidney disease:  - Lasix and spironolactone  - Hold HCTZ and Cozaar  - Patient received 1 L normal saline in ED   # Vitamin D insufficiency: started vitamin D 50,000 units p.o. weekly, follow with PCP to repeat vitamin D level after 3 to 6 months.  # OSA: on CPAP  Body mass index is 31.51 kg/m.  Interventions:  Diet: Heart healthy/carb modified, fluid restriction 1.5 L/day DVT Prophylaxis: Eliquis   Advance  goals of care discussion: Full code  Family Communication: family was not present at bedside, at the time of interview.  The pt provided permission to discuss medical plan with the family. Opportunity was given to ask question and all questions were answered satisfactorily.   Disposition:  Pt is from home, admitted with A-fib with RVR, CHF and hyperglycemia, still has CHF and A-fib, which precludes a safe discharge. Discharge to home, when stable, may need few days to improve.  Subjective: No significant events overnight, patient remained in A-fib with RVR, well-controlled now on amiodarone  infusion.  Still has significant lower extremity edema and mild shortness of breath, patient denies any specific complaints, patient seems frustrated secondary to multiple medical problems, seems depressed. Patient stated that he is just tired of hurting.  Physical Exam: General: NAD, lying comfortably Appear in no distress, affect appropriate Eyes: PERRLA ENT: Oral Mucosa Clear, moist  Neck: no JVD,  Cardiovascular: Irregular rhythm, no Murmur,  Respiratory: good respiratory effort, Bilateral Air entry equal and Decreased, no Crackles, no wheezes Abdomen: Bowel Sound present, Soft and no tenderness,  Skin: no rashes Extremities: 4+ BL lower extremity edema, no calf tenderness Neurologic: without any new focal findings Gait not checked due to patient safety concerns  Vitals:   10/29/23 0737 10/29/23 1143 10/29/23 1152 10/29/23 1330  BP: 128/84 97/72  107/80  Pulse:   83 98  Resp:   10 13  Temp: 97.8 F (36.6 C) 97.7 F (36.5 C)    TempSrc: Oral Oral    SpO2:  95% 98% 95%  Weight:      Height:        Intake/Output Summary (Last 24 hours) at 10/29/2023 1537 Last data filed at 10/29/2023 1534 Gross per 24 hour  Intake 103.78 ml  Output 1200 ml  Net -1096.22 ml   Filed Weights   10/28/23 1645 10/28/23 2236 10/29/23 0500  Weight: 106.6 kg 111.3 kg 111.3 kg    Data Reviewed: I have  personally reviewed and interpreted daily labs, tele strips, imagings as discussed above. I reviewed all nursing notes, pharmacy notes, vitals, pertinent old records I have discussed plan of care as described above with RN and patient/family.  CBC: Recent Labs  Lab 10/28/23 1649 10/29/23 0516  WBC 8.6 7.9  HGB 14.6 14.1  HCT 42.6 41.3  MCV 85.9 85.9  PLT 221 202   Basic Metabolic Panel: Recent Labs  Lab 10/28/23 1649 10/29/23 0516 10/29/23 0902  NA 131* 136  --   K 4.8 3.9  --   CL 100 104  --   CO2 20* 22  --   GLUCOSE 543* 203*  --   BUN 34* 31*  --   CREATININE 1.30* 0.99  --   CALCIUM  8.7* 8.3*  --   MG  --   --  2.0  PHOS  --   --  4.0    Studies: CT HEAD WO CONTRAST ( ) Result Date: 10/28/2023 CLINICAL DATA:  Recent syncopal episode EXAM: CT HEAD WITHOUT CONTRAST  TECHNIQUE: Contiguous axial images were obtained from the base of the skull through the vertex without intravenous contrast. RADIATION DOSE REDUCTION: This exam was performed according to the departmental dose-optimization program which includes automated exposure control, adjustment of the mA and/or kV according to patient size and/or use of iterative reconstruction technique. COMPARISON:  02/09/2023 FINDINGS: Brain: No evidence of acute infarction, hemorrhage, hydrocephalus, extra-axial collection or mass lesion/mass effect. Remote right frontoparietal infarct is again seen and stable. Vascular calcifications are noted in the centrum semi ovale stable from the prior exam. Basal ganglia calcifications are seen as well. Vascular: No hyperdense vessel or unexpected calcification. Skull: Normal. Negative for fracture or focal lesion. Sinuses/Orbits: No acute finding. Other: None. IMPRESSION: Chronic right frontoparietal infarct.  No acute abnormality noted. Electronically Signed   By: Oneil Devonshire M.D.   On: 10/28/2023 19:47    Scheduled Meds:  amiodarone   200 mg Oral Daily   apixaban   5 mg Oral BID   carvedilol    25 mg Oral BID WC   fenofibrate   160 mg Oral Daily   insulin  aspart  0-5 Units Subcutaneous QHS   insulin  aspart  0-9 Units Subcutaneous TID WC   insulin  glargine-yfgn  45 Units Subcutaneous Daily   rOPINIRole   12 mg Oral QHS   rosuvastatin   5 mg Oral Daily   spironolactone   25 mg Oral Daily   [START ON 10/30/2023] venlafaxine XR  75 mg Oral Q breakfast   Continuous Infusions:  amiodarone  30 mg/hr (10/29/23 0811)   magnesium  sulfate bolus IVPB 4 g (10/29/23 1530)   PRN Meds: acetaminophen , albuterol , dextromethorphan -guaiFENesin , hydrALAZINE , LORazepam , ondansetron  (ZOFRAN ) IV  Time spent: 55 minutes  Author: ELVAN SOR. MD [facility] 10/29/2023 3:37 PM  To reach On-call, see care teams to locate the attending and reach out to them via [email or web address]. If 7PM-7AM, please contact night-coverage If you still have difficulty reaching the attending provider, please page the [facility] (Director on Call) for [facility] on amion for assistance.

## 2023-10-29 NOTE — Evaluation (Signed)
 Physical Therapy Evaluation Patient Details Name: Paul Goodman MRN: [id number] DOB: 1946-03-21 Today's Date: 10/29/2023  History of Present Illness  Pt is a 78 y.o. male who presents with abnormal lab with hyperglycemia and hyperkalemia. Admitted for A-fib RVR and fall. PMH of HTN, HLD, DM, COPD, sCHF with EF < 20%, stroke, CKD 3, BPH, A-fib on Eliquis , OSA on CPAP, obesity, depression with anxiety.  Clinical Impression  Pt is a pleasant 78 year old male who was admitted for Afib with RVR. Pt performs bed mobility/transfers with CGA and ambulation with supervision and no AD. Pt demonstrates deficits with SOB, strength, and balance. Would benefit from skilled PT to address above deficits and promote optimal return to PLOF. Reports constant dizziness and brain fog in addition to intense coughing spells. Is aggravated with O2, removed O2 for mobility with sats at 97% with all exertion. Pt will continue to receive skilled PT services while admitted and will defer to TOC/care team for updates regarding disposition planning.  Supine BP: 69/56 Sitting: 105/89       If plan is discharge home, recommend the following: A little help with walking and/or transfers   Can travel by private vehicle        Equipment Recommendations None recommended by PT  Recommendations for Other Services       Functional Status Assessment Patient has had a recent decline in their functional status and demonstrates the ability to make significant improvements in function in a reasonable and predictable amount of time.     Precautions / Restrictions Precautions Precautions: Fall Recall of Precautions/Restrictions: Intact Restrictions Weight Bearing Restrictions Per Provider Order: No      Mobility  Bed Mobility Overal bed mobility: Needs Assistance Bed Mobility: Supine to Sit     Supine to sit: Contact guard Sit to supine: Supervision   General bed mobility comments: safe technique with upright  posture.    Transfers Overall transfer level: Needs assistance Equipment used: None Transfers: Sit to/from Stand Sit to Stand: Supervision           General transfer comment: safe technique. Upright posture    Ambulation/Gait Ambulation/Gait assistance: Supervision Gait Distance (Feet): 40 Feet Assistive device: None Gait Pattern/deviations: Step-to pattern       General Gait Details: ambulated in room with some SOB symptoms despite O2 sats at 97% on RA. HR at 104bpm with exertion  Stairs            Wheelchair Mobility     Tilt Bed    Modified Rankin (Stroke Patients Only)       Balance Overall balance assessment: Mild deficits observed, not formally tested                                           Pertinent Vitals/Pain Pain Assessment Pain Assessment: No/denies pain    Home Living Family/patient expects to be discharged to:: Private residence Living Arrangements: Spouse/significant other Available Help at Discharge: Family;Available 24 hours/day Type of Home: House Home Access: Stairs to enter Entrance Stairs-Rails: Right;Left;Can reach both Entrance Stairs-Number of Steps: 5   Home Layout: One level Home Equipment: Agricultural consultant (2 wheels);BSC/3in1;Shower seat - built in Additional Comments: AE/AD is his wife's    Prior Function Prior Level of Function : Independent/Modified Independent;Driving;History of Falls (last six months)             Mobility  Comments: 2 falls recently, no AD use, drives, grocery shops, does yard work; has been staying in the last few weeks ADLs Comments: IND     Extremity/Trunk Assessment   Upper Extremity Assessment Upper Extremity Assessment: Overall WFL for tasks assessed    Lower Extremity Assessment Lower Extremity Assessment: Generalized weakness       Communication   Communication Communication: No apparent difficulties    Cognition Arousal: Alert Behavior During Therapy:  WFL for tasks assessed/performed   PT - Cognitive impairments: No apparent impairments                       PT - Cognition Comments: pleasant and agreeable to sessino Following commands: Intact       Cueing       General Comments General comments (skin integrity, edema, etc.): found on RA with sp02 at 95-97% throughout session, HR up to 115 at most, desat with return to supine to 91% with pt reporting increased DOE/SOB, placed on 1L with improvement to 95%-nurse notified    Exercises     Assessment/Plan    PT Assessment Patient needs continued PT services  PT Problem List Decreased strength;Decreased activity tolerance;Decreased balance;Decreased mobility       PT Treatment Interventions Gait training;DME instruction;Therapeutic activities;Therapeutic exercise;Balance training    PT Goals (Current goals can be found in the Care Plan section)  Acute Rehab PT Goals Patient Stated Goal: to go home PT Goal Formulation: With patient Time For Goal Achievement: 11/12/23 Potential to Achieve Goals: Good    Frequency Min 2X/week     Co-evaluation               AM-PAC PT 6 Clicks Mobility  Outcome Measure Help needed turning from your back to your side while in a flat bed without using bedrails?: None Help needed moving from lying on your back to sitting on the side of a flat bed without using bedrails?: None Help needed moving to and from a bed to a chair (including a wheelchair)?: A Little Help needed standing up from a chair using your arms (e.g., wheelchair or bedside chair)?: A Little Help needed to walk in hospital room?: A Little Help needed climbing 3-5 steps with a railing? : A Little 6 Click Score: 20    End of Session Equipment Utilized During Treatment: Oxygen Activity Tolerance: Patient tolerated treatment well Patient left: in bed;with bed alarm set Nurse Communication: Mobility status PT Visit Diagnosis: Difficulty in walking, not  elsewhere classified (R26.2);Unsteadiness on feet (R26.81)    Time: 9076-9044 PT Time Calculation (min) (ACUTE ONLY): 32 min   Charges:   PT Evaluation $PT Eval Low Complexity: 1 Low PT Treatments $Gait Training: 8-22 mins PT General Charges $$ ACUTE PT VISIT: 1 Visit         [name], PT, DPT, GCS [phone]   Shafiq Larch 10/29/2023, 1:05 PM

## 2023-10-29 NOTE — Progress Notes (Signed)
 Patients HR maintaining in the 130's-140's. Dr. Cleatus notified who contacted contacted Dr. Hilma who started patient on Amiodarone  33.3 ml/hr. Patient resting in bed. Denies pain. Personal items within reach.

## 2023-10-30 ENCOUNTER — Inpatient Hospital Stay: Admit: 2023-10-30 | Discharge: 2023-10-30 | Disposition: A | Discharge status: Home / Self Care

## 2023-10-30 ENCOUNTER — Inpatient Hospital Stay

## 2023-10-30 DIAGNOSIS — I5023 Acute on chronic systolic (congestive) heart failure: Secondary | ICD-10-CM

## 2023-10-30 DIAGNOSIS — R570 Cardiogenic shock: Secondary | ICD-10-CM

## 2023-10-30 DIAGNOSIS — G928 Other toxic encephalopathy: Secondary | ICD-10-CM

## 2023-10-30 DIAGNOSIS — J9601 Acute respiratory failure with hypoxia: Secondary | ICD-10-CM

## 2023-10-30 DIAGNOSIS — I429 Cardiomyopathy, unspecified: Secondary | ICD-10-CM | POA: Diagnosis not present

## 2023-10-30 DIAGNOSIS — Z794 Long term (current) use of insulin: Secondary | ICD-10-CM

## 2023-10-30 DIAGNOSIS — I4891 Unspecified atrial fibrillation: Secondary | ICD-10-CM | POA: Diagnosis not present

## 2023-10-30 DIAGNOSIS — R7401 Elevation of levels of liver transaminase levels: Secondary | ICD-10-CM

## 2023-10-30 DIAGNOSIS — E1122 Type 2 diabetes mellitus with diabetic chronic kidney disease: Secondary | ICD-10-CM

## 2023-10-30 LAB — BASIC METABOLIC PANEL WITH GFR
Anion gap: 9 (ref 5–15)
BUN: 39 mg/dL — ABNORMAL HIGH (ref 8–23)
CO2: 21 mmol/L — ABNORMAL LOW (ref 22–32)
Calcium: 8.3 mg/dL — ABNORMAL LOW (ref 8.9–10.3)
Chloride: 100 mmol/L (ref 98–111)
Creatinine, Ser: 1.49 mg/dL — ABNORMAL HIGH (ref 0.61–1.24)
GFR, Estimated: 48 mL/min — ABNORMAL LOW (ref >60)
Glucose, Bld: 192 mg/dL — ABNORMAL HIGH (ref 70–99)
Potassium: 4.3 mmol/L (ref 3.5–5.1)
Sodium: 130 mmol/L — ABNORMAL LOW (ref 135–145)

## 2023-10-30 LAB — COOXEMETRY PANEL
Carboxyhemoglobin: 1.6 % — ABNORMAL HIGH (ref 0.5–1.5)
Carboxyhemoglobin: 1.7 % — ABNORMAL HIGH (ref 0.5–1.5)
Methemoglobin: 1 % (ref 0.0–1.5)
Methemoglobin: <0.7 % (ref 0.0–1.5)
O2 Saturation: 82.6 %
O2 Saturation: 61.4 %
Total hemoglobin: 14.9 g/dL (ref 12.0–16.0)
Total hemoglobin: 14.6 g/dL (ref 12.0–16.0)
Total oxygen content: 80.5 %
Total oxygen content: 60.1 %

## 2023-10-30 LAB — CBC
HCT: 42.9 % (ref 39.0–52.0)
Hemoglobin: 14.3 g/dL (ref 13.0–17.0)
MCH: 29.1 pg (ref 26.0–34.0)
MCHC: 33.3 g/dL (ref 30.0–36.0)
MCV: 87.4 fL (ref 80.0–100.0)
Platelets: 234 10*3/uL (ref 150–400)
RBC: 4.91 MIL/uL (ref 4.22–5.81)
RDW: 13.2 % (ref 11.5–15.5)
WBC: 11.6 10*3/uL — ABNORMAL HIGH (ref 4.0–10.5)
nRBC: 0 % (ref 0.0–0.2)

## 2023-10-30 LAB — COMPREHENSIVE METABOLIC PANEL WITH GFR
ALT: 61 U/L — ABNORMAL HIGH (ref 0–44)
AST: 49 U/L — ABNORMAL HIGH (ref 15–41)
Albumin: 3.2 g/dL — ABNORMAL LOW (ref 3.5–5.0)
Alkaline Phosphatase: 136 U/L — ABNORMAL HIGH (ref 38–126)
Anion gap: 13 (ref 5–15)
BUN: 45 mg/dL — ABNORMAL HIGH (ref 8–23)
CO2: 19 mmol/L — ABNORMAL LOW (ref 22–32)
Calcium: 8.4 mg/dL — ABNORMAL LOW (ref 8.9–10.3)
Chloride: 102 mmol/L (ref 98–111)
Creatinine, Ser: 1.63 mg/dL — ABNORMAL HIGH (ref 0.61–1.24)
GFR, Estimated: 43 mL/min — ABNORMAL LOW (ref >60)
Glucose, Bld: 190 mg/dL — ABNORMAL HIGH (ref 70–99)
Potassium: 4.8 mmol/L (ref 3.5–5.1)
Sodium: 134 mmol/L — ABNORMAL LOW (ref 135–145)
Total Bilirubin: 0.5 mg/dL (ref 0.0–1.2)
Total Protein: 6.2 g/dL — ABNORMAL LOW (ref 6.5–8.1)

## 2023-10-30 LAB — HEPATIC FUNCTION PANEL
ALT: 65 U/L — ABNORMAL HIGH (ref 0–44)
AST: 55 U/L — ABNORMAL HIGH (ref 15–41)
Albumin: 3.4 g/dL — ABNORMAL LOW (ref 3.5–5.0)
Alkaline Phosphatase: 152 U/L — ABNORMAL HIGH (ref 38–126)
Bilirubin, Direct: 0.2 mg/dL (ref 0.0–0.2)
Indirect Bilirubin: 0.8 mg/dL (ref 0.3–0.9)
Total Bilirubin: 1 mg/dL (ref 0.0–1.2)
Total Protein: 6.6 g/dL (ref 6.5–8.1)

## 2023-10-30 LAB — GLUCOSE, CAPILLARY
Glucose-Capillary: 215 mg/dL — ABNORMAL HIGH (ref 70–99)
Glucose-Capillary: 209 mg/dL — ABNORMAL HIGH (ref 70–99)
Glucose-Capillary: 222 mg/dL — ABNORMAL HIGH (ref 70–99)
Glucose-Capillary: 181 mg/dL — ABNORMAL HIGH (ref 70–99)
Glucose-Capillary: 156 mg/dL — ABNORMAL HIGH (ref 70–99)

## 2023-10-30 LAB — PROCALCITONIN: Procalcitonin: 0.19 ng/mL

## 2023-10-30 LAB — PHOSPHORUS: Phosphorus: 5.9 mg/dL — ABNORMAL HIGH (ref 2.5–4.6)

## 2023-10-30 LAB — MAGNESIUM: Magnesium: 2.9 mg/dL — ABNORMAL HIGH (ref 1.7–2.4)

## 2023-10-30 LAB — MRSA NEXT GEN BY PCR, NASAL: MRSA by PCR Next Gen: DETECTED — AB

## 2023-10-30 LAB — ECHOCARDIOGRAM COMPLETE
Height: 74 in
Weight: 3958.4 [oz_av]

## 2023-10-30 LAB — LACTIC ACID, PLASMA: Lactic Acid, Venous: 1.2 mmol/L (ref 0.5–1.9)

## 2023-10-30 MED ORDER — MUPIROCIN 2 % EX OINT
1.0000 | TOPICAL_OINTMENT | Freq: Two times a day (BID) | CUTANEOUS | Status: AC
  Administered 2023-10-30 – 2023-11-03 (×10): 1 via NASAL
  Filled 2023-10-30: qty 22

## 2023-10-30 MED ORDER — SODIUM CHLORIDE 0.9 % IV BOLUS
250.0000 mL | Freq: Once | INTRAVENOUS | Status: AC
  Administered 2023-10-30: 250 mL via INTRAVENOUS

## 2023-10-30 MED ORDER — DOCUSATE SODIUM 100 MG PO CAPS: 100.0000 mg | ORAL_CAPSULE | Freq: Two times a day (BID) | ORAL | Status: DC | PRN

## 2023-10-30 MED ORDER — ACETAMINOPHEN 500 MG PO TABS
500.0000 mg | ORAL_TABLET | Freq: Once | ORAL | Status: AC
  Administered 2023-10-30: 500 mg via ORAL

## 2023-10-30 MED ORDER — SODIUM CHLORIDE 0.9 % IV SOLN: 250.0000 mL | INTRAVENOUS | Status: AC

## 2023-10-30 MED ORDER — INSULIN GLARGINE-YFGN 100 UNIT/ML ~~LOC~~ SOLN
30.0000 [IU] | Freq: Every day | SUBCUTANEOUS | Status: DC
  Administered 2023-10-31 – 2023-11-03 (×4): 30 [IU] via SUBCUTANEOUS
  Filled 2023-10-30 (×4): qty 0.3

## 2023-10-30 MED ORDER — AMIODARONE HCL 200 MG PO TABS: 200.0000 mg | ORAL_TABLET | Freq: Two times a day (BID) | ORAL | Status: DC

## 2023-10-30 MED ORDER — DOBUTAMINE-DEXTROSE 4-5 MG/ML-% IV SOLN
2.5000 ug/kg/min | INTRAVENOUS | Status: DC
  Administered 2023-10-30 – 2023-11-03 (×4): 5 ug/kg/min via INTRAVENOUS
  Filled 2023-10-30 (×5): qty 250

## 2023-10-30 MED ORDER — CHLORHEXIDINE GLUCONATE CLOTH 2 % EX PADS
6.0000 | MEDICATED_PAD | Freq: Every day | CUTANEOUS | Status: DC
  Administered 2023-10-31 – 2023-11-04 (×4): 6 via TOPICAL

## 2023-10-30 MED ORDER — NOREPINEPHRINE 4 MG/250ML-% IV SOLN
0.0000 ug/min | INTRAVENOUS | Status: DC
  Administered 2023-10-30: 12 ug/min via INTRAVENOUS
  Filled 2023-10-30: qty 250

## 2023-10-30 MED ORDER — POLYETHYLENE GLYCOL 3350 17 G PO PACK: 17.0000 g | PACK | Freq: Every day | ORAL | Status: DC | PRN

## 2023-10-30 MED ORDER — CHLORHEXIDINE GLUCONATE CLOTH 2 % EX PADS
6.0000 | MEDICATED_PAD | Freq: Every day | CUTANEOUS | Status: DC
  Administered 2023-10-30: 6 via TOPICAL

## 2023-10-30 MED ORDER — FUROSEMIDE 10 MG/ML IJ SOLN
160.0000 mg | Freq: Once | INTRAVENOUS | Status: AC
  Administered 2023-10-30: 160 mg via INTRAVENOUS
  Filled 2023-10-30: qty 16

## 2023-10-30 NOTE — Progress Notes (Cosign Needed Addendum)
 [facility] CLINIC CARDIOLOGY PROGRESS NOTE       Patient ID: Paul Goodman MRN: [id number] DOB/AGE: Feb 16, 1946 78 y.o.  Admit date: 10/28/2023 Referring Physician Dr. Von Primary Physician Valora Agent, MD Primary Cardiologist Dr. Marshia Blanch Reason for Consultation AF RVR  HPI: Paul Goodman is a 78 y.o. male  with a past medical history of permanent atrial fibrillation (on Eliquis ), chronic HFrEF (EF < 20%), nonischemic cardiomyopathy, pulmonary hypertension, hyperlipidemia, hypertension, history CVA, diabetes mellitus, OSA (on CPAP), obesity who presented to the ED on 10/28/2023 sent by pulmonology due to extremely elevated blood glucose and hyperkalemia.  Upon admission EKG revealed atrial fibrillation RVR with rates in the 140s. Cardiology was consulted for further evaluation.   Interval History: -Patient seen and examined this AM and sitting in bedside chair. Patient appeared confused this AM and not answering questions appropriately. Patient skin mottled and cold. Patient with signs of low CO/perfusion, cardiogenic shock. -Patients BP very soft this AM and HR  improved 70-80s.  -Very poor UOP yesterday s/p IV lasix 80 mg with worsening renal function.  -Electrolytes are stable.  -Patient remains on 3L with stable SpO2.  -Consulted advance heart failure team and transferred to ICU.   Pertinent Cardiac History (Most recent) RHC/LHC (04/29/2023)   There is severe left ventricular systolic dysfunction.   LV end diastolic pressure is mildly elevated.   The left ventricular ejection fraction is less than 25% by visual estimate.   Hemodynamic findings consistent with pulmonary hypertension.   No indication for antiplatelet therapy at this time .  Right heart -Wedge mean of 20 -PA mean 24   Left ventriculogram -Depressed left ventricular function globally left ventricular enlargement EF of around 25%   Coronaries  -Normal coronaries left dominant system  -Study  consistent with a nonischemic cardiomyopathy  Review of systems complete and found to be negative unless listed above    Past Medical History:  Diagnosis Date   Anxiety    Arthritis    CHF (congestive heart failure) (HCC)    COPD (chronic obstructive pulmonary disease) (HCC)    Diabetes (HCC)    ED (erectile dysfunction)    Heart murmur    Hyperlipidemia    Hypertension    Melanoma (HCC)    Sleep apnea     Past Surgical History:  Procedure Laterality Date   EYE SURGERY Right    cancer   HAND SURGERY Left 1966   KNEE SURGERY Left 1973   RIGHT/LEFT HEART CATH AND CORONARY ANGIOGRAPHY Bilateral 04/29/2023   Procedure: RIGHT/LEFT HEART CATH AND CORONARY ANGIOGRAPHY;  Surgeon: Florencio Cara BIRCH, MD;  Location: [facility] INVASIVE CV LAB;  Service: Cardiovascular;  Laterality: Bilateral;   TONSILLECTOMY  1964    Medications Prior to Admission  Medication Sig Dispense Refill Last Dose/Taking   rOPINIRole  (REQUIP  XL) 4 MG 24 hr tablet Take 12-15 mg by mouth at bedtime.   Past Week   albuterol  (PROVENTIL  HFA;VENTOLIN  HFA) 108 (90 BASE) MCG/ACT inhaler Inhale 2 puffs into the lungs every 6 (six) hours as needed for shortness of breath.      amiodarone  (PACERONE ) 200 MG tablet Take 1 tablet (200 mg total) by mouth 2 (two) times daily. (Patient not taking: Reported on 10/29/2023) 60 tablet 2 Not Taking   amLODipine (NORVASC) 10 MG tablet Take 10 mg by mouth daily. (Patient not taking: Reported on 10/29/2023)   Not Taking   apixaban  (ELIQUIS ) 5 MG TABS tablet Take 1 tablet (5 mg total) by mouth 2 (  two) times daily. (Patient not taking: Reported on 10/29/2023) 60 tablet 3 Not Taking   carvedilol  (COREG ) 25 MG tablet Take 1 tablet (25 mg total) by mouth 2 (two) times daily with a meal. (Patient not taking: Reported on 10/29/2023) 60 tablet 3 Not Taking   Chromium Picolinate (CHROMIUM PICOLATE PO) Take by mouth. (Patient not taking: Reported on 10/29/2023)   Not Taking   dextromethorphan -guaiFENesin   (MUCINEX  DM) 30-600 MG 12hr tablet Take 1 tablet by mouth every evening.      fenofibrate  160 MG tablet Take 160 mg by mouth daily. (Patient not taking: Reported on 10/29/2023)   Not Taking   furosemide (LASIX) 20 MG tablet Take 20 mg by mouth daily as needed. (Patient not taking: Reported on 10/29/2023)   Not Taking   hydrochlorothiazide  (HYDRODIURIL ) 25 MG tablet Take 25 mg by mouth daily. (Patient not taking: Reported on 10/29/2023)   Not Taking   Insulin  Glargine (BASAGLAR  KWIKPEN) 100 UNIT/ML Inject 60 Units into the skin daily. (Patient not taking: Reported on 10/29/2023)   Not Taking   liraglutide (VICTOZA) 18 MG/3ML SOPN Inject 1.2 mg into the skin once a week. (Patient not taking: Reported on 10/29/2023)   Not Taking   LORazepam  (ATIVAN ) 1 MG tablet Take 0.5-1 mg by mouth daily as needed. TAKE ONE-HALF (1/2) TO ONE (1) TABLET BY MOUTH DAILY AS NEEDED FOR ANXIETY, PANIC, OR THROAT SWELLING (Patient not taking: Reported on 04/29/2023)      losartan  (COZAAR ) 100 MG tablet Take 100 mg by mouth daily. (Patient not taking: Reported on 10/29/2023)   Not Taking   PARoxetine  (PAXIL ) 20 MG tablet Take 20 mg by mouth daily. (Patient not taking: Reported on 10/29/2023)   Not Taking   pramipexole (MIRAPEX) 0.125 MG tablet TAKE 1 TABLET BY MOUTH 2 HOURS BEFORE BEDTIME, MAY TITRATE BY 1 TABLET PER WEEK AS NEEDED MAXIMUM 4 TABLETS. (Patient not taking: Reported on 04/29/2023)   Not Taking   rosuvastatin  (CRESTOR ) 5 MG tablet Take 5 mg by mouth daily. (Patient not taking: Reported on 10/29/2023)   Not Taking   spironolactone  (ALDACTONE ) 25 MG tablet Take 0.5 tablets (12.5 mg total) by mouth daily. (Patient not taking: Reported on 10/29/2023) 30 tablet 3 Not Taking   VANADIUM PO Take by mouth.      Zinc  Sulfate (ZINC  15 PO) Take by mouth. (Patient not taking: Reported on 10/29/2023)   Not Taking   Social History   Socioeconomic History   Marital status: Married    Spouse name: Not on file   Number of children: Not  on file   Years of education: Not on file   Highest education level: Not on file  Occupational History   Not on file  Tobacco Use   Smoking status: Former    Types: Cigarettes   Smokeless tobacco: Former    Types: Chew   Tobacco comments:    quit 1992  Vaping Use   Vaping status: Never Used  Substance and Sexual Activity   Alcohol use: Not Currently    Alcohol/week: 14.0 standard drinks of alcohol    Types: 14 Standard drinks or equivalent per week   Drug use: Not Currently    Types: Marijuana   Sexual activity: Not Currently  Other Topics Concern   Not on file  Social History Narrative   Not on file   Social Drivers of Health   Financial Resource Strain: Medium Risk (09/17/2023)   Received from [facility]   Overall  Financial Resource Strain (CARDIA)    Difficulty of Paying Living Expenses: Somewhat hard  Food Insecurity: Food Insecurity Present (10/28/2023)   Hunger Vital Sign    Worried About Running Out of Food in the Last Year: Sometimes true    Ran Out of Food in the Last Year: Sometimes true  Transportation Needs: No Transportation Needs (10/28/2023)   PRAPARE - Administrator, Civil Service (Medical): No    Lack of Transportation (Non-Medical): No  Physical Activity: Not on file  Stress: Not on file  Social Connections: Socially Integrated (10/28/2023)   Social Connection and Isolation Panel    Frequency of Communication with Friends and Family: Three times a week    Frequency of Social Gatherings with Friends and Family: Three times a week    Attends Religious Services: 1 to 4 times per year    Active Member of Clubs or Organizations: Yes    Attends Banker Meetings: 1 to 4 times per year    Marital Status: Married  Catering manager Violence: Not At Risk (10/28/2023)   Humiliation, Afraid, Rape, and Kick questionnaire    Fear of Current or Ex-Partner: No    Emotionally Abused: No    Physically Abused: No    Sexually  Abused: No    Family History  Problem Relation Age of Onset   Kidney Stones Father    Kidney disease Neg Hx    Prostate cancer Neg Hx    Kidney cancer Neg Hx    Bladder Cancer Neg Hx      Vitals:   10/30/23 0920 10/30/23 0930 10/30/23 0940 10/30/23 0950  BP:  (!) 69/56 (!) 71/44   Pulse: (!) 103 (!) 48 (!) 53 (!) 44  Resp: (!) 0 19 (!) 23 11  Temp:      TempSrc:      SpO2: 98% 97% 95% 97%  Weight:      Height:        PHYSICAL EXAM General: Chronically ill appearing elderly male, well nourished, in no acute distress. HEENT: Normocephalic and atraumatic. Neck: No JVD.   Lungs: Shallow respiratory effort on 3L.  Diminished breath sounds bilaterally at bases Heart: Irregularly irregular, controlled rate. Normal S1 and S2 without gallops or murmurs.  Abdomen: Non-distended appearing.  Msk: Normal strength and tone for age. Extremities: Mottled, cold skin. LEE Ace-wrapped, + pedal edema   Labs: Basic Metabolic Panel: Recent Labs    10/29/23 0902 10/30/23 0244 10/30/23 0957  NA  --  130* 134*  K  --  4.3 4.8  CL  --  100 102  CO2  --  21* 19*  GLUCOSE  --  192* 190*  BUN  --  39* 45*  CREATININE  --  1.49* 1.63*  CALCIUM   --  8.3* 8.4*  MG 2.0 2.9*  --   PHOS 4.0 5.9*  --    Liver Function Tests: Recent Labs    10/30/23 0957  AST 49*  ALT 61*  ALKPHOS 136*  BILITOT 0.5  PROT 6.2*  ALBUMIN 3.2*   No results for input(s): LIPASE, AMYLASE in the last 72 hours. CBC: Recent Labs    10/29/23 0516 10/30/23 0244  WBC 7.9 11.6*  HGB 14.1 14.3  HCT 41.3 42.9  MCV 85.9 87.4  PLT 202 234   Cardiac Enzymes: No results for input(s): CKTOTAL, CKMB, CKMBINDEX, TROPONINIHS in the last 72 hours. BNP: Recent Labs    10/28/23 1649  BNP 987.7*  D-Dimer: No results for input(s): DDIMER in the last 72 hours. Hemoglobin A1C: Recent Labs    10/29/23 0516  HGBA1C 12.8*   Fasting Lipid Panel: No results for input(s): CHOL, HDL, LDLCALC,  TRIG, CHOLHDL, LDLDIRECT in the last 72 hours. Thyroid Function Tests: Recent Labs    10/28/23 1649  TSH 1.812   Anemia Panel: Recent Labs    10/29/23 0902  VITAMINB12 546     Radiology: [facility] Chest Port 1 View Result Date: 10/29/2023 CLINICAL DATA:  Congestive heart failure. EXAM: PORTABLE CHEST 1 VIEW COMPARISON:  February 09, 2023. FINDINGS: Mild cardiomegaly is noted with mild central pulmonary vascular congestion. Bibasilar pulmonary edema is noted. Bony thorax is unremarkable. IMPRESSION: Mild cardiomegaly with mild central pulmonary vascular congestion and bilateral pulmonary edema. Electronically Signed   By: Lynwood Landy Raddle M.D.   On: 10/29/2023 16:30   CT HEAD WO CONTRAST ( ) Result Date: 10/28/2023 CLINICAL DATA:  Recent syncopal episode EXAM: CT HEAD WITHOUT CONTRAST TECHNIQUE: Contiguous axial images were obtained from the base of the skull through the vertex without intravenous contrast. RADIATION DOSE REDUCTION: This exam was performed according to the departmental dose-optimization program which includes automated exposure control, adjustment of the mA and/or kV according to patient size and/or use of iterative reconstruction technique. COMPARISON:  02/09/2023 FINDINGS: Brain: No evidence of acute infarction, hemorrhage, hydrocephalus, extra-axial collection or mass lesion/mass effect. Remote right frontoparietal infarct is again seen and stable. Vascular calcifications are noted in the centrum semi ovale stable from the prior exam. Basal ganglia calcifications are seen as well. Vascular: No hyperdense vessel or unexpected calcification. Skull: Normal. Negative for fracture or focal lesion. Sinuses/Orbits: No acute finding. Other: None. IMPRESSION: Chronic right frontoparietal infarct.  No acute abnormality noted. Electronically Signed   By: Oneil Devonshire M.D.   On: 10/28/2023 19:47    ECHO ordered  TELEMETRY reviewed by me 10/30/2023: Atrial fibrillation, rate 90s to  100s.  EKG reviewed by me: Atrial fibrillation RVR rate 131 bpm  Data reviewed by me 10/30/2023: last 24h vitals tele labs imaging I/O hospitalist progress notes.  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 male  with a past medical history of permanent atrial fibrillation (on Eliquis ), chronic HFrEF (EF < 20%), nonischemic cardiomyopathy, pulmonary hypertension, hyperlipidemia, hypertension history CVA, diabetes mellitus, OSA (on CPAP), obesity who presented to the ED on 10/28/2023 sent by pulmonology due to extremely elevated blood glucose and hyperkalemia.  Upon admission EKG revealed atrial fibrillation RVR with rates in the 140s. Cardiology was consulted for further evaluation.   # Atrial fibrillation RVR # Permanent atrial fibrillation Patient denies chest pain, palpitations. EKG in ED with atrial fibrillation rate 131 bpm. Patient started on IV amio infusion. Per tele in AF with improving HR in 70-80s. -Monitor and replenish electrolytes for a goal K >4, Mag >2  -PO home amio 200 mg daily held per advanced heart failure  team. -Consider adding digoxin for better rate control and improved contractility for reduced EF. -Continue Eliquis  5 mg twice daily for stroke risk reduction. -D/C Coreg  as stated below.  # Cardiogenic shock # Acute on chronic HFrEF # Nonischemic cardiomyopathy # Medication noncompliance Patient with stated  noncompliance to medication.  Patient states he does not take his cardiac medications as prescribed at home because he believes the medications do not help him.  Presents with worsening SOB, orthopnea and LEE. CXR with mild cardiomegaly and pulmonary vascular congestion and bilateral pulmonary edema. BNP elevated at 990. Poor UOP yesterday (06/24) s/p IV lasix 80 mg. Patient confused, cold, hypotensive this AM (06/25), transferred to ICU and started on levo and dobutamine infusion due to low perfusion/low CO, cardiogenic shock. Lactic acid 1.2 this AM. -Echo ordered.  Further recommendations pending results. -Advanced heart failure consulted, appreciate recommendations.  -Patient started on dobutamine and levo infusion per heart failure team.  -Hold diuresis for now per advanced heart failure team. Closely monitor renal function and UOP. (Patient prescribed Lasix 20 mg daily at home however states he does not take this.) -D/C Coreg  25 mg twice daily. Will resume when hemodynamically stable and closer to euvolemia.  -Home spiro 25 mg held due to hemodynamic instability.  -Hold off on starting dapagliflozin  due to uncontrolled A1C of 12.8% ([place] $ 38.08) -Plan to resume and optimize GDMT when patient off pressors and hemodynamically stable.  -There has been brief outpatient discussion about possible defibrillator therapy with Dr. Tobie if patient remains on GDMT and does not show improvement to cardiac function.  # Hypertension # Hyperlipidemia Patient hypotensive, started on dobutamine and levo infusion.  -Continue rosuvastatin  5 mg daily, fenofibrate  160 mg daily.   This patient's plan of care was discussed and created with Dr. Florencio and he is in agreement.  Signed: Dorene Comfort, PA-C  10/30/2023, 10:37 AM [facility]

## 2023-10-30 NOTE — Progress Notes (Signed)
 Patient transferred to ICU via bed with O2 and x2 RN for blood pressure management per Cardiac PA. Bedside report given to [facility] in ICU.

## 2023-10-30 NOTE — Procedures (Signed)
 Central Venous Catheter Insertion Procedure Note  Paul Goodman  [id number]  12/24/1945  Date:10/30/23  Time:10:59 AM   Provider Performing:Fanta Wimberley   Procedure: Insertion of Non-tunneled Central Venous Catheter(36556) with US  guidance (23062)   Indication(s) Medication administration  Consent Unable to obtain consent due to emergent nature of procedure.  Anesthesia Topical only with 1% lidocaine    Timeout Verified patient identification, verified procedure, site/side was marked, verified correct patient position, special equipment/implants available, medications/allergies/relevant history reviewed, required imaging and test results available.  Sterile Technique Maximal sterile technique including full sterile barrier drape, hand hygiene, sterile gown, sterile gloves, mask, hair covering, sterile ultrasound probe cover (if used).  Procedure Description Area of catheter insertion was cleaned with chlorhexidine  and draped in sterile fashion.  With real-time ultrasound guidance a central venous catheter was placed into the left internal jugular vein. Nonpulsatile blood flow and easy flushing noted in all ports.  The catheter was sutured in place and sterile dressing applied.  Complications/Tolerance None; patient tolerated the procedure well. Chest X-ray is ordered to verify placement for internal jugular or subclavian cannulation.   Chest x-ray is not ordered for femoral cannulation.  EBL Minimal  Specimen(s) None  Paul November, MD [facility] 10/30/2023 10:59 AM

## 2023-10-30 NOTE — Progress Notes (Signed)
 PT Cancellation Note  Patient Details Name: Paul Goodman MRN: [id number] DOB: 07/19/1945   Cancelled Treatment:    Reason Eval/Treat Not Completed: Other (comment). Pt currently transferred to higher level of care due to medical concerns. Will complete current order at this time. Please re-order when pt is medically stable and able to participate with therapy.   [name] 10/30/2023, 11:17 AM [name], PT, DPT, GCS [phone]

## 2023-10-30 NOTE — Consult Note (Addendum)
 NAME:  Paul Goodman, MRN:  [id number], DOB:  01-13-1946, LOS: 1 ADMISSION DATE:  10/28/2023, CONSULTATION DATE:  10/30/2023 REFERRING MD:  Elvan Sor, MD, CHIEF COMPLAINT: Cardiogenic Shock   History of Present Illness:   Patient is a 78 year old male presenting to the ICU with encephalopathy and cardiogenic shock.  Paul Goodman was sent to the ED on 10/28/2023 from [facility] with increased shortness of breath and Afib with RVR. He was admitted to the hospitalist service for management of decompensated heart failure. On presentation, he had reported mild shortness of breath and was also found to have significant hyperglycemia as well as Afib with RVR. BNP was noted to be significantly elevated at 987, glucose was 543, and his creatinine was elevated at 1.3. He was admitted to the hospitalist service and was also seen by cardiology for management of his decompensated heart failure and afib with RVR. Rate was controlled with amiodarone . He was recommended GDMT with carvedilol , entresto , and spironolactone . He was also administered IV furosemide and digoxin.  This morning, he was found to be encephalopathic and hypotensive, prompting transfer to the ICU for further care. He is being followed closely by cardiology, and the advanced heart failure team was consulted today to aid in his management. Dobutamine and nor-epinephrine both ordered for ionotropic support.  He has a history of HFrEF, with his last EF being around 20%. Most recent RHC from 04/2023 showed no CAD but had elevated filling pressures and a wedge pressure of 20. RHC numbers showed RA 9, RV 32/11, PA 32/22 (24), PAOP 20, LVEDP 13, CO 4.12, CI 1.74  Pertinent  Medical History   -HFrEF (EF < 20%) -HTN -HLD -CKD -Afib on Apixaban  -OSA on CPAP  Significant Hospital Events: Including procedures, antibiotic start and stop dates in addition to other pertinent events   6/23: admit from clinic, afib with RVR 6/24: IV  diuresis, attempt at GDMT 6/25: encephalopathic and hypotensive, transferred to the ICU  Objective    Blood pressure (!) 71/44, pulse (!) 44, temperature 98 F (36.7 C), temperature source Axillary, resp. rate 11, height 6' 2 (1.88 m), weight 112.2 kg, SpO2 97%.        Intake/Output Summary (Last 24 hours) at 10/30/2023 1020 Last data filed at 10/30/2023 0801 Gross per 24 hour  Intake 475.25 ml  Output 1050 ml  Net -574.75 ml   Filed Weights   10/28/23 2236 10/29/23 0500 10/30/23 0500  Weight: 111.3 kg 111.3 kg 112.2 kg    Examination: Physical Exam Constitutional:      General: He is in acute distress.     Appearance: He is ill-appearing.   Cardiovascular:     Rate and Rhythm: Normal rate and regular rhythm.     Pulses: Normal pulses.     Heart sounds: Normal heart sounds.  Pulmonary:     Effort: Pulmonary effort is normal.     Breath sounds: No wheezing.   Neurological:     Mental Status: He is disoriented.      Assessment and Plan   Neurology #Toxic Metabolic Encephalopathy  Encephalopathy is in the setting of cardiogenic shock. He does arouse to verbal stimuli and answers some questions appropriately. He did have a stroke in 2022 (R. MCA territory) for which he received tPA and felt to be embolic due to Afib. No sign of CVA at this point.  -d/c psychotropic medications (lorazepam , oxycodone )  Cardiovascular #Cardiogenic Shock #Acute on Chronic HFrEF #Non-ischemic cardiomyopathy #Afib with  RVR  Transferred to the ICU in circulatory shock, with significant hypotension and associated encephalopathy. His RHC in 2024 showed PAOP of 20 mmHg, with EF of 25%, and TTE from 08/2023 showed similar EF of 20 to 25%. On bedside POCUS today, his EF is severely depressed with global hypokinesis, pending confirmation with official TTE. This is overall suggestive of cardiogenic shock. His decompensation was triggered by dietary non-compliance as well as afib with RVR.  Currently rate controlled with amiodarone , and anti-HTN and GDMT agents are held due to significant hypotension. Dobutamine and nor-epinephrine both initiated for ionotropic support, and cardiology and heart failure teams both consulted and following.  -d/c carvedilol , d/c spironolactone  -lactic acid normal at 1.2 -start nor-epinephrine, goal MAP > 65 mmHg -start dobutamine, will titrate based on central venous O2 saturation -consider PA catheter for tailored heart failure management -TTE  Pulmonary #Acute Hypoxic Respiratory Failure #OSA  Mild hypoxia with increased interstitial markings on his chest xray, consistent with pulmonary edema secondary to decompensated heart failure. This is improved with oxygen supplementation. Will check pro-calcitonin to assess for possible superimposed HAP. Review of the medical record notes history of OSA, though unclear if this is something that he wears regularly.  -continue O2 via nasal cannula, goal SpO2 > 92%  Gastrointestinal #Elevated LFT's  NPO given encephalopathy. His LFT elevation is secondary to cardiogenic shock, but we will check an abdominal US  to rule out cholecystitis.  -monitor LFT's -check US  RUQ  Renal #AKI  AKI on CKD in the setting of cardiogenic shock. Currently on vasopressor support while we hold nephrotoxins and await for renal recovery.  -avoid nephrotoxic medications  Endocrine #T2DM  On basal bolus regimen with insulin  glargine 45 units daily and sliding scale insulin  aspart. Will decrease glargine dose to 30 units given critical illness and NPO status. He did present with significant hyperglycemia > 500 which is now better controlled with insulin .  Hem/Onc  On Apixaban  for Afib, will continue.  ID  No sign of active infection, and his presentation is consistent with decompensated heart failure/cardiogenic shock. Will check procalcitonin today.   -monitor fever curve off antibiotics  Best Practice (right  click and Reselect all SmartList Selections daily)   Diet/type: NPO DVT prophylaxis DOAC Pressure ulcer(s): N/A GI prophylaxis: N/A Lines: Central line Foley:  Yes, and it is still needed Code Status:  full code Last date of multidisciplinary goals of care discussion [10/30/2023]  Labs   CBC: Recent Labs  Lab 10/28/23 1649 10/29/23 0516 10/30/23 0244  WBC 8.6 7.9 11.6*  HGB 14.6 14.1 14.3  HCT 42.6 41.3 42.9  MCV 85.9 85.9 87.4  PLT 221 202 234    Basic Metabolic Panel: Recent Labs  Lab 10/28/23 1649 10/29/23 0516 10/29/23 0902 10/30/23 0244  NA 131* 136  --  130*  K 4.8 3.9  --  4.3  CL 100 104  --  100  CO2 20* 22  --  21*  GLUCOSE 543* 203*  --  192*  BUN 34* 31*  --  39*  CREATININE 1.30* 0.99  --  1.49*  CALCIUM  8.7* 8.3*  --  8.3*  MG  --   --  2.0 2.9*  PHOS  --   --  4.0 5.9*   GFR: Estimated Creatinine Clearance: 55.3 mL/min (A) (by C-G formula based on SCr of 1.49 mg/dL (H)). Recent Labs  Lab 10/28/23 1649 10/29/23 0516 10/30/23 0244  WBC 8.6 7.9 11.6*    Liver Function Tests: No results  for input(s): AST, ALT, ALKPHOS, BILITOT, PROT, ALBUMIN in the last 168 hours. No results for input(s): LIPASE, AMYLASE in the last 168 hours. No results for input(s): AMMONIA in the last 168 hours.  ABG    Component Value Date/Time   PHART 7.415 04/29/2023 1412   PCO2ART 36.0 04/29/2023 1412   PO2ART 93 04/29/2023 1412   HCO3 23.1 04/29/2023 1412   TCO2 24 04/29/2023 1412   ACIDBASEDEF 1.0 04/29/2023 1412   O2SAT 97 04/29/2023 1412     Coagulation Profile: No results for input(s): INR, PROTIME in the last 168 hours.  Cardiac Enzymes: No results for input(s): CKTOTAL, CKMB, CKMBINDEX, TROPONINI in the last 168 hours.  HbA1C: Hgb A1c MFr Bld  Date/Time Value Ref Range Status  10/29/2023 05:16 AM 12.8 (H) 4.8 - 5.6 % Final    Comment:    (NOTE) Diagnosis of Diabetes The following HbA1c ranges recommended by the  American Diabetes Association (ADA) may be used as an aid in the diagnosis of diabetes mellitus.  Hemoglobin             Suggested A1C NGSP%              Diagnosis  <5.7                   Non Diabetic  5.7-6.4                Pre-Diabetic  >6.4                   Diabetic  <7.0                   Glycemic control for                       adults with diabetes.    08/07/2022 06:16 AM 7.7 (H) 4.8 - 5.6 % Final    Comment:    (NOTE)         Prediabetes: 5.7 - 6.4         Diabetes: >6.4         Glycemic control for adults with diabetes: <7.0     CBG: Recent Labs  Lab 10/29/23 0736 10/29/23 1148 10/29/23 1615 10/29/23 1957 10/30/23 0753  GLUCAP 230* 367* 337* 246* 215*    Review of Systems:   Unable to obtain  Past Medical History:  He,  has a past medical history of Anxiety, Arthritis, CHF (congestive heart failure) (HCC), COPD (chronic obstructive pulmonary disease) (HCC), Diabetes (HCC), ED (erectile dysfunction), Heart murmur, Hyperlipidemia, Hypertension, Melanoma (HCC), and Sleep apnea.   Surgical History:   Past Surgical History:  Procedure Laterality Date   EYE SURGERY Right    cancer   HAND SURGERY Left 1966   KNEE SURGERY Left 1973   RIGHT/LEFT HEART CATH AND CORONARY ANGIOGRAPHY Bilateral 04/29/2023   Procedure: RIGHT/LEFT HEART CATH AND CORONARY ANGIOGRAPHY;  Surgeon: Florencio Cara BIRCH, MD;  Location: [facility] INVASIVE CV LAB;  Service: Cardiovascular;  Laterality: Bilateral;   TONSILLECTOMY  1964     Social History:   reports that he has quit smoking. His smoking use included cigarettes. He has quit using smokeless tobacco.  His smokeless tobacco use included chew. He reports that he does not currently use alcohol after a past usage of about 14.0 standard drinks of alcohol per week. He reports that he does not currently use drugs after having used the following drugs: Marijuana.   Family History:  His family history includes Kidney Stones in his father.  There is no history of Kidney disease, Prostate cancer, Kidney cancer, or Bladder Cancer.   Allergies No Known Allergies   Home Medications  Prior to Admission medications   Medication Sig Start Date End Date Taking? Authorizing Provider  rOPINIRole  (REQUIP  XL) 4 MG 24 hr tablet Take 12-15 mg by mouth at bedtime. 04/11/23 04/10/24 Yes [provider]  albuterol  (PROVENTIL  HFA;VENTOLIN  HFA) 108 (90 BASE) MCG/ACT inhaler Inhale 2 puffs into the lungs every 6 (six) hours as needed for shortness of breath. 06/07/14   [provider]  amiodarone  (PACERONE ) 200 MG tablet Take 1 tablet (200 mg total) by mouth 2 (two) times daily. Patient not taking: Reported on 10/29/2023 06/06/20   Patel, Sona, MD  amLODipine (NORVASC) 10 MG tablet Take 10 mg by mouth daily. Patient not taking: Reported on 10/29/2023 03/26/23   [provider]  apixaban  (ELIQUIS ) 5 MG TABS tablet Take 1 tablet (5 mg total) by mouth 2 (two) times daily. Patient not taking: Reported on 10/29/2023 06/06/20   Patel, Sona, MD  carvedilol  (COREG ) 25 MG tablet Take 1 tablet (25 mg total) by mouth 2 (two) times daily with a meal. Patient not taking: Reported on 10/29/2023 06/06/20   Patel, Sona, MD  Chromium Picolinate (CHROMIUM PICOLATE PO) Take by mouth. Patient not taking: Reported on 10/29/2023    [provider]  dextromethorphan -guaiFENesin  (MUCINEX  DM) 30-600 MG 12hr tablet Take 1 tablet by mouth every evening.    [provider]  fenofibrate  160 MG tablet Take 160 mg by mouth daily. Patient not taking: Reported on 10/29/2023 06/16/14   [provider]  furosemide (LASIX) 20 MG tablet Take 20 mg by mouth daily as needed. Patient not taking: Reported on 10/29/2023 12/15/18   [provider]  hydrochlorothiazide  (HYDRODIURIL ) 25 MG tablet Take 25 mg by mouth daily. Patient not taking: Reported on 10/29/2023 06/21/22   [provider]  Insulin  Glargine (BASAGLAR  KWIKPEN) 100  UNIT/ML Inject 60 Units into the skin daily. Patient not taking: Reported on 10/29/2023 06/16/22   [provider]  liraglutide (VICTOZA) 18 MG/3ML SOPN Inject 1.2 mg into the skin once a week. Patient not taking: Reported on 10/29/2023    [provider]  LORazepam  (ATIVAN ) 1 MG tablet Take 0.5-1 mg by mouth daily as needed. TAKE ONE-HALF (1/2) TO ONE (1) TABLET BY MOUTH DAILY AS NEEDED FOR ANXIETY, PANIC, OR THROAT SWELLING Patient not taking: Reported on 04/29/2023 05/30/20   [provider]  losartan  (COZAAR ) 100 MG tablet Take 100 mg by mouth daily. Patient not taking: Reported on 10/29/2023 04/18/22   [provider]  PARoxetine  (PAXIL ) 20 MG tablet Take 20 mg by mouth daily. Patient not taking: Reported on 10/29/2023 12/07/13   [provider]  pramipexole (MIRAPEX) 0.125 MG tablet TAKE 1 TABLET BY MOUTH 2 HOURS BEFORE BEDTIME, MAY TITRATE BY 1 TABLET PER WEEK AS NEEDED MAXIMUM 4 TABLETS. Patient not taking: Reported on 04/29/2023 07/31/22   [provider]  rosuvastatin  (CRESTOR ) 5 MG tablet Take 5 mg by mouth daily. Patient not taking: Reported on 10/29/2023 07/27/22   [provider]  spironolactone  (ALDACTONE ) 25 MG tablet Take 0.5 tablets (12.5 mg total) by mouth daily. Patient not taking: Reported on 10/29/2023 06/07/20   Patel, Sona, MD  VANADIUM PO Take by mouth.    [provider]  Zinc  Sulfate (ZINC  15 PO) Take by mouth. Patient not taking: Reported on 10/29/2023  [provider]     Critical care time: 58 minutes    Belva November, MD [facility] 10/30/2023 1:23 PM

## 2023-10-30 NOTE — Progress Notes (Signed)
 [facility] Progress Note  Patient: Paul Goodman    FMW:[id number]  DOA: 10/28/2023     Date of Service: the patient was seen and examined on 10/30/2023  Chief Complaint  Patient presents with   Hyperglycemia   Brief hospital course: Paul Goodman is a 79 y.o. male with medical history significant of HTN HLD, DM, COPD, sCHF with EF < 20%, stroke, CKD 32, BPH, A-fib on Eliquis , OSA on CPAP, obesity, depression with anxiety, who presents with abnormal lab with hyperglycemia and hyperkalemia.   Pt had a follow-up visit with his pulmonologist in [facility] today, and was found to have hyperglycemia and hyperkalemia.  Blood sugar is above 500 and and potassium is 5.6.  Patient was sent to ED for further evaluation and treatment.  He state that he stopped taking his diabetic medications and some other medications for more than 3 months.  He said he was asked to be evaluated by his physician before giving him more prescription, but he did not have appointment.  He states that sometimes he has heart racing, no chest pain.  He has mild dry cough and mild SOB, polyuria with increased urinary frequency, but no dysuria or burning with urination.Paul Goodman  He states that he fell 2 weeks ago, no LOC. denies any other complaints   Pt is found to have A-fib with RVR, heart rate 110-140s in ED   Data reviewed independently and ED Course:  Vitals; afebrile, BP 136/112, HR 110--140's, RR 18 O2 sat 99% on RA WBC 8.6, BMP: BG 543, anion gap 11, BNP 987.1 worsening renal function with creatinine 1.30, BUN 34 and GFR 57 (recent baseline creatinine 0.92 on 02/09/2023) negative UA, CT of head negative for acute issues, and showed chronic infarction.  Cardiology and psych were consulted TRH was consulted for admission and further management as below   6/25 patient was transferred to ICU under critical care service due to low output cardiac failure.  Patient was started on Levophed and dobutamine.  Advanced heart  failure team is also following. [facility] is signing off and will be available to take over when patient will be stable to downgrade from ICU.  Assessment and Plan:  # Acute on chronic systolic CHF exacerbation  TTE on 05/26/2020: LVEF<20%.  elevated BNP 987, significant lower extremity edema, and SOB S/p Lasix 80 mg one-time dose given Resumed Aldactone  25 mg p.o. daily Cardiology following 6/25 patient was noticed in low cardiac output failure and patient was immediately transferred to ICU, heart failure team was consulted and patient was started on Levophed and dobutamine IV infusion Patient was transferred to critical care service.   # Atrial fibrillation with RVR: HR is 110-140s.  Possibly due to medication noncompliance. - start home amiodarone  200 mg daily and Coreg  25 mg twice daily - Continue Eliquis  (I discussed with patient, he agreed to restart medications which are necessary for him) - TSH level wnl 6/24 magnesium  4 g IV one-time dose given by cardiology - s/p Amiodarone  drip, d/c'd on 6/25 and patient was started on Levophed and dobutamine Cardiology consulted   # Acute renal failure superimposed on stage 2 chronic kidney disease:  - s/p Lasix and spironolactone  - Hold HCTZ and Cozaar  - Patient received 1 L normal saline in ED 6/25 patient was noticed to have high-output cardiac failure, low blood pressure.  Started Levophed and dobutamine.  Patient was transferred to ICU.   # Hypertension:  Patient was on Coreg  25 mg p.o. twice daily.  Which was discontinued on 6/25  6/25 patient was noticed to have high-output cardiac failure, low blood pressure.  Started Levophed and dobutamine.  Patient was transferred to ICU.    # Type II diabetes mellitus with renal manifestations:  Recent A1c 7.7, poorly controlled.  Patient is supposed to take Victoza and glargine insulin  60 units daily, but not taking medications currently.  Blood sugar 543 with normal anion gap 11 6/24 Hemoglobin  A1c 12.8 uncontrolled - Start glargine insulin  45 units daily - SSI   # Depression with anxiety -As needed Ativan  6/24 discontinued paroxetine , started Effexor seen by psych   # COPD (chronic obstructive pulmonary disease): No wheezes -Bronchodilators as needed Mucinex     # Hyperlipidemia: on Crestor  5 mg and started fenofibrate    # Stroke: Crestor  - Patient is on Eliquis  for A-fib   # Vitamin D insufficiency: started vitamin D 50,000 units p.o. weekly, follow with PCP to repeat vitamin D level after 3 to 6 months.   # Fall at home, initial encounter: CT -head negative for injury - Continue fall precaution - PT/OT eval when stable  # OSA: on CPAP  Body mass index is 31.76 kg/m.  Interventions:  Diet: Heart healthy/carb modified, fluid restriction 1.5 L/day DVT Prophylaxis: Eliquis   Advance goals of care discussion: Full code  Family Communication: family was not present at bedside, at the time of interview.  The pt provided permission to discuss medical plan with the family. Opportunity was given to ask question and all questions were answered satisfactorily.  6/25 I called patient's wife but she did not answer the phone call. Discussed with ICU team, they will notify family regarding patient's current condition  Disposition:  Pt is from home, admitted with A-fib with RVR, CHF and hyperglycemia, still has CHF and A-fib. 6/25 patient was found to have low output cardiac failure, started on Levophed and dobutamine.  Patient was transferred to ICU for critical care management and heart failure team is also following. which precludes a safe discharge. Discharge to home vs SNF TBD, when stable, will definitely need few days to improve  Subjective: No significant events overnight, in the morning time patient was very lethargic and less interactive as compared to yesterday. Patient was seen by cardiology PA and notified for transfer to ICU due to low output cardiac failure.   Patient was seen after moving to ICU, patient was very lethargic.  Denied any specific complaints.  Physical Exam: General: Very lethargic, mild- mod respiratory distress affect depressed Eyes: PERRLA ENT: Oral Mucosa Clear, moist  Neck: no JVD,  Cardiovascular: Irregular rhythm, no Murmur,  Respiratory: good respiratory effort, Bilateral Air entry equal and Decreased, no Crackles, no wheezes Abdomen: Bowel Sound present, Soft and no tenderness,  Skin: no rashes Extremities: 4+ BL lower extremity edema, no calf tenderness Neurologic: without any new focal findings Gait not checked due to patient safety concerns  Vitals:   10/30/23 1400 10/30/23 1415 10/30/23 1430 10/30/23 1445  BP: (!) 112/92 (!) 119/99 104/70 (!) 80/66  Pulse: 75     Resp: (!) 25     Temp:      TempSrc:      SpO2: 97% 98%  97%  Weight:      Height:        Intake/Output Summary (Last 24 hours) at 10/30/2023 1547 Last data filed at 10/30/2023 1400 Gross per 24 hour  Intake 577.4 ml  Output 750 ml  Net -172.6 ml   [employer]  10/28/23 2236 10/29/23 0500 10/30/23 0500  Weight: 111.3 kg 111.3 kg 112.2 kg    Data Reviewed: I have personally reviewed and interpreted daily labs, tele strips, imagings as discussed above. I reviewed all nursing notes, pharmacy notes, vitals, pertinent old records I have discussed plan of care as described above with RN and patient/family.  CBC: Recent Labs  Lab 10/28/23 1649 10/29/23 0516 10/30/23 0244  WBC 8.6 7.9 11.6*  HGB 14.6 14.1 14.3  HCT 42.6 41.3 42.9  MCV 85.9 85.9 87.4  PLT 221 202 234   Basic Metabolic Panel: Recent Labs  Lab 10/28/23 1649 10/29/23 0516 10/29/23 0902 10/30/23 0244 10/30/23 0957  NA 131* 136  --  130* 134*  K 4.8 3.9  --  4.3 4.8  CL 100 104  --  100 102  CO2 20* 22  --  21* 19*  GLUCOSE 543* 203*  --  192* 190*  BUN 34* 31*  --  39* 45*  CREATININE 1.30* 0.99  --  1.49* 1.63*  CALCIUM  8.7* 8.3*  --  8.3* 8.4*  MG  --   --   2.0 2.9*  --   PHOS  --   --  4.0 5.9*  --     Studies: DG Chest Port 1 View Result Date: 10/30/2023 CLINICAL DATA:  Central line placement. EXAM: PORTABLE CHEST 1 VIEW COMPARISON:  October 29, 2023 FINDINGS: Since the prior study there is been interval placement of a left-sided internal jugular venous catheter. Its distal tip is seen at the level of the right hilum within the distal portion of the superior vena cava. The cardiac silhouette is mildly enlarged and unchanged in size. Low lung volumes are noted. Stable prominence of the pulmonary vasculature is seen with associated increased interstitial lung markings. Mild to moderate severity areas of atelectasis and/or infiltrate are present within the bilateral lung bases. No pleural effusion or pneumothorax is identified. Multilevel degenerative changes are seen throughout the thoracic spine. IMPRESSION: 1. Interval left-sided internal jugular venous catheter placement and positioning, as described above, without evidence of pneumothorax. 2. Stable cardiomegaly with mild to moderate severity pulmonary vascular congestion and pulmonary edema. 3. Mild to moderate severity bibasilar atelectasis and/or infiltrate. Electronically Signed   By: Suzen Dials M.D.   On: 10/30/2023 12:30    Scheduled Meds:  acetaminophen   500 mg Oral TID   apixaban   5 mg Oral BID   [START ON 10/31/2023] Chlorhexidine  Gluconate Cloth  6 each Topical Daily   fenofibrate   160 mg Oral Daily   insulin  aspart  0-5 Units Subcutaneous QHS   insulin  aspart  0-9 Units Subcutaneous TID WC   [START ON 10/31/2023] insulin  glargine-yfgn  30 Units Subcutaneous Daily   mupirocin  ointment  1 Application Nasal BID   rosuvastatin   5 mg Oral Daily   venlafaxine XR  75 mg Oral Q breakfast   Vitamin D (Ergocalciferol)  50,000 Units Oral Q7 days   Continuous Infusions:  sodium chloride      DOBUTamine 5 mcg/kg/min (10/30/23 1400)   norepinephrine (LEVOPHED) Adult infusion 2 mcg/min  (10/30/23 1400)   PRN Meds: albuterol , docusate sodium , polyethylene glycol  Time spent: 55 minutes  Author: ELVAN SOR. MD [facility] 10/30/2023 3:47 PM  To reach On-call, see care teams to locate the attending and reach out to them via [email or web address]. If 7PM-7AM, please contact night-coverage If you still have difficulty reaching the attending provider, please page the [facility] (Director on Call) for [facility] on amion for  assistance.

## 2023-10-30 NOTE — Progress Notes (Signed)
*  PRELIMINARY RESULTS* Echocardiogram 2D Echocardiogram has been performed.  Floydene Harder 10/30/2023, 12:48 PM

## 2023-10-30 NOTE — Plan of Care (Signed)
  Problem: Coping: Goal: Ability to adjust to condition or change in health will improve Outcome: Progressing   Problem: Fluid Volume: Goal: Ability to maintain a balanced intake and output will improve Outcome: Progressing   Problem: Health Behavior/Discharge Planning: Goal: Ability to identify and utilize available resources and services will improve Outcome: Progressing   Problem: Metabolic: Goal: Ability to maintain appropriate glucose levels will improve Outcome: Progressing   Problem: Tissue Perfusion: Goal: Adequacy of tissue perfusion will improve Outcome: Progressing   Problem: Safety: Goal: Ability to remain free from injury will improve Outcome: Progressing

## 2023-10-30 NOTE — Plan of Care (Signed)
  Problem: Fluid Volume: Goal: Ability to maintain a balanced intake and output will improve Outcome: Progressing   Problem: Metabolic: Goal: Ability to maintain appropriate glucose levels will improve Outcome: Progressing   Problem: Education: Goal: Ability to describe self-care measures that may prevent or decrease complications (Diabetes Survival Skills Education) will improve Outcome: Not Progressing   Problem: Coping: Goal: Ability to adjust to condition or change in health will improve Outcome: Not Progressing   Problem: Health Behavior/Discharge Planning: Goal: Ability to identify and utilize available resources and services will improve Outcome: Not Progressing Goal: Ability to manage health-related needs will improve Outcome: Not Progressing   Problem: Nutritional: Goal: Maintenance of adequate nutrition will improve Outcome: Not Progressing   Problem: Education: Goal: Knowledge of General Education information will improve Description: Including pain rating scale, medication(s)/side effects and non-pharmacologic comfort measures Outcome: Not Progressing

## 2023-10-30 NOTE — Consult Note (Signed)
 Advanced Heart Failure Team Consult Note   Primary Physician: Valora Agent, MD Cardiologist:  Dr. Florencio HF Cardiology: Dr. Zenaida Reason for Consultation: Cardiogenic Shock HPI:    Paul Goodman is seen today for evaluation of cardiogenic shock at the request of Dr. Von.   78 y.o. 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  Follows with [facility]. Longstanding history of severely reduced EF <20% and medication noncompliance since 2022. Underwent R/LHC in 2024 showing elevated filling pressures, pulmonary hypertension, and low cardiac index of 1.7, clean cors. NM stress test in 2024 consistent with NICM.   Recently seen by Maryl Carder this month for dyspnea. He has not been using COPD medication. Chest CT and CTPE was ordered for further evaluation, however has not completed.   Presented to pulmonologist in [facility] 10/28/23 and was instructed to be evaluated in ED for hyperglycemia (BG>500) and hyperkalemia (K 5.6) in the setting of medication noncompliance. In ED, BP 138/112, HR 146, O2sat 99% on RA. EKG showed atrial fibrillation with RVR at 131 bpm. Noncompliant with Eliquis . Reported a fall at home recently CT head negative for acute findings. CXR showed small bilateral pleural effusions and central pulmonary edema. Labs in ED notable for K 4.8, CO2 20, glucose 543, BUN/Cr 34/1.3, BNP 987, CBC WNL. He was admitted for further management and cardiology consulted.   He was started back on home PO amio and coreg . Later switched to IV amio and subsequently developed hypotension. Unfortunately received 1L fluid bolus in ED. Home eliquis  and some GDMT resumed (spiro, coreg ). He was given dose of 80 mg IV Lasix with poor urine response and increase in creatinine.  On arrival to the room this morning, patient gray and mottled sitting up in the chair with waxing/waning mentation, requring repeated  stimulation to answer questions. SBP on monitor 60-70s. IV amiodarone  stopped. I urgently assisted nursing staff getting patient back to bed and in reverse trendelenburg position. BP became too low to read on BP cuff. Remained with a weak thready femoral pulse and able to answer questions with repeated stimulation on rapid transfer to ICU. Patient was immediately started on peripheral NE. POCUS by CCM with EF ~10%. DBA ordered.   Formal echo pending.   Home Medications Prior to Admission medications   Medication Sig Start Date End Date Taking? Authorizing Provider  rOPINIRole  (REQUIP  XL) 4 MG 24 hr tablet Take 12-15 mg by mouth at bedtime. 04/11/23 04/10/24 Yes [provider]  albuterol  (PROVENTIL  HFA;VENTOLIN  HFA) 108 (90 BASE) MCG/ACT inhaler Inhale 2 puffs into the lungs every 6 (six) hours as needed for shortness of breath. 06/07/14   [provider]  amiodarone  (PACERONE ) 200 MG tablet Take 1 tablet (200 mg total) by mouth 2 (two) times daily. Patient not taking: Reported on 10/29/2023 06/06/20   Patel, Sona, MD  amLODipine (NORVASC) 10 MG tablet Take 10 mg by mouth daily. Patient not taking: Reported on 10/29/2023 03/26/23   [provider]  apixaban  (ELIQUIS ) 5 MG TABS tablet Take 1 tablet (5 mg total) by mouth 2 (two) times daily. Patient not taking: Reported on 10/29/2023 06/06/20   Patel, Sona, MD  carvedilol  (COREG ) 25 MG tablet Take 1 tablet (25 mg total) by mouth 2 (two) times daily with a meal. Patient not taking: Reported on 10/29/2023 06/06/20   Patel, Sona, MD  Chromium Picolinate (CHROMIUM PICOLATE PO) Take by mouth. Patient  not taking: Reported on 10/29/2023    [provider]  dextromethorphan -guaiFENesin  (MUCINEX  DM) 30-600 MG 12hr tablet Take 1 tablet by mouth every evening.    [provider]  fenofibrate  160 MG tablet Take 160 mg by mouth daily. Patient not taking: Reported on 10/29/2023 06/16/14   [provider]  furosemide  (LASIX) 20 MG tablet Take 20 mg by mouth daily as needed. Patient not taking: Reported on 10/29/2023 12/15/18   [provider]  hydrochlorothiazide  (HYDRODIURIL ) 25 MG tablet Take 25 mg by mouth daily. Patient not taking: Reported on 10/29/2023 06/21/22   [provider]  Insulin  Glargine (BASAGLAR  KWIKPEN) 100 UNIT/ML Inject 60 Units into the skin daily. Patient not taking: Reported on 10/29/2023 06/16/22   [provider]  liraglutide (VICTOZA) 18 MG/3ML SOPN Inject 1.2 mg into the skin once a week. Patient not taking: Reported on 10/29/2023    [provider]  LORazepam  (ATIVAN ) 1 MG tablet Take 0.5-1 mg by mouth daily as needed. TAKE ONE-HALF (1/2) TO ONE (1) TABLET BY MOUTH DAILY AS NEEDED FOR ANXIETY, PANIC, OR THROAT SWELLING Patient not taking: Reported on 04/29/2023 05/30/20   [provider]  losartan  (COZAAR ) 100 MG tablet Take 100 mg by mouth daily. Patient not taking: Reported on 10/29/2023 04/18/22   [provider]  PARoxetine  (PAXIL ) 20 MG tablet Take 20 mg by mouth daily. Patient not taking: Reported on 10/29/2023 12/07/13   [provider]  pramipexole (MIRAPEX) 0.125 MG tablet TAKE 1 TABLET BY MOUTH 2 HOURS BEFORE BEDTIME, MAY TITRATE BY 1 TABLET PER WEEK AS NEEDED MAXIMUM 4 TABLETS. Patient not taking: Reported on 04/29/2023 07/31/22   [provider]  rosuvastatin  (CRESTOR ) 5 MG tablet Take 5 mg by mouth daily. Patient not taking: Reported on 10/29/2023 07/27/22   [provider]  spironolactone  (ALDACTONE ) 25 MG tablet Take 0.5 tablets (12.5 mg total) by mouth daily. Patient not taking: Reported on 10/29/2023 06/07/20   Patel, Sona, MD  VANADIUM PO Take by mouth.    [provider]  Zinc  Sulfate (ZINC  15 PO) Take by mouth. Patient not taking: Reported on 10/29/2023    [provider]    Past Medical History: Past Medical History:  Diagnosis Date   Anxiety    Arthritis    CHF  (congestive heart failure) (HCC)    COPD (chronic obstructive pulmonary disease) (HCC)    Diabetes (HCC)    ED (erectile dysfunction)    Heart murmur    Hyperlipidemia    Hypertension    Melanoma (HCC)    Sleep apnea     Past Surgical History: Past Surgical History:  Procedure Laterality Date   EYE SURGERY Right    cancer   HAND SURGERY Left 1966   KNEE SURGERY Left 1973   RIGHT/LEFT HEART CATH AND CORONARY ANGIOGRAPHY Bilateral 04/29/2023   Procedure: RIGHT/LEFT HEART CATH AND CORONARY ANGIOGRAPHY;  Surgeon: Florencio Cara BIRCH, MD;  Location: [facility] INVASIVE CV LAB;  Service: Cardiovascular;  Laterality: Bilateral;   TONSILLECTOMY  1964    Family History: Family History  Problem Relation Age of Onset   Kidney Stones Father    Kidney disease Neg Hx    Prostate cancer Neg Hx    Kidney cancer Neg Hx    Bladder Cancer Neg Hx     Social History: Social History   Socioeconomic History   Marital status: Married    Spouse name: Not on file   Number of children: Not on  file   Years of education: Not on file   Highest education level: Not on file  Occupational History   Not on file  Tobacco Use   Smoking status: Former    Types: Cigarettes   Smokeless tobacco: Former    Types: Chew   Tobacco comments:    quit 1992  Vaping Use   Vaping status: Never Used  Substance and Sexual Activity   Alcohol use: Not Currently    Alcohol/week: 14.0 standard drinks of alcohol    Types: 14 Standard drinks or equivalent per week   Drug use: Not Currently    Types: Marijuana   Sexual activity: Not Currently  Other Topics Concern   Not on file  Social History Narrative   Not on file   Social Drivers of Health   Financial Resource Strain: Medium Risk (09/17/2023)   Received [facility]   Overall Financial Resource Strain (CARDIA)    Difficulty of Paying Living Expenses: Somewhat hard  Food Insecurity: Food Insecurity Present (10/28/2023)   Hunger Vital  Sign    Worried About Running Out of Food in the Last Year: Sometimes true    Ran Out of Food in the Last Year: Sometimes true  Transportation Needs: No Transportation Needs (10/28/2023)   PRAPARE - Administrator, Civil Service (Medical): No    Lack of Transportation (Non-Medical): No  Physical Activity: Not on file  Stress: Not on file  Social Connections: Socially Integrated (10/28/2023)   Social Connection and Isolation Panel    Frequency of Communication with Friends and Family: Three times a week    Frequency of Social Gatherings with Friends and Family: Three times a week    Attends Religious Services: 1 to 4 times per year    Active Member of Clubs or Organizations: Yes    Attends Banker Meetings: 1 to 4 times per year    Marital Status: Married    Allergies:  No Known Allergies  Objective:    Vital Signs:   Temp:  [97.6 F (36.4 C)-98.6 F (37 C)] 97.9 F (36.6 C) (06/25 1015) Pulse Rate:  [42-110] 79 (06/25 1015) Resp:  [0-33] 28 (06/25 1015) BP: (66-107)/(32-80) 80/70 (06/25 1039) SpO2:  [92 %-100 %] 98 % (06/25 1015) Weight:  [112.2 kg] 112.2 kg (06/25 0500) Last BM Date : 10/28/23  Weight change: Filed Weights   10/28/23 2236 10/29/23 0500 10/30/23 0500  Weight: 111.3 kg 111.3 kg 112.2 kg   Intake/Output:  Intake/Output Summary (Last 24 hours) at 10/30/2023 1043 Last data filed at 10/30/2023 0801 Gross per 24 hour  Intake 475.25 ml  Output 1050 ml  Net -574.75 ml    Physical Exam    General: Acutely-ill appearing. Mottled, ashen Cardiac: JVP to jaw. S1 and S2 present.  Extremities: Cool and wet. Mottled. Ashen.  No peripheral edema.  Neuro: Mentation fluctuant. Requiring repeated stimulation to answer questions.   Telemetry   AF in 70s (personally reviewed)  EKG    As above  Labs   Basic Metabolic Panel: Recent Labs  Lab 10/28/23 1649 10/29/23 0516 10/29/23 0902 10/30/23 0244 10/30/23 0957  NA 131* 136  --   130* 134*  K 4.8 3.9  --  4.3 4.8  CL 100 104  --  100 102  CO2 20* 22  --  21* 19*  GLUCOSE 543* 203*  --  192* 190*  BUN 34* 31*  --  39* 45*  CREATININE 1.30*  0.99  --  1.49* 1.63*  CALCIUM  8.7* 8.3*  --  8.3* 8.4*  MG  --   --  2.0 2.9*  --   PHOS  --   --  4.0 5.9*  --    Liver Function Tests: Recent Labs  Lab 10/30/23 0957  AST 49*  ALT 61*  ALKPHOS 136*  BILITOT 0.5  PROT 6.2*  ALBUMIN 3.2*   CBC: Recent Labs  Lab 10/28/23 1649 10/29/23 0516 10/30/23 0244  WBC 8.6 7.9 11.6*  HGB 14.6 14.1 14.3  HCT 42.6 41.3 42.9  MCV 85.9 85.9 87.4  PLT 221 202 234   BNP (last 3 results) Recent Labs    10/28/23 1649  BNP 987.7*   CBG: Recent Labs  Lab 10/29/23 1148 10/29/23 1615 10/29/23 1957 10/30/23 0753 10/30/23 1021  GLUCAP 367* 337* 246* 215* 209*   Medications:    Current Medications:  acetaminophen   500 mg Oral TID   apixaban   5 mg Oral BID   fenofibrate   160 mg Oral Daily   insulin  aspart  0-5 Units Subcutaneous QHS   insulin  aspart  0-9 Units Subcutaneous TID WC   insulin  glargine-yfgn  45 Units Subcutaneous Daily   rosuvastatin   5 mg Oral Daily   venlafaxine XR  75 mg Oral Q breakfast   Vitamin D (Ergocalciferol)  50,000 Units Oral Q7 days   Infusions:  sodium chloride      DOBUTamine     norepinephrine (LEVOPHED) Adult infusion     Patient Profile   78 y.o. 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 Presenting with atrial fibrillation and cardiogenic shock.  Assessment/Plan   1. Acute on Chronic Systolic Heart Failure>>Cardiogenic Shock - EF <20% since 2022.  - Etiology uncertain. NICM by cath. ?Tachy-mediated, however now with permanent AF. ?HTN CM in the setting of noncompliance.  - on exam this morning: Cold and wet. Mottled, ashen. Severe hypotension with waxing and waning mentation. LA sent. Transferred to ICU.  - POCUS echo by CCM with EF ~10% - Volume  overloaded. Given IV Lasix with poor urine response and worsening AKI. Hold diuresis for now until inotrope started and hypotension improved.  - CCM to place central line. Will need CVP/coox monitoring.  - continue NE for BP support - start DBA 5 mcg/kg/min - hold GDMT with shock - formal echo pending - Suspect that this is likely end-stage HF in the setting of noncompliance. Given advanced age and noncompliance would not be an advanced therapies candidate. Palliative Care consult would be appropriate for GOC discussion.   2. Permanent Atrial Fibrillation - presented with RVR in 130s - noncompliant with eliquis  at home - received coreg  dose this am; hold for now - HR in 70s on tele, suspect will rise with DBA; CTM - stop IV amio for now with hypotension - continue eliquis  5 mg bid  3. AKI  - pre-renal: in the setting of hypoperfusion - worsened with diuresis - Cr 0.9-1.3 at baseline; up to 1.63 today - plan as above  4. Transaminitis - in the setting of CGS and hepatic congestion - will need diuresis eventually  Length of Stay: 1  CRITICAL CARE Performed by: [place] Laurencia Roma  Total critical care time: 14 minutes  -Critical care time was exclusive of separately billable procedures and treating other patients. -Critical care was necessary to treat or prevent imminent or life-threatening deterioration. -Critical care was time spent  personally by me on the following activities: development of treatment plan with patient and/or surrogate as well as nursing, discussions with consultants, evaluation of patient's response to treatment, examination of patient, obtaining history from patient or surrogate, ordering and performing treatments and interventions, ordering and review of laboratory studies, ordering and review of radiographic studies, pulse oximetry and re-evaluation of patient's condition.  [place] Damany Eastman, NP  10/30/2023, 10:43 AM  Advanced Heart Failure Team Pager [phone] (M-F; 7a -  5p)  Please contact [facility] for night-coverage after hours (4p -7a ) and weekends on [email or web address]

## 2023-10-30 NOTE — Progress Notes (Signed)
 Psychiatry came to see the patient today afternoon and he was getting cardiac ultrasound. Will try to see him later in the day or tomorrow.

## 2023-10-30 NOTE — Inpatient Diabetes Management (Signed)
 Inpatient Diabetes Program Recommendations  AACE/ADA: New Consensus Statement on Inpatient Glycemic Control (2015)  Target Ranges:  Prepandial:   less than 140 mg/dL      Peak postprandial:   less than 180 mg/dL (1-2 hours)      Critically ill patients:  140 - 180 mg/dL    Latest Reference Range & Units 10/28/23 16:49  Glucose 70 - 99 mg/dL 456 (HH)  (HH): Data is critically high  Latest Reference Range & Units 10/29/23 05:16  Hemoglobin A1C 4.8 - 5.6 % 12.8 (H)  320 mg/dl  (H): Data is abnormally high  Latest Reference Range & Units 10/29/23 07:36 10/29/23 11:48 10/29/23 16:15 10/29/23 19:57  Glucose-Capillary 70 - 99 mg/dL 769 (H)  3 units Novolog   367 (H)  9 units Novolog   45 units Semglee  @1006   337 (H)  7 units Novolog   246 (H)  2 units Novolog    (H): Data is abnormally high  Latest Reference Range & Units 10/30/23 07:53 10/30/23 10:21  Glucose-Capillary 70 - 99 mg/dL 784 (H)  3 units Novolog   209 (H)  (H): Data is abnormally high  Home DM Meds: Basaglar  60 units daily                              Victoza 1.2 mg Qweek??   Current Orders: Novolog  Sensitive Correction Scale/ SSI (0-9 units) TID AC + HS                            Semglee  45 units daily    ENDO: Dr. Damian with [facility] Last Seen April 2024 A1c at that visit was 7.7% Was told to take:  Lantus  60 units at HS + Ozempic 0.5 mg Qweek + Farxiga  10 mg daily    MD- Note A1c significantly elevated.  Needs to start back on his meds at home.  I do not see anything in the records about him taking Victoza??  ENDO notes form April 2024 had pt on Lantus  + Farxiga  + Ozempic (weekly).  Note CBG 215 this AM  Please consider increasing the Semglee  to 50 units daily    --Will follow patient during hospitalization--  Adina Rudolpho Arrow RN, MSN, CDCES Diabetes Coordinator Inpatient Glycemic Control Team Team Pager: [phone] (8a-5p)

## 2023-10-31 DIAGNOSIS — Z7189 Other specified counseling: Secondary | ICD-10-CM | POA: Diagnosis not present

## 2023-10-31 DIAGNOSIS — I5023 Acute on chronic systolic (congestive) heart failure: Secondary | ICD-10-CM | POA: Diagnosis not present

## 2023-10-31 DIAGNOSIS — I4891 Unspecified atrial fibrillation: Secondary | ICD-10-CM | POA: Diagnosis not present

## 2023-10-31 LAB — GLUCOSE, CAPILLARY
Glucose-Capillary: 133 mg/dL — ABNORMAL HIGH (ref 70–99)
Glucose-Capillary: 198 mg/dL — ABNORMAL HIGH (ref 70–99)
Glucose-Capillary: 258 mg/dL — ABNORMAL HIGH (ref 70–99)
Glucose-Capillary: 227 mg/dL — ABNORMAL HIGH (ref 70–99)

## 2023-10-31 LAB — BASIC METABOLIC PANEL WITH GFR
Anion gap: 7 (ref 5–15)
BUN: 39 mg/dL — ABNORMAL HIGH (ref 8–23)
CO2: 27 mmol/L (ref 22–32)
Calcium: 7.6 mg/dL — ABNORMAL LOW (ref 8.9–10.3)
Chloride: 102 mmol/L (ref 98–111)
Creatinine, Ser: 1.28 mg/dL — ABNORMAL HIGH (ref 0.61–1.24)
GFR, Estimated: 58 mL/min — ABNORMAL LOW (ref >60)
Glucose, Bld: 137 mg/dL — ABNORMAL HIGH (ref 70–99)
Potassium: 3.6 mmol/L (ref 3.5–5.1)
Sodium: 136 mmol/L (ref 135–145)

## 2023-10-31 LAB — COOXEMETRY PANEL
Carboxyhemoglobin: 1.3 % (ref 0.5–1.5)
Methemoglobin: 0.8 % (ref 0.0–1.5)
O2 Saturation: 59 %
Total hemoglobin: 14.2 g/dL (ref 12.0–16.0)
Total oxygen content: 57.8 %

## 2023-10-31 LAB — HEPATIC FUNCTION PANEL
ALT: 51 U/L — ABNORMAL HIGH (ref 0–44)
AST: 41 U/L (ref 15–41)
Albumin: 2.8 g/dL — ABNORMAL LOW (ref 3.5–5.0)
Alkaline Phosphatase: 120 U/L (ref 38–126)
Bilirubin, Direct: 0.2 mg/dL (ref 0.0–0.2)
Indirect Bilirubin: 1 mg/dL — ABNORMAL HIGH (ref 0.3–0.9)
Total Bilirubin: 1.2 mg/dL (ref 0.0–1.2)
Total Protein: 5.7 g/dL — ABNORMAL LOW (ref 6.5–8.1)

## 2023-10-31 LAB — PHOSPHORUS: Phosphorus: 4.3 mg/dL (ref 2.5–4.6)

## 2023-10-31 LAB — CBC
HCT: 40.3 % (ref 39.0–52.0)
Hemoglobin: 13.4 g/dL (ref 13.0–17.0)
MCH: 29.2 pg (ref 26.0–34.0)
MCHC: 33.3 g/dL (ref 30.0–36.0)
MCV: 87.8 fL (ref 80.0–100.0)
Platelets: 171 10*3/uL (ref 150–400)
RBC: 4.59 MIL/uL (ref 4.22–5.81)
RDW: 13.1 % (ref 11.5–15.5)
WBC: 10.1 10*3/uL (ref 4.0–10.5)
nRBC: 0 % (ref 0.0–0.2)

## 2023-10-31 LAB — MAGNESIUM: Magnesium: 2.3 mg/dL (ref 1.7–2.4)

## 2023-10-31 MED ORDER — POLYETHYLENE GLYCOL 3350 17 G PO PACK
17.0000 g | PACK | Freq: Two times a day (BID) | ORAL | Status: DC
  Administered 2023-10-31 – 2023-11-03 (×8): 17 g via ORAL
  Filled 2023-10-31 (×10): qty 1

## 2023-10-31 MED ORDER — BISACODYL 10 MG RE SUPP
10.0000 mg | Freq: Every day | RECTAL | Status: DC | PRN
  Administered 2023-11-01: 10 mg via RECTAL
  Filled 2023-10-31 (×2): qty 1

## 2023-10-31 MED ORDER — BISACODYL 5 MG PO TBEC
10.0000 mg | DELAYED_RELEASE_TABLET | Freq: Once | ORAL | Status: AC
  Administered 2023-10-31: 10 mg via ORAL
  Filled 2023-10-31: qty 2

## 2023-10-31 MED ORDER — BISACODYL 5 MG PO TBEC
10.0000 mg | DELAYED_RELEASE_TABLET | Freq: Every day | ORAL | Status: DC
  Administered 2023-11-01: 10 mg via ORAL
  Filled 2023-10-31 (×2): qty 2

## 2023-10-31 MED ORDER — FUROSEMIDE 10 MG/ML IJ SOLN
160.0000 mg | Freq: Once | INTRAVENOUS | Status: AC
  Administered 2023-10-31: 160 mg via INTRAVENOUS
  Filled 2023-10-31: qty 16

## 2023-10-31 MED ORDER — POTASSIUM CHLORIDE CRYS ER 20 MEQ PO TBCR
40.0000 meq | EXTENDED_RELEASE_TABLET | Freq: Once | ORAL | Status: AC
  Administered 2023-10-31: 40 meq via ORAL
  Filled 2023-10-31: qty 2

## 2023-10-31 MED ORDER — ONDANSETRON HCL 4 MG/2ML IJ SOLN: 4.0000 mg | Freq: Four times a day (QID) | INTRAMUSCULAR | Status: DC | PRN

## 2023-10-31 MED ORDER — BUTALBITAL-APAP-CAFFEINE 50-325-40 MG PO TABS
1.0000 | ORAL_TABLET | Freq: Four times a day (QID) | ORAL | Status: DC | PRN
  Administered 2023-10-31 – 2023-11-05 (×4): 1 via ORAL
  Filled 2023-10-31 (×4): qty 1

## 2023-10-31 NOTE — Progress Notes (Signed)
 [facility] CLINIC CARDIOLOGY PROGRESS NOTE       Patient ID: Paul Goodman MRN: [id number] DOB/AGE: 1945-12-13 78 y.o.  Admit date: 10/28/2023 Referring Physician Dr. Von Primary Physician Valora Agent, MD Primary Cardiologist Dr. Marshia Blanch Reason for Consultation AF RVR  HPI: Paul Goodman is a 78 y.o. male  with a past medical history of permanent atrial fibrillation (on Eliquis ), chronic HFrEF (EF < 20%), nonischemic cardiomyopathy, pulmonary hypertension, hyperlipidemia, hypertension, history CVA, diabetes mellitus, OSA (on CPAP), obesity who presented to the ED on 10/28/2023 sent by pulmonology due to extremely elevated blood glucose and hyperkalemia.  Upon admission EKG revealed atrial fibrillation RVR with rates in the 140s. Cardiology was consulted for further evaluation.   Interval History: -Patient seen and examined this AM and sitting up in hospital bed eating breakfast with wife at bedside.  Patient states he feels better and reports SOB is improving.  Patient is more himself today and able to answer questions appropriately. - BP and heart rate much improved today. - UOP 1.5L yesterday with improving renal function. S/p IV lasix 160 mg.   -Patient remains on 2L with stable SpO2.  - Continues to be on dobutamine drip, now off levo.  Pertinent Cardiac History (Most recent) RHC/LHC (04/29/2023)   There is severe left ventricular systolic dysfunction.   LV end diastolic pressure is mildly elevated.   The left ventricular ejection fraction is less than 25% by visual estimate.   Hemodynamic findings consistent with pulmonary hypertension.   No indication for antiplatelet therapy at this time .  Right heart -Wedge mean of 20 -PA mean 24   Left ventriculogram -Depressed left ventricular function globally left ventricular enlargement EF of around 25%   Coronaries  -Normal coronaries left dominant system  -Study consistent with a nonischemic  cardiomyopathy  Review of systems complete and found to be negative unless listed above    Past Medical History:  Diagnosis Date   Anxiety    Arthritis    CHF (congestive heart failure) (HCC)    COPD (chronic obstructive pulmonary disease) (HCC)    Diabetes (HCC)    ED (erectile dysfunction)    Heart murmur    Hyperlipidemia    Hypertension    Melanoma (HCC)    Sleep apnea     Past Surgical History:  Procedure Laterality Date   EYE SURGERY Right    cancer   HAND SURGERY Left 1966   KNEE SURGERY Left 1973   RIGHT/LEFT HEART CATH AND CORONARY ANGIOGRAPHY Bilateral 04/29/2023   Procedure: RIGHT/LEFT HEART CATH AND CORONARY ANGIOGRAPHY;  Surgeon: Florencio Cara BIRCH, MD;  Location: [facility] INVASIVE CV LAB;  Service: Cardiovascular;  Laterality: Bilateral;   TONSILLECTOMY  1964    Medications Prior to Admission  Medication Sig Dispense Refill Last Dose/Taking   rOPINIRole  (REQUIP  XL) 4 MG 24 hr tablet Take 12-15 mg by mouth at bedtime.   Past Week   albuterol  (PROVENTIL  HFA;VENTOLIN  HFA) 108 (90 BASE) MCG/ACT inhaler Inhale 2 puffs into the lungs every 6 (six) hours as needed for shortness of breath.      amiodarone  (PACERONE ) 200 MG tablet Take 1 tablet (200 mg total) by mouth 2 (two) times daily. (Patient not taking: Reported on 10/29/2023) 60 tablet 2 Not Taking   amLODipine (NORVASC) 10 MG tablet Take 10 mg by mouth daily. (Patient not taking: Reported on 10/29/2023)   Not Taking   apixaban  (ELIQUIS ) 5 MG TABS tablet Take 1 tablet (5 mg total) by mouth 2 (  two) times daily. (Patient not taking: Reported on 10/29/2023) 60 tablet 3 Not Taking   carvedilol  (COREG ) 25 MG tablet Take 1 tablet (25 mg total) by mouth 2 (two) times daily with a meal. (Patient not taking: Reported on 10/29/2023) 60 tablet 3 Not Taking   Chromium Picolinate (CHROMIUM PICOLATE PO) Take by mouth. (Patient not taking: Reported on 10/29/2023)   Not Taking   dextromethorphan -guaiFENesin  (MUCINEX  DM) 30-600 MG 12hr tablet  Take 1 tablet by mouth every evening.      fenofibrate  160 MG tablet Take 160 mg by mouth daily. (Patient not taking: Reported on 10/29/2023)   Not Taking   furosemide (LASIX) 20 MG tablet Take 20 mg by mouth daily as needed. (Patient not taking: Reported on 10/29/2023)   Not Taking   hydrochlorothiazide  (HYDRODIURIL ) 25 MG tablet Take 25 mg by mouth daily. (Patient not taking: Reported on 10/29/2023)   Not Taking   Insulin  Glargine (BASAGLAR  KWIKPEN) 100 UNIT/ML Inject 60 Units into the skin daily. (Patient not taking: Reported on 10/29/2023)   Not Taking   liraglutide (VICTOZA) 18 MG/3ML SOPN Inject 1.2 mg into the skin once a week. (Patient not taking: Reported on 10/29/2023)   Not Taking   LORazepam  (ATIVAN ) 1 MG tablet Take 0.5-1 mg by mouth daily as needed. TAKE ONE-HALF (1/2) TO ONE (1) TABLET BY MOUTH DAILY AS NEEDED FOR ANXIETY, PANIC, OR THROAT SWELLING (Patient not taking: Reported on 04/29/2023)      losartan  (COZAAR ) 100 MG tablet Take 100 mg by mouth daily. (Patient not taking: Reported on 10/29/2023)   Not Taking   PARoxetine  (PAXIL ) 20 MG tablet Take 20 mg by mouth daily. (Patient not taking: Reported on 10/29/2023)   Not Taking   pramipexole (MIRAPEX) 0.125 MG tablet TAKE 1 TABLET BY MOUTH 2 HOURS BEFORE BEDTIME, MAY TITRATE BY 1 TABLET PER WEEK AS NEEDED MAXIMUM 4 TABLETS. (Patient not taking: Reported on 04/29/2023)   Not Taking   rosuvastatin  (CRESTOR ) 5 MG tablet Take 5 mg by mouth daily. (Patient not taking: Reported on 10/29/2023)   Not Taking   spironolactone  (ALDACTONE ) 25 MG tablet Take 0.5 tablets (12.5 mg total) by mouth daily. (Patient not taking: Reported on 10/29/2023) 30 tablet 3 Not Taking   VANADIUM PO Take by mouth.      Zinc  Sulfate (ZINC  15 PO) Take by mouth. (Patient not taking: Reported on 10/29/2023)   Not Taking   Social History   Socioeconomic History   Marital status: Married    Spouse name: Not on file   Number of children: Not on file   Years of education: Not  on file   Highest education level: Not on file  Occupational History   Not on file  Tobacco Use   Smoking status: Former    Types: Cigarettes   Smokeless tobacco: Former    Types: Chew   Tobacco comments:    quit 1992  Vaping Use   Vaping status: Never Used  Substance and Sexual Activity   Alcohol use: Not Currently    Alcohol/week: 14.0 standard drinks of alcohol    Types: 14 Standard drinks or equivalent per week   Drug use: Not Currently    Types: Marijuana   Sexual activity: Not Currently  Other Topics Concern   Not on file  Social History Narrative   Not on file   Social Drivers of Health   Financial Resource Strain: Medium Risk (09/17/2023)   Received from [facility]   Overall  Financial Resource Strain (CARDIA)    Difficulty of Paying Living Expenses: Somewhat hard  Food Insecurity: Food Insecurity Present (10/28/2023)   Hunger Vital Sign    Worried About Running Out of Food in the Last Year: Sometimes true    Ran Out of Food in the Last Year: Sometimes true  Transportation Needs: No Transportation Needs (10/28/2023)   PRAPARE - Administrator, Civil Service (Medical): No    Lack of Transportation (Non-Medical): No  Physical Activity: Not on file  Stress: Not on file  Social Connections: Socially Integrated (10/28/2023)   Social Connection and Isolation Panel    Frequency of Communication with Friends and Family: Three times a week    Frequency of Social Gatherings with Friends and Family: Three times a week    Attends Religious Services: 1 to 4 times per year    Active Member of Clubs or Organizations: Yes    Attends Banker Meetings: 1 to 4 times per year    Marital Status: Married  Catering manager Violence: Not At Risk (10/28/2023)   Humiliation, Afraid, Rape, and Kick questionnaire    Fear of Current or Ex-Partner: No    Emotionally Abused: No    Physically Abused: No    Sexually Abused: No    Family History   Problem Relation Age of Onset   Kidney Stones Father    Kidney disease Neg Hx    Prostate cancer Neg Hx    Kidney cancer Neg Hx    Bladder Cancer Neg Hx      Vitals:   10/31/23 0830 10/31/23 0845 10/31/23 0900 10/31/23 1000  BP: 136/81 110/73 112/70 111/75  Pulse: (!) 139 70 100 (!) 51  Resp: (!) 22 (!) 25 15 (!) 28  Temp:      TempSrc:      SpO2: 99% 100% 100% 91%  Weight:      Height:        PHYSICAL EXAM General: Chronically ill appearing elderly male, well nourished, in no acute distress. HEENT: Normocephalic and atraumatic. Neck: No JVD.   Lungs: Normal respiratory effort on 2L.  Diminished breath sounds bilaterally at bases Heart: Irregularly irregular, controlled rate. Normal S1 and S2 without gallops or murmurs.  Abdomen: Non-distended appearing.  Msk: Normal strength and tone for age. Extremities: Warm and well perfused.  No cyanosis, clubbing.  LEE Ace-wrapped, +1 pedal edema   Labs: Basic Metabolic Panel: Recent Labs    10/30/23 0244 10/30/23 0957 10/31/23 0354  NA 130* 134* 136  K 4.3 4.8 3.6  CL 100 102 102  CO2 21* 19* 27  GLUCOSE 192* 190* 137*  BUN 39* 45* 39*  CREATININE 1.49* 1.63* 1.28*  CALCIUM  8.3* 8.4* 7.6*  MG 2.9*  --  2.3  PHOS 5.9*  --  4.3   Liver Function Tests: Recent Labs    10/30/23 1227 10/31/23 0354  AST 55* 41  ALT 65* 51*  ALKPHOS 152* 120  BILITOT 1.0 1.2  PROT 6.6 5.7*  ALBUMIN 3.4* 2.8*   No results for input(s): LIPASE, AMYLASE in the last 72 hours. CBC: Recent Labs    10/30/23 0244 10/31/23 0354  WBC 11.6* 10.1  HGB 14.3 13.4  HCT 42.9 40.3  MCV 87.4 87.8  PLT 234 171   Cardiac Enzymes: No results for input(s): CKTOTAL, CKMB, CKMBINDEX, TROPONINIHS in the last 72 hours. BNP: Recent Labs    10/28/23 1649  BNP 987.7*   D-Dimer: No results for input(s):  DDIMER in the last 72 hours. Hemoglobin A1C: Recent Labs    10/29/23 0516  HGBA1C 12.8*   Fasting Lipid Panel: No results  for input(s): CHOL, HDL, LDLCALC, TRIG, CHOLHDL, LDLDIRECT in the last 72 hours. Thyroid Function Tests: Recent Labs    10/28/23 1649  TSH 1.812   Anemia Panel: Recent Labs    10/29/23 0902  VITAMINB12 546     Radiology: US  Abdomen Limited RUQ (LIVER/GB) Result Date: 10/30/2023 CLINICAL DATA:  Elevated LFTs. EXAM: ULTRASOUND ABDOMEN LIMITED RIGHT UPPER QUADRANT COMPARISON:  None available FINDINGS: Gallbladder: No gallstones or wall thickening visualized. No sonographic Murphy sign noted by sonographer. Common bile duct: Diameter: 5 mm Liver: There is diffuse increased liver echogenicity most commonly seen in the setting of fatty infiltration. Superimposed inflammation or fibrosis is not excluded. Clinical correlation is recommended. Portal vein is patent on color Doppler imaging with normal direction of blood flow towards the liver. Other: None. IMPRESSION: Fatty liver, otherwise unremarkable right upper quadrant ultrasound. Electronically Signed   By: Vanetta Chou M.D.   On: 10/30/2023 16:18   DG Chest Port 1 View Result Date: 10/30/2023 CLINICAL DATA:  Central line placement. EXAM: PORTABLE CHEST 1 VIEW COMPARISON:  October 29, 2023 FINDINGS: Since the prior study there is been interval placement of a left-sided internal jugular venous catheter. Its distal tip is seen at the level of the right hilum within the distal portion of the superior vena cava. The cardiac silhouette is mildly enlarged and unchanged in size. Low lung volumes are noted. Stable prominence of the pulmonary vasculature is seen with associated increased interstitial lung markings. Mild to moderate severity areas of atelectasis and/or infiltrate are present within the bilateral lung bases. No pleural effusion or pneumothorax is identified. Multilevel degenerative changes are seen throughout the thoracic spine. IMPRESSION: 1. Interval left-sided internal jugular venous catheter placement and positioning, as  described above, without evidence of pneumothorax. 2. Stable cardiomegaly with mild to moderate severity pulmonary vascular congestion and pulmonary edema. 3. Mild to moderate severity bibasilar atelectasis and/or infiltrate. Electronically Signed   By: Suzen Dials M.D.   On: 10/30/2023 12:30   DG Chest Port 1 View Result Date: 10/29/2023 CLINICAL DATA:  Congestive heart failure. EXAM: PORTABLE CHEST 1 VIEW COMPARISON:  February 09, 2023. FINDINGS: Mild cardiomegaly is noted with mild central pulmonary vascular congestion. Bibasilar pulmonary edema is noted. Bony thorax is unremarkable. IMPRESSION: Mild cardiomegaly with mild central pulmonary vascular congestion and bilateral pulmonary edema. Electronically Signed   By: Lynwood Landy Raddle M.D.   On: 10/29/2023 16:30   CT HEAD WO CONTRAST ( ) Result Date: 10/28/2023 CLINICAL DATA:  Recent syncopal episode EXAM: CT HEAD WITHOUT CONTRAST TECHNIQUE: Contiguous axial images were obtained from the base of the skull through the vertex without intravenous contrast. RADIATION DOSE REDUCTION: This exam was performed according to the departmental dose-optimization program which includes automated exposure control, adjustment of the mA and/or kV according to patient size and/or use of iterative reconstruction technique. COMPARISON:  02/09/2023 FINDINGS: Brain: No evidence of acute infarction, hemorrhage, hydrocephalus, extra-axial collection or mass lesion/mass effect. Remote right frontoparietal infarct is again seen and stable. Vascular calcifications are noted in the centrum semi ovale stable from the prior exam. Basal ganglia calcifications are seen as well. Vascular: No hyperdense vessel or unexpected calcification. Skull: Normal. Negative for fracture or focal lesion. Sinuses/Orbits: No acute finding. Other: None. IMPRESSION: Chronic right frontoparietal infarct.  No acute abnormality noted. Electronically Signed   By: Oneil Devonshire  M.D.   On: 10/28/2023 19:47     ECHO pending  TELEMETRY reviewed by me 10/31/2023: Atrial fibrillation, rate 90s to 100s.  EKG reviewed by me: Atrial fibrillation RVR rate 131 bpm  Data reviewed by me 10/31/2023: last 24h vitals tele labs imaging I/O hospitalist progress notes.  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 male  with a past medical history of permanent atrial fibrillation (on Eliquis ), chronic HFrEF (EF < 20%), nonischemic cardiomyopathy, pulmonary hypertension, hyperlipidemia, hypertension history CVA, diabetes mellitus, OSA (on CPAP), obesity who presented to the ED on 10/28/2023 sent by pulmonology due to extremely elevated blood glucose and hyperkalemia.  Upon admission EKG revealed atrial fibrillation RVR with rates in the 140s. Cardiology was consulted for further evaluation.   # Atrial fibrillation RVR # Permanent atrial fibrillation Patient denies chest pain, palpitations. EKG in ED with atrial fibrillation rate 131 bpm. Patient started on IV amio infusion. Patient takes PO amio 200 mg at home. Per tele in AF with improving HR in 70-80s. -Monitor and replenish electrolytes for a goal K >4, Mag >2  -Holding amio per advanced heart failure team. -Continue Eliquis  5 mg twice daily for stroke risk reduction. -D/C Coreg  as stated below.  # Cardiogenic shock # Acute on chronic HFrEF # Nonischemic cardiomyopathy # Medication  noncompliance Patient with stated noncompliance to medication.  Patient states he does not take his cardiac medications as prescribed at home because he believes the medications do not help him.  Presents with worsening SOB, orthopnea and LEE. CXR with mild cardiomegaly and pulmonary vascular congestion and bilateral pulmonary edema. BNP elevated at 990. Poor UOP yesterday (06/24) s/p IV lasix 80 mg. Patient confused, cold, hypotensive this AM (06/25), transferred to ICU and started on levo and dobutamine infusion due to low perfusion/low CO, cardiogenic shock. Lactic acid 1.2 (06/25). POCUS echo revealed EF around 10%. On 06/26 weaned off levo gtt around 1600 on 06/25 and continues to be on dobutamine. BP improving. -Echo pending.  Further recommendations pending results. -Advanced heart failure consulted, appreciate recommendations.  -Continue dobutamine per heart failure team.  -IV lasix 160 mg per heart failure team. Closely monitor renal function and UOP. (Patient prescribed Lasix 20 mg daily at home however states he does not take this.) -Holding Coreg  for now. Plan to resume when hemodynamically stable and closer to euvolemia.  -Holding spiro for now. -Hold off on starting dapagliflozin  due to uncontrolled A1C of 12.8% ([place] $ 38.08) -Plan to resume and optimize GDMT when patient off inotrope and hemodynamically stable.  -There has been brief outpatient discussion about possible defibrillator therapy with Dr. Tobie if patient remains on GDMT and does not show improvement to cardiac function.  # Hypertension # Hyperlipidemia Patient hypotensive, started on dobutamine and levo infusion.  -Continue rosuvastatin  5 mg daily, fenofibrate  160 mg daily.   This patient's plan of care was discussed and created with Dr.  Callwood and he is in agreement.  Signed: Dorene Comfort, PA-C  10/31/2023, 11:04 AM [facility] Cardiology

## 2023-10-31 NOTE — Progress Notes (Signed)
 OT Cancellation Note  Patient Details Name: Paul Goodman MRN: [id number] DOB: 08-11-1945   Cancelled Treatment:    Reason Eval/Treat Not Completed: Medical issues which prohibited therapy. Pt currently transferred to higher level of care due to medical concerns. Will complete current order at this time. Please re-order when pt is medically stable and able to participate with therapy.   Anay Rathe L. Joelyn Lover, OTR/L  10/31/23, 8:17 AM

## 2023-10-31 NOTE — Plan of Care (Signed)

## 2023-10-31 NOTE — Progress Notes (Addendum)
 [facility] Progress Note  Patient: Paul Goodman    FMW:[id number]  DOA: 10/28/2023     Date of Service: the patient was seen and examined on 10/31/2023  Chief Complaint  Patient presents with   Hyperglycemia   Brief hospital course: Paul Goodman is a 79 y.o. male with medical history significant of HTN HLD, DM, COPD, sCHF with EF < 20%, stroke, CKD 32, BPH, A-fib on Eliquis , OSA on CPAP, obesity, depression with anxiety, who presents with abnormal lab with hyperglycemia and hyperkalemia.   Pt had a follow-up visit with his pulmonologist in [facility] today, and was found to have hyperglycemia and hyperkalemia.  Blood sugar is above 500 and and potassium is 5.6.  Patient was sent to ED for further evaluation and treatment.  He state that he stopped taking his diabetic medications and some other medications for more than 3 months.  He said he was asked to be evaluated by his physician before giving him more prescription, but he did not have appointment.  He states that sometimes he has heart racing, no chest pain.  He has mild dry cough and mild SOB, polyuria with increased urinary frequency, but no dysuria or burning with urination.SABRA  He states that he fell 2 weeks ago, no LOC. denies any other complaints   Pt is found to have A-fib with RVR, heart rate 110-140s in ED   Data reviewed independently and ED Course:  Vitals; afebrile, BP 136/112, HR 110--140's, RR 18 O2 sat 99% on RA WBC 8.6, BMP: BG 543, anion gap 11, BNP 987.1 worsening renal function with creatinine 1.30, BUN 34 and GFR 57 (recent baseline creatinine 0.92 on 02/09/2023) negative UA, CT of head negative for acute issues, and showed chronic infarction.  Cardiology and psych were consulted TRH was consulted for admission and further management as below   6/25 patient was transferred to ICU under critical care service due to low output cardiac failure.  Patient was started on Levophed and dobutamine.  Advanced heart  failure team is also following. [facility] is signing off and will be available to take over when patient will be stable to downgrade from ICU.  6/26 patient was stabilized, Levophed weaned off and transition back to [facility] service.  Currently patient is on dobutamine.  Assessment and Plan:  # Acute on chronic systolic CHF exacerbation  TTE on 05/26/2020: LVEF<20%.  elevated BNP 987, significant lower extremity edema, and SOB S/p Lasix 80 mg one-time dose given Resumed Aldactone  25 mg p.o. daily Cardiology following 6/25 patient was noticed in low cardiac output failure and patient was immediately transferred to ICU, heart failure team was consulted and patient was started on Levophed and dobutamine IV infusion. Patient was transferred to critical care service. 6/26 Levophed weaned off, currently patient is on dobutamine 6/26 Lasix 160 mg IV one-time dose given   # Atrial fibrillation with RVR: HR is 110-140s.  Possibly due to medication noncompliance. - start home amiodarone  200 mg daily and Coreg  25 mg twice daily - Continue Eliquis  (I discussed with patient, he agreed to restart medications which are necessary for him) - TSH level wnl 6/24 magnesium  4 g IV one-time dose given by cardiology - s/p Amiodarone  drip, d/c'd on 6/25 and patient was started on Levophed and dobutamine 6/26 Levophed weaned off, currently on dobutamine Cardiology consulted   # Acute renal failure superimposed on stage 2 chronic kidney disease:  - s/p Lasix and spironolactone  - Hold HCTZ and Cozaar  - Patient received  1 L normal saline in ED 6/25 patient was noticed to have high-output cardiac failure, low blood pressure.  Started Levophed and dobutamine.  Patient was transferred to ICU. 6/26 Lasix 160 mg IV one-time dose given  # Hypertension:  Patient was on Coreg  25 mg p.o. twice daily.  Which was discontinued on 6/25  6/25 patient was noticed to have high-output cardiac failure, low blood pressure.  Started  Levophed and dobutamine.  Patient was transferred to ICU. 6/26 Levophed weaned off, currently on dobutamine    # Type II diabetes mellitus with renal manifestations:  Recent A1c 7.7, poorly controlled.  Patient is supposed to take Victoza and glargine insulin  60 units daily, but not taking medications currently.  Blood sugar 543 with normal anion gap 11 6/24 Hemoglobin A1c 12.8 uncontrolled - Start glargine insulin  45 units daily - SSI   # Depression with anxiety -As needed Ativan  6/24 discontinued paroxetine , started Effexor seen by psych   # COPD (chronic obstructive pulmonary disease): No wheezes -Bronchodilators as needed Mucinex     # Hyperlipidemia: on Crestor  5 mg and started fenofibrate    # Stroke: Crestor  - Patient is on Eliquis  for A-fib   # Vitamin D insufficiency: started vitamin D 50,000 units p.o. weekly, follow with PCP to repeat vitamin D level after 3 to 6 months.   # Fall at home, initial encounter: CT -head negative for injury - Continue fall precaution - PT/OT eval when stable  # OSA: on CPAP  # Palliative care consulted Long-term prognosis is poor due to extensive cardiac history and depression.  As per palliative care, patient was made DNR/DNI. He wants to go home with hospice when he is medically optimized to do so.   Body mass index is 31.16 kg/m.  Interventions:  Diet: Heart healthy/carb modified, fluid restriction 1.5 L/day DVT Prophylaxis: Eliquis   Advance goals of care discussion: Full code  Family Communication: family was not present at bedside, at the time of interview.  The pt provided permission to discuss medical plan with the family. Opportunity was given to ask question and all questions were answered satisfactorily.  6/25 I called patient's wife but she did not answer the phone call. Discussed with ICU team, they will notify family regarding patient's current condition  Disposition:  Pt is from home, admitted with A-fib with RVR,  CHF and hyperglycemia, still has CHF and A-fib. 6/25 patient was found to have low output cardiac failure, started on Levophed and dobutamine.  Patient was transferred to ICU for critical care management and heart failure team is also following. which precludes a safe discharge. Discharge to home vs SNF TBD, when stable, will definitely need few days to improve  Subjective: No significant events overnight, yesterday morning patient was transferred to ICU, patient was on Levophed and dopamine, patient improved and downgraded to hospitalist service. Today patient is feeling improvement, denied any worsening of shortness of breath, no chest pain or palpitation. Patient is feeling generalized weakness and tired and fatigued no any other specific complaints. Patient also complaining of migraine headache, willing to try Fioricet.  Physical Exam: General: NAD, lying comfortably.   Not in acute distress, affect depressed, no agitation noticed Eyes: PERRLA ENT: Oral Mucosa Clear, moist  Neck: no JVD,  Cardiovascular: Irregular rhythm, no Murmur,  Respiratory: good respiratory effort, Bilateral Air entry equal and Decreased, bibasilar Crackles, no wheezes Abdomen: Bowel Sound present, Soft and no tenderness,  Skin: no rashes Extremities: 4+ BL lower extremity edema, no calf tenderness Neurologic:  without any new focal findings Gait not checked due to patient safety concerns  Vitals:   10/31/23 1000 10/31/23 1100 10/31/23 1200 10/31/23 1300  BP: 111/75 116/75 137/72 136/77  Pulse: (!) 51 (!) 55 (!) 33 (!) 52  Resp: (!) 28 (!) 24 19 14   Temp:      TempSrc:      SpO2: 91% 95% 94% 97%  Weight:      Height:        Intake/Output Summary (Last 24 hours) at 10/31/2023 1518 Last data filed at 10/31/2023 0831 Gross per 24 hour  Intake 211.4 ml  Output 1500 ml  Net -1288.6 ml   Filed Weights   10/29/23 0500 10/30/23 0500 10/31/23 0400  Weight: 111.3 kg 112.2 kg 110.1 kg    Data Reviewed: I  have personally reviewed and interpreted daily labs, tele strips, imagings as discussed above. I reviewed all nursing notes, pharmacy notes, vitals, pertinent old records I have discussed plan of care as described above with RN and patient/family.  CBC: Recent Labs  Lab 10/28/23 1649 10/29/23 0516 10/30/23 0244 10/31/23 0354  WBC 8.6 7.9 11.6* 10.1  HGB 14.6 14.1 14.3 13.4  HCT 42.6 41.3 42.9 40.3  MCV 85.9 85.9 87.4 87.8  PLT 221 202 234 171   Basic Metabolic Panel: Recent Labs  Lab 10/28/23 1649 10/29/23 0516 10/29/23 0902 10/30/23 0244 10/30/23 0957 10/31/23 0354  NA 131* 136  --  130* 134* 136  K 4.8 3.9  --  4.3 4.8 3.6  CL 100 104  --  100 102 102  CO2 20* 22  --  21* 19* 27  GLUCOSE 543* 203*  --  192* 190* 137*  BUN 34* 31*  --  39* 45* 39*  CREATININE 1.30* 0.99  --  1.49* 1.63* 1.28*  CALCIUM  8.7* 8.3*  --  8.3* 8.4* 7.6*  MG  --   --  2.0 2.9*  --  2.3  PHOS  --   --  4.0 5.9*  --  4.3    Studies: US  Abdomen Limited RUQ (LIVER/GB) Result Date: 10/30/2023 CLINICAL DATA:  Elevated LFTs. EXAM: ULTRASOUND ABDOMEN LIMITED RIGHT UPPER QUADRANT COMPARISON:  None available FINDINGS: Gallbladder: No gallstones or wall thickening visualized. No sonographic Murphy sign noted by sonographer. Common bile duct: Diameter: 5 mm Liver: There is diffuse increased liver echogenicity most commonly seen in the setting of fatty infiltration. Superimposed inflammation or fibrosis is not excluded. Clinical correlation is recommended. Portal vein is patent on color Doppler imaging with normal direction of blood flow towards the liver. Other: None. IMPRESSION: Fatty liver, otherwise unremarkable right upper quadrant ultrasound. Electronically Signed   By: Vanetta Chou M.D.   On: 10/30/2023 16:18    Scheduled Meds:  acetaminophen   500 mg Oral TID   apixaban   5 mg Oral BID   [START ON 11/01/2023] bisacodyl  10 mg Oral QHS   Chlorhexidine  Gluconate Cloth  6 each Topical Daily    fenofibrate   160 mg Oral Daily   insulin  aspart  0-5 Units Subcutaneous QHS   insulin  aspart  0-9 Units Subcutaneous TID WC   insulin  glargine-yfgn  30 Units Subcutaneous Daily   mupirocin  ointment  1 Application Nasal BID   polyethylene glycol  17 g Oral BID   potassium chloride   40 mEq Oral Once   rosuvastatin   5 mg Oral Daily   venlafaxine XR  75 mg Oral Q breakfast   Vitamin D (Ergocalciferol)  50,000 Units Oral Q7  days   Continuous Infusions:  DOBUTamine 5 mcg/kg/min (10/31/23 0831)   norepinephrine (LEVOPHED) Adult infusion Stopped (10/30/23 1613)   PRN Meds: albuterol , bisacodyl, butalbital -acetaminophen -caffeine , ondansetron  (ZOFRAN ) IV  Time spent: 55 minutes  Author: ELVAN SOR. MD [facility] 10/31/2023 3:18 PM  To reach On-call, see care teams to locate the attending and reach out to them via [email or web address]. If 7PM-7AM, please contact night-coverage If you still have difficulty reaching the attending provider, please page the [facility] (Director on Call) for [facility] on amion for assistance.

## 2023-10-31 NOTE — Progress Notes (Addendum)
 PHARMACY CONSULT NOTE - FOLLOW UP  Pharmacy Consult for Electrolyte Monitoring and Replacement   Recent Labs: Potassium (mmol/L)  Date Value  10/31/2023 3.6  04/17/2012 3.8   Magnesium  (mg/dL)  Date Value  93/73/7974 2.3   Calcium  (mg/dL)  Date Value  93/73/7974 7.6 (L)   Calcium , Total (mg/dL)  Date Value  87/87/7986 9.0   Albumin (g/dL)  Date Value  93/73/7974 2.8 (L)  04/17/2012 4.1   Phosphorus (mg/dL)  Date Value  93/73/7974 4.3   Sodium (mmol/L)  Date Value  10/31/2023 136  04/17/2012 139     Assessment: 78 y.o. 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 Upon admission EKG revealed atrial fibrillation RVR with rates in the 140s. Pharmacy is asked to follow and replace electrolytes while in CCU  Diuretics: furosemide 160 mg IV x 1  Goal of Therapy:  Potassium 4.0 - 5.1 mmol/L Magnesium  2.0 - 2.4 mg/dL All Other Electrolytes WNL  Plan:  ---40 meq po KCl x 2 ---recheck electrolytes in am  Adriana JONETTA Bolster ,PharmD Clinical Pharmacist 10/31/2023 7:06 AM

## 2023-10-31 NOTE — Progress Notes (Addendum)
 Advanced Heart Failure Rounding Note  Cardiologist: None  Chief Complaint: Cardiogenic shock Subjective:    Co-ox 59%. Unsure why DBA was turned down to 2.5 earlier this morning. Now off NE.   SCr 1.63-1.28. Diuresed yesterday with 160 IV x1. -1.5L UOP. CVP 9  Feels good this morning. Mentation is much better today. Able to hold conversation, very talkative.   Objective:   Weight Range: 110.1 kg Body mass index is 31.16 kg/m.   Vital Signs:   Temp:  [97 F (36.1 C)-98 F (36.7 C)] 97.7 F (36.5 C) (06/26 0400) Pulse Rate:  [31-145] 39 (06/26 0715) Resp:  [0-33] 25 (06/26 0715) BP: (66-137)/(32-109) 105/65 (06/26 0715) SpO2:  [92 %-100 %] 95 % (06/26 0715) Weight:  [110.1 kg] 110.1 kg (06/26 0400) Last BM Date : 10/28/23  Weight change: Filed Weights   10/29/23 0500 10/30/23 0500 10/31/23 0400  Weight: 111.3 kg 112.2 kg 110.1 kg    Intake/Output:   Intake/Output Summary (Last 24 hours) at 10/31/2023 0724 Last data filed at 10/31/2023 0200 Gross per 24 hour  Intake 742.99 ml  Output 1500 ml  Net -757.01 ml     CVP 9 Physical Exam    General:  elderly appearing.  No respiratory difficulty Neck: supple. JVD ~12 cm. LIJ CVC Cor: PMI nondisplaced. Regular rate & irregular rhythm. No rubs, gallops or murmurs. Lungs: clear, diminished bases Extremities: no cyanosis, clubbing, rash, +1 BLE edema  Neuro: alert & oriented x 3. Moves all 4 extremities w/o difficulty. Affect pleasant.   Telemetry   A fib 90s-100s (Personally reviewed)    EKG    No new EKG to review  Labs    CBC Recent Labs    10/30/23 0244 10/31/23 0354  WBC 11.6* 10.1  HGB 14.3 13.4  HCT 42.9 40.3  MCV 87.4 87.8  PLT 234 171   Basic Metabolic Panel Recent Labs    93/74/74 0244 10/30/23 0957 10/31/23 0354  NA 130* 134* 136  K 4.3 4.8 3.6  CL 100 102 102  CO2 21* 19* 27  GLUCOSE 192* 190* 137*  BUN 39* 45* 39*  CREATININE 1.49* 1.63* 1.28*  CALCIUM  8.3* 8.4* 7.6*  MG  2.9*  --  2.3  PHOS 5.9*  --  4.3   Liver Function Tests Recent Labs    10/30/23 1227 10/31/23 0354  AST 55* 41  ALT 65* 51*  ALKPHOS 152* 120  BILITOT 1.0 1.2  PROT 6.6 5.7*  ALBUMIN 3.4* 2.8*   No results for input(s): LIPASE, AMYLASE in the last 72 hours. Cardiac Enzymes No results for input(s): CKTOTAL, CKMB, CKMBINDEX, TROPONINI in the last 72 hours.  BNP: BNP (last 3 results) Recent Labs    10/28/23 1649  BNP 987.7*    ProBNP (last 3 results) No results for input(s): PROBNP in the last 8760 hours.   D-Dimer No results for input(s): DDIMER in the last 72 hours. Hemoglobin A1C Recent Labs    10/29/23 0516  HGBA1C 12.8*   Fasting Lipid Panel No results for input(s): CHOL, HDL, LDLCALC, TRIG, CHOLHDL, LDLDIRECT in the last 72 hours. Thyroid Function Tests Recent Labs    10/28/23 1649  TSH 1.812    Other results:   Imaging    US  Abdomen Limited RUQ (LIVER/GB) Result Date: 10/30/2023 CLINICAL DATA:  Elevated LFTs. EXAM: ULTRASOUND ABDOMEN LIMITED RIGHT UPPER QUADRANT COMPARISON:  None available FINDINGS: Gallbladder: No gallstones or wall thickening visualized. No sonographic Murphy sign noted by sonographer. Common  bile duct: Diameter: 5 mm Liver: There is diffuse increased liver echogenicity most commonly seen in the setting of fatty infiltration. Superimposed inflammation or fibrosis is not excluded. Clinical correlation is recommended. Portal vein is patent on color Doppler imaging with normal direction of blood flow towards the liver. Other: None. IMPRESSION: Fatty liver, otherwise unremarkable right upper quadrant ultrasound. Electronically Signed   By: Vanetta Chou M.D.   On: 10/30/2023 16:18   DG Chest Port 1 View Result Date: 10/30/2023 CLINICAL DATA:  Central line placement. EXAM: PORTABLE CHEST 1 VIEW COMPARISON:  October 29, 2023 FINDINGS: Since the prior study there is been interval placement of a left-sided internal  jugular venous catheter. Its distal tip is seen at the level of the right hilum within the distal portion of the superior vena cava. The cardiac silhouette is mildly enlarged and unchanged in size. Low lung volumes are noted. Stable prominence of the pulmonary vasculature is seen with associated increased interstitial lung markings. Mild to moderate severity areas of atelectasis and/or infiltrate are present within the bilateral lung bases. No pleural effusion or pneumothorax is identified. Multilevel degenerative changes are seen throughout the thoracic spine. IMPRESSION: 1. Interval left-sided internal jugular venous catheter placement and positioning, as described above, without evidence of pneumothorax. 2. Stable cardiomegaly with mild to moderate severity pulmonary vascular congestion and pulmonary edema. 3. Mild to moderate severity bibasilar atelectasis and/or infiltrate. Electronically Signed   By: Suzen Dials M.D.   On: 10/30/2023 12:30     Medications:     Scheduled Medications:  acetaminophen   500 mg Oral TID   apixaban   5 mg Oral BID   Chlorhexidine  Gluconate Cloth  6 each Topical Daily   fenofibrate   160 mg Oral Daily   insulin  aspart  0-5 Units Subcutaneous QHS   insulin  aspart  0-9 Units Subcutaneous TID WC   insulin  glargine-yfgn  30 Units Subcutaneous Daily   mupirocin  ointment  1 Application Nasal BID   rosuvastatin   5 mg Oral Daily   venlafaxine XR  75 mg Oral Q breakfast   Vitamin D (Ergocalciferol)  50,000 Units Oral Q7 days    Infusions:  sodium chloride      DOBUTamine 5 mcg/kg/min (10/31/23 0000)   norepinephrine (LEVOPHED) Adult infusion Stopped (10/30/23 1613)    PRN Medications: albuterol , docusate sodium , polyethylene glycol    Patient Profile   78 y.o. 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 Presenting with atrial fibrillation and cardiogenic shock.   Assessment/Plan    1. Acute on Chronic Systolic Heart Failure>>Cardiogenic Shock - EF <20% since 2022.  - Etiology uncertain. NICM by cath. ?Tachy-mediated, however now with permanent AF. ?HTN CM in the setting of noncompliance.  - POCUS echo by CCM with EF ~10% - Remains volume overloaded on exam. CVP lower than JVP. Will give another 160 IV lasix this morning and follow response.  - Stable off NE.  - DBA at 2.5 this morning, titrated down overnight for unknown reasons. Changed to 5 mcg/kg/min, discussed with RN - Plan to start spiro once we start to wean DBA - formal echo read pending - Likely end-stage HF in the setting of noncompliance. Given advanced age and noncompliance would not be an advanced therapies candidate. Palliative Care consult would be appropriate for GOC discussion.    2. Permanent Atrial Fibrillation - presented with RVR in 130s - noncompliant with eliquis  at home - Holding BB for  now - rate controlled  - stable off IV amio - continue eliquis  5 mg bid   3. AKI  - pre-renal: in the setting of hypoperfusion - Cr 0.9-1.3 at baseline; up to 1.63 6/25 - 1.28 today, improving with diuresis and inotrope support - plan as above   4. Transaminitis - in the setting of CGS and hepatic congestion - down trending  Encouraged working with PT/OT today.   CRITICAL CARE Performed by: Beckey LITTIE Coe   Total critical care time: 14 minutes  Critical care time was exclusive of separately billable procedures and treating other patients.  Critical care was necessary to treat or prevent imminent or life-threatening deterioration.  Critical care was time spent personally by me on the following activities: development of treatment plan with patient and/or surrogate as well as nursing, discussions with consultants, evaluation of patient's response to treatment, examination of patient, obtaining history from patient or surrogate, ordering and performing treatments and interventions, ordering and  review of laboratory studies, ordering and review of radiographic studies, pulse oximetry and re-evaluation of patient's condition.   Length of Stay: 2  Beckey LITTIE Coe, NP  10/31/2023, 7:24 AM  Advanced Heart Failure Team Pager [phone] (M-F; 7a - 5p)  Please contact [facility] for night-coverage after hours (5p -7a ) and weekends on [email or web address]  Agree with above.   Currently speaking with Palliative Care NP.   Remains on DBA 5. Feeling better more alert. Denies CP or SOB   Scr improving  General:  Elderly male sitting up in bed . No resp difficulty HEENT: normal Neck: supple. JVP to jaw appreciated. Cor: irreg tachy + s3 Lungs: clear Abdomen: soft, nontender, nondistended. No hepatosplenomegaly. No bruits or masses. Good bowel sounds. Extremities: no cyanosis, clubbing, rash, 2+ edema Neuro: alert & orientedx3, cranial nerves grossly intact. moves all 4 extremities w/o difficulty. Affect pleasant  Echo reviewed and he has severe biventricular dysfunction so not VAD candidate.   Suspect he is truly end-stage.   Would continue DBA and IV lasix for now.   Per Palliative team leaning toward home with Hospice. Will need to discuss if he is interested in considering palliative dobutamine.  CRITICAL CARE Performed by: Cherrie Sieving  Total critical care time: 41 minutes  Critical care time was exclusive of separately billable procedures and treating other patients.  Critical care was necessary to treat or prevent imminent or life-threatening deterioration.  Critical care was time spent personally by me (independent of midlevel providers or residents) on the following activities: development of treatment plan with patient and/or surrogate as well as nursing, discussions with consultants, evaluation of patient's response to treatment, examination of patient, obtaining history from patient or surrogate, ordering and performing treatments and interventions, ordering and review of  laboratory studies, ordering and review of radiographic studies, pulse oximetry and re-evaluation of patient's condition.   Sieving Cherrie, MD  6:43 PM

## 2023-10-31 NOTE — Progress Notes (Signed)
 Cards NP titrated Dobutamine at bedside, this  RN made aware. Per NP, keep at a set rate. Orders modified to reflect this.

## 2023-10-31 NOTE — Consult Note (Addendum)
 Consultation Note Date: 10/31/2023   Patient Name: Paul Goodman  DOB: Jan 04, 1946  MRN: [id number]  Age / Sex: 78 y.o., male  PCP: Valora Agent, MD Referring Physician: Von Bellis, MD  Reason for Consultation: Establishing goals of care  HPI/Patient Profile: Per H&P Paul Goodman is a 78 y.o. male with medical history significant of HTN HLD, DM, COPD, sCHF with EF < 20%, stroke, CKD 32, BPH, A-fib on Eliquis , OSA on CPAP, obesity, depression with anxiety, who presents with abnormal lab with hyperglycemia and hyperkalemia.  Clinical Assessment and Goals of Care: Notes and labs reviewed.  In to see patient.  He is currently resting in bed at this time, no family at bedside.  [place].  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  He discusses his spirituality, Sherlean faith and a near death experience that he had when he was younger.  We discussed his diagnosis, prognosis, GOC, EOL wishes disposition and options.  Created space and opportunity for patient  to explore thoughts and feelings regarding current medical information.   A detailed discussion was had today regarding advanced directives.  Concepts specific to code status, artifical feeding and hydration, IV antibiotics and rehospitalization were discussed.  The difference between an aggressive medical intervention path and a comfort care path was discussed.  Values and goals of care important to patient and family were attempted to be elicited.  Discussed limitations of medical interventions to prolong quality of life in some situations and discussed the concept of human mortality.  Heart failure team in to bedside during meeting.  He briefly discusses patient's status.  Patient states he does not want to be a burden on his wife and would not want to live with an unacceptable  quality of life.  He states he and his wife both have a DNR at baseline.  He states he would like to go home with hospice and focus on comfort but would like to discuss this with his wife.  Wife entered room.  Recapped conversation.  Wife is in support of plan of patient discharging home with hospice.  They would like for patient to be medically optimized prior to discharge with hospice.  He would like DNR and DNI status with no escalation in care at this time.  I completed a MOST form today with patient while wife was present at bedside and the signed original was placed in the chart. Each section of options on the form were reviewed in full detail and any questions were answered as needed. The form was scanned and sent to medical records for it to be uploaded under ACP tab in Epic. A photocopy was also placed in the chart to be scanned into EMR. The patient outlined their wishes for the following treatment decisions:  Cardiopulmonary Resuscitation: Do Not Attempt Resuscitation (DNR/No CPR)  Medical Interventions: Comfort Measures: Keep clean, warm, and dry. Use medication by any route, positioning, wound care, and  other measures to relieve pain and suffering. Use oxygen, suction and manual treatment of airway obstruction as needed for comfort. Do not transfer to the hospital unless comfort needs cannot be met in current location.  Antibiotics: No antibiotics (use other measures to relieve symptoms)  IV Fluids: No IV fluids (provide other measures to ensure comfort)  Feeding Tube: No feeding tube       SUMMARY OF RECOMMENDATIONS   Patient and wife would like home with hospice once medically optimized for discharge. Shifted to DNR and DNI today.  Prognosis:  Poor     Primary Diagnoses: Present on Admission:  Atrial fibrillation with RVR (HCC)  COPD (chronic obstructive pulmonary disease) (HCC)  Hypertension  Type II diabetes mellitus with renal manifestations (HCC)  Chronic systolic CHF  (congestive heart failure) (HCC)  Hyperlipidemia  Depression with anxiety  Acute renal failure superimposed on stage 2 chronic kidney disease (HCC)  Obesity (BMI 30-39.9)  Stroke [facility])   I have reviewed the medical record, interviewed the patient and family, and examined the patient. The following aspects are pertinent.  Past Medical History:  Diagnosis Date   Anxiety    Arthritis    CHF (congestive heart failure) (HCC)    COPD (chronic obstructive pulmonary disease) (HCC)    Diabetes (HCC)    ED (erectile dysfunction)    Heart murmur    Hyperlipidemia    Hypertension    Melanoma (HCC)    Sleep apnea    Social History   Socioeconomic History   Marital status: Married    Spouse name: Not on file   Number of children: Not on file   Years of education: Not on file   Highest education level: Not on file  Occupational History   Not on file  Tobacco Use   Smoking status: Former    Types: Cigarettes   Smokeless tobacco: Former    Types: Chew   Tobacco comments:    quit 1992  Vaping Use   Vaping status: Never Used  Substance and Sexual Activity   Alcohol use: Not Currently    Alcohol/week: 14.0 standard drinks of alcohol    Types: 14 Standard drinks or equivalent per week   Drug use: Not Currently    Types: Marijuana   Sexual activity: Not Currently  Other Topics Concern   Not on file  Social History Narrative   Not on file   Social Drivers of Health   Financial Resource Strain: Medium Risk (09/17/2023)   Received from [facility]   Overall Financial Resource Strain (CARDIA)    Difficulty of Paying Living Expenses: Somewhat hard  Food Insecurity: Food Insecurity Present (10/28/2023)   Hunger Vital Sign    Worried About Running Out of Food in the Last Year: Sometimes true    Ran Out of Food in the Last Year: Sometimes true  Transportation Needs: No Transportation Needs (10/28/2023)   PRAPARE - Administrator, Civil Service (Medical):  No    Lack of Transportation (Non-Medical): No  Physical Activity: Not on file  Stress: Not on file  Social Connections: Socially Integrated (10/28/2023)   Social Connection and Isolation Panel    Frequency of Communication with Friends and Family: Three times a week    Frequency of Social Gatherings with Friends and Family: Three times a week    Attends Religious Services: 1 to 4 times per year    Active Member of Clubs or Organizations: Yes    Attends [employer]  or Organization Meetings: 1 to 4 times per year    Marital Status: Married   Family History  Problem Relation Age of Onset   Kidney Stones Father    Kidney disease Neg Hx    Prostate cancer Neg Hx    Kidney cancer Neg Hx    Bladder Cancer Neg Hx    Scheduled Meds:  acetaminophen   500 mg Oral TID   apixaban   5 mg Oral BID   [START ON 11/01/2023] bisacodyl  10 mg Oral QHS   Chlorhexidine  Gluconate Cloth  6 each Topical Daily   fenofibrate   160 mg Oral Daily   insulin  aspart  0-5 Units Subcutaneous QHS   insulin  aspart  0-9 Units Subcutaneous TID WC   insulin  glargine-yfgn  30 Units Subcutaneous Daily   mupirocin  ointment  1 Application Nasal BID   polyethylene glycol  17 g Oral BID   potassium chloride   40 mEq Oral Once   rosuvastatin   5 mg Oral Daily   venlafaxine XR  75 mg Oral Q breakfast   Vitamin D (Ergocalciferol)  50,000 Units Oral Q7 days   Continuous Infusions:  DOBUTamine 5 mcg/kg/min (10/31/23 0831)   norepinephrine (LEVOPHED) Adult infusion Stopped (10/30/23 1613)   PRN Meds:.albuterol , bisacodyl, butalbital -acetaminophen -caffeine , ondansetron  (ZOFRAN ) IV Medications Prior to Admission:  Prior to Admission medications   Medication Sig Start Date End Date Taking? Authorizing Provider  rOPINIRole  (REQUIP  XL) 4 MG 24 hr tablet Take 12-15 mg by mouth at bedtime. 04/11/23 04/10/24 Yes [provider]  albuterol  (PROVENTIL  HFA;VENTOLIN  HFA) 108 (90 BASE) MCG/ACT inhaler Inhale 2 puffs into the lungs every 6  (six) hours as needed for shortness of breath. 06/07/14   [provider]  amiodarone  (PACERONE ) 200 MG tablet Take 1 tablet (200 mg total) by mouth 2 (two) times daily. Patient not taking: Reported on 10/29/2023 06/06/20   Patel, Sona, MD  amLODipine (NORVASC) 10 MG tablet Take 10 mg by mouth daily. Patient not taking: Reported on 10/29/2023 03/26/23   [provider]  apixaban  (ELIQUIS ) 5 MG TABS tablet Take 1 tablet (5 mg total) by mouth 2 (two) times daily. Patient not taking: Reported on 10/29/2023 06/06/20   Patel, Sona, MD  carvedilol  (COREG ) 25 MG tablet Take 1 tablet (25 mg total) by mouth 2 (two) times daily with a meal. Patient not taking: Reported on 10/29/2023 06/06/20   Patel, Sona, MD  Chromium Picolinate (CHROMIUM PICOLATE PO) Take by mouth. Patient not taking: Reported on 10/29/2023    [provider]  dextromethorphan -guaiFENesin  (MUCINEX  DM) 30-600 MG 12hr tablet Take 1 tablet by mouth every evening.    [provider]  fenofibrate  160 MG tablet Take 160 mg by mouth daily. Patient not taking: Reported on 10/29/2023 06/16/14   [provider]  furosemide (LASIX) 20 MG tablet Take 20 mg by mouth daily as needed. Patient not taking: Reported on 10/29/2023 12/15/18   [provider]  hydrochlorothiazide  (HYDRODIURIL ) 25 MG tablet Take 25 mg by mouth daily. Patient not taking: Reported on 10/29/2023 06/21/22   [provider]  Insulin  Glargine (BASAGLAR  KWIKPEN) 100 UNIT/ML Inject 60 Units into the skin daily. Patient not taking: Reported on 10/29/2023 06/16/22   [provider]  liraglutide (VICTOZA) 18 MG/3ML SOPN Inject 1.2 mg into the skin once a week. Patient not taking: Reported on 10/29/2023    [provider]  LORazepam  (ATIVAN ) 1 MG tablet Take 0.5-1 mg by mouth daily as needed. TAKE ONE-HALF (1/2) TO ONE (1)  TABLET BY MOUTH DAILY AS NEEDED FOR ANXIETY, PANIC, OR THROAT SWELLING Patient not taking: Reported on  04/29/2023 05/30/20   [provider]  losartan  (COZAAR ) 100 MG tablet Take 100 mg by mouth daily. Patient not taking: Reported on 10/29/2023 04/18/22   [provider]  PARoxetine  (PAXIL ) 20 MG tablet Take 20 mg by mouth daily. Patient not taking: Reported on 10/29/2023 12/07/13   [provider]  pramipexole (MIRAPEX) 0.125 MG tablet TAKE 1 TABLET BY MOUTH 2 HOURS BEFORE BEDTIME, MAY TITRATE BY 1 TABLET PER WEEK AS NEEDED MAXIMUM 4 TABLETS. Patient not taking: Reported on 04/29/2023 07/31/22   [provider]  rosuvastatin  (CRESTOR ) 5 MG tablet Take 5 mg by mouth daily. Patient not taking: Reported on 10/29/2023 07/27/22   [provider]  spironolactone  (ALDACTONE ) 25 MG tablet Take 0.5 tablets (12.5 mg total) by mouth daily. Patient not taking: Reported on 10/29/2023 06/07/20   Patel, Sona, MD  VANADIUM PO Take by mouth.    [provider]  Zinc  Sulfate (ZINC  15 PO) Take by mouth. Patient not taking: Reported on 10/29/2023    [provider]   No Known Allergies Review of Systems  All other systems reviewed and are negative.   Physical Exam Pulmonary:     Effort: Pulmonary effort is normal.   Skin:    General: Skin is warm and dry.   Neurological:     Mental Status: He is alert.     Vital Signs: BP 129/72   Pulse (!) 49   Temp 97.7 F (36.5 C) (Oral)   Resp 15   Ht 6' 2 (1.88 m)   Wt 110.1 kg   SpO2 94%   BMI 31.16 kg/m  Pain Scale: 0-10 POSS *See Group Information*: S-Acceptable,Sleep, easy to arouse Pain Score: 0-No pain   SpO2: SpO2: 94 % O2 Device:SpO2: 94 % O2 Flow Rate: .O2 Flow Rate (L/min): 2 L/min  IO: Intake/output summary:  Intake/Output Summary (Last 24 hours) at 10/31/2023 1617 Last data filed at 10/31/2023 0831 Gross per 24 hour  Intake 114.84 ml  Output 1200 ml  Net -1085.16 ml    LBM: Last BM Date : 10/28/23 Baseline Weight: Weight: 106.6 kg Most recent weight: Weight: 110.1 kg        Signed by: Camelia Lewis, NP   Please contact Palliative Medicine Team phone at [phone] for questions and concerns.  For individual provider: See Tracey

## 2023-11-01 ENCOUNTER — Other Ambulatory Visit (HOSPITAL_COMMUNITY): Payer: Self-pay

## 2023-11-01 ENCOUNTER — Telehealth (HOSPITAL_COMMUNITY): Payer: Self-pay | Admitting: Pharmacy Technician

## 2023-11-01 DIAGNOSIS — I5023 Acute on chronic systolic (congestive) heart failure: Secondary | ICD-10-CM | POA: Diagnosis not present

## 2023-11-01 DIAGNOSIS — I4891 Unspecified atrial fibrillation: Secondary | ICD-10-CM | POA: Diagnosis not present

## 2023-11-01 LAB — BASIC METABOLIC PANEL WITH GFR
Anion gap: 6 (ref 5–15)
BUN: 31 mg/dL — ABNORMAL HIGH (ref 8–23)
CO2: 30 mmol/L (ref 22–32)
Calcium: 7.9 mg/dL — ABNORMAL LOW (ref 8.9–10.3)
Chloride: 101 mmol/L (ref 98–111)
Creatinine, Ser: 0.98 mg/dL (ref 0.61–1.24)
GFR, Estimated: >60 mL/min (ref >60)
Glucose, Bld: 133 mg/dL — ABNORMAL HIGH (ref 70–99)
Potassium: 3.7 mmol/L (ref 3.5–5.1)
Sodium: 137 mmol/L (ref 135–145)

## 2023-11-01 LAB — COOXEMETRY PANEL
Carboxyhemoglobin: 2.8 % — ABNORMAL HIGH (ref 0.5–1.5)
Carboxyhemoglobin: 1.8 % — ABNORMAL HIGH (ref 0.5–1.5)
Methemoglobin: 1.1 % (ref 0.0–1.5)
Methemoglobin: 1.2 % (ref 0.0–1.5)
O2 Saturation: 88.2 %
O2 Saturation: 71 %
Total hemoglobin: 13.5 g/dL (ref 12.0–16.0)
Total hemoglobin: 14.1 g/dL (ref 12.0–16.0)
Total oxygen content: 84.7 %
Total oxygen content: 68.9 %

## 2023-11-01 LAB — CBC
HCT: 40.1 % (ref 39.0–52.0)
Hemoglobin: 13.2 g/dL (ref 13.0–17.0)
MCH: 28.9 pg (ref 26.0–34.0)
MCHC: 32.9 g/dL (ref 30.0–36.0)
MCV: 87.9 fL (ref 80.0–100.0)
Platelets: 181 10*3/uL (ref 150–400)
RBC: 4.56 MIL/uL (ref 4.22–5.81)
RDW: 12.9 % (ref 11.5–15.5)
WBC: 8.7 10*3/uL (ref 4.0–10.5)
nRBC: 0 % (ref 0.0–0.2)

## 2023-11-01 LAB — MAGNESIUM: Magnesium: 2.3 mg/dL (ref 1.7–2.4)

## 2023-11-01 LAB — GLUCOSE, CAPILLARY
Glucose-Capillary: 172 mg/dL — ABNORMAL HIGH (ref 70–99)
Glucose-Capillary: 196 mg/dL — ABNORMAL HIGH (ref 70–99)
Glucose-Capillary: 205 mg/dL — ABNORMAL HIGH (ref 70–99)
Glucose-Capillary: 213 mg/dL — ABNORMAL HIGH (ref 70–99)

## 2023-11-01 LAB — PHOSPHORUS: Phosphorus: 2.5 mg/dL (ref 2.5–4.6)

## 2023-11-01 MED ORDER — POTASSIUM CHLORIDE CRYS ER 20 MEQ PO TBCR
40.0000 meq | EXTENDED_RELEASE_TABLET | Freq: Once | ORAL | Status: AC
  Administered 2023-11-01: 40 meq via ORAL
  Filled 2023-11-01: qty 2

## 2023-11-01 MED ORDER — ROPINIROLE HCL ER 4 MG PO TB24
4.0000 mg | ORAL_TABLET | Freq: Every day | ORAL | Status: DC
  Administered 2023-11-01 – 2023-11-04 (×4): 4 mg via ORAL
  Filled 2023-11-01 (×4): qty 1

## 2023-11-01 MED ORDER — FUROSEMIDE 10 MG/ML IJ SOLN
160.0000 mg | Freq: Two times a day (BID) | INTRAVENOUS | Status: DC
  Administered 2023-11-01 – 2023-11-03 (×5): 160 mg via INTRAVENOUS
  Filled 2023-11-01 (×2): qty 16
  Filled 2023-11-01: qty 10
  Filled 2023-11-01 (×3): qty 16

## 2023-11-01 MED ORDER — AMIODARONE HCL IN DEXTROSE 360-4.14 MG/200ML-% IV SOLN
30.0000 mg/h | INTRAVENOUS | Status: DC
  Administered 2023-11-02 (×3): 30 mg/h via INTRAVENOUS
  Filled 2023-11-01 (×2): qty 200

## 2023-11-01 MED ORDER — DOCUSATE SODIUM 100 MG PO CAPS
100.0000 mg | ORAL_CAPSULE | Freq: Two times a day (BID) | ORAL | Status: DC
  Administered 2023-11-01 – 2023-11-03 (×4): 100 mg via ORAL
  Filled 2023-11-01 (×6): qty 1

## 2023-11-01 MED ORDER — AMIODARONE LOAD VIA INFUSION
150.0000 mg | Freq: Once | INTRAVENOUS | Status: AC
  Administered 2023-11-01: 150 mg via INTRAVENOUS
  Filled 2023-11-01: qty 83.34

## 2023-11-01 MED ORDER — AMIODARONE HCL IN DEXTROSE 360-4.14 MG/200ML-% IV SOLN
60.0000 mg/h | INTRAVENOUS | Status: AC
  Administered 2023-11-01 (×2): 60 mg/h via INTRAVENOUS
  Filled 2023-11-01 (×2): qty 200

## 2023-11-01 NOTE — Progress Notes (Signed)
 MD and heart failure Provider have been informed: Since the pt received lasix  this evening 1445 pm his heart rate has started to go up. Currently at rest it maintains about 110 to 136 beats for minute. The patient does report feeling his heart beating faster. It usually sustains in the 120's for the most part.  Amiodarone  bolus and Amiodarone  drip have been order by Hospitalist.  12 LED EKG gas been obtained, printed and uploaded to EPIC. Hospitalist has been notified.

## 2023-11-01 NOTE — Progress Notes (Signed)
 PHARMACY CONSULT NOTE - FOLLOW UP  Pharmacy Consult for Electrolyte Monitoring and Replacement   Recent Labs: Potassium (mmol/L)  Date Value  11/01/2023 3.7  04/17/2012 3.8   Magnesium  (mg/dL)  Date Value  93/72/7974 2.3   Calcium  (mg/dL)  Date Value  93/72/7974 7.9 (L)   Calcium , Total (mg/dL)  Date Value  87/87/7986 9.0   Albumin (g/dL)  Date Value  93/73/7974 2.8 (L)  04/17/2012 4.1   Phosphorus (mg/dL)  Date Value  93/72/7974 2.5   Sodium (mmol/L)  Date Value  11/01/2023 137  04/17/2012 139     Assessment: 78 y.o. 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 Upon admission EKG revealed atrial fibrillation RVR with rates in the 140s. Pharmacy is asked to follow and replace electrolytes while in CCU  Diuretics: furosemide  160 mg IV BID  Goal of Therapy:  Potassium 4.0 - 5.1 mmol/L Magnesium  2.0 - 2.4 mg/dL All Other Electrolytes WNL  Plan:  ---40 mEq po KCl x 2 ---recheck electrolytes in am  Adriana JONETTA Bolster ,PharmD Clinical Pharmacist 11/01/2023 8:15 AM

## 2023-11-01 NOTE — Plan of Care (Signed)

## 2023-11-01 NOTE — Telephone Encounter (Signed)
 Patient Product/process development scientist completed.    The patient is insured through [employer]. Patient has [employer], may use a copay card, and/or apply for patient assistance if available.    Ran test claim for Basaglar  Pen and the current 30 day co-pay is $35.00.   This test claim was processed through [facility]- copay amounts may vary at other pharmacies due to pharmacy/plan contracts, or as the patient moves through the different stages of their insurance plan.     Reyes Sharps, CPHT Pharmacy Technician III Certified Patient Advocate [facility] Patient Advocate Team Direct Number: [phone]  Fax: [phone]

## 2023-11-01 NOTE — Inpatient Diabetes Management (Addendum)
 Inpatient Diabetes Program Recommendations  AACE/ADA: New Consensus Statement on Inpatient Glycemic Control (2015)  Target Ranges:  Prepandial:   less than 140 mg/dL      Peak postprandial:   less than 180 mg/dL (1-2 hours)      Critically ill patients:  140 - 180 mg/dL    Latest Reference Range & Units 10/29/23 05:16  Hemoglobin A1C 4.8 - 5.6 % 12.8 (H)  320 mg/dl  (H): Data is abnormally high  Latest Reference Range & Units 10/31/23 08:26 10/31/23 13:53 10/31/23 16:52 10/31/23 21:15  Glucose-Capillary 70 - 99 mg/dL 866 (H)   30 units Semglee  @1129  198 (H)  2 units Novolog  @1502  258 (H)  5 units Novolog   227 (H)  2 units Novolog    (H): Data is abnormally high  Latest Reference Range & Units 11/01/23 07:37  Glucose-Capillary 70 - 99 mg/dL 827 (H)  (H): Data is abnormally high     Home DM Meds: Basaglar  60 units daily                              Victoza 1.2 mg Qweek??   Current Orders: Novolog  Sensitive Correction Scale/ SSI (0-9 units) TID AC + HS                            Semglee  30 units daily     MD- Note A1c significantly elevated. Needs to start back on his meds at home. I do not see anything in the records about him taking Victoza??   ENDO notes form April 2024 had pt on Lantus  + Farxiga  + Ozempic (weekly).   Note afternoon CBGs were elevated yest  May consider Starting Novolog  Meal Coverage:  Novolog  3 units TID with meals HOLD if pt NPO HOLD if pt eats <50% meals  Recommend restart Home Basaglar  at lower dose when pt goes home I don't think we need perfect CBG control at home given pt would like to pursue Hospice, however, we want to prevent consistent CBGs >300    Addendum 11am--Met w/ pt at bedside.  We talked about his transition to Hospice care and how I want to keep his CBGs below 300 at home to prevent bad side effects of Hyperglycemia.  Pt in agreement to take one injection of basal insulin  at home.  He stated he knows he has limited time  left but doesn't want to feel bad.  Discussed with pt that our goal should likely be to keep his CBGs <300 and to keep him out of the hospital for Hyperglycemia.  Pt in agreement and stated he doesn't want to come back to the hospital.  I spoke with Dr. Tobie on the unit this AM.  Relayed to Dr. Tobie that pt was not taking any diabetes meds 3 mos PTA but that pt is willing to restart one injection basal insulin  at home to keep CBGs <300 and to prevent readmission for Hyperglycemia.  I also relayed to Dr. Tobie that I think our goal should not be perfect CBG control given pt going home with Hospice, but to keep him from having hyperglycemic symptoms and prevent re-admission.  Dr. Tobie in agreement.     ENDO: Dr. Damian with [facility] Last Seen April 2024 A1c at that visit was 7.7% Was told to take:  Lantus  60 units at HS + Ozempic 0.5 mg Qweek + Farxiga  10 mg daily     --  Will follow patient during hospitalization--  Adina Rudolpho Arrow RN, MSN, CDCES Diabetes Coordinator Inpatient Glycemic Control Team Team Pager: [phone] (8a-5p)

## 2023-11-01 NOTE — TOC Progression Note (Signed)
 Transition of Care [facility]) - Progression Note    Patient Details  Name: Paul Goodman MRN: [id number] Date of Birth: July 24, 1945  [facility] CM/SW Contact  Corbitt Cloke A Davie Claud, RN Phone Number: 11/01/2023, 2:40 PM  Clinical Narrative:    Chart reviewed. Attempted to complete TOC assessment  at bedside today.  Patient deferred assessment to his wife. Mrs. Silber was not at bedside at the time of attempt.  I have attempted to call Mrs. Calabrese to speak with her about discharge planning.  I have left a voicemail for Mrs. Fristoe to return my call.   TOC will continue to follow for discharge planning.    TOC will continue to follow for discharge planning.          Expected Discharge Plan and Services                                               Social Determinants of Health (SDOH) Interventions SDOH Screenings   Food Insecurity: Food Insecurity Present (10/28/2023)  Housing: High Risk (10/28/2023)  Transportation Needs: No Transportation Needs (10/28/2023)  Utilities: Not At Risk (10/28/2023)  Financial Resource Strain: Medium Risk (09/17/2023)   Received from [facility]  Social Connections: Socially Integrated (10/28/2023)  Tobacco Use: Medium Risk (10/28/2023)    Readmission Risk Interventions     No data to display

## 2023-11-01 NOTE — Progress Notes (Signed)
 [facility] CLINIC CARDIOLOGY PROGRESS NOTE       Patient ID: Paul Goodman MRN: [id number] DOB/AGE: 77-27-1947 77 y.o.  Admit date: 10/28/2023 Referring Physician Dr. Von Primary Physician Valora Agent, MD Primary Cardiologist Dr. Marshia Blanch Reason for Consultation AF RVR  HPI: Paul Goodman is a 78 y.o. male  with a past medical history of permanent atrial fibrillation (on Eliquis ), chronic HFrEF (EF < 20%), nonischemic cardiomyopathy, pulmonary hypertension, hyperlipidemia, hypertension, history CVA, diabetes mellitus, OSA (on CPAP), obesity who presented to the ED on 10/28/2023 sent by pulmonology due to extremely elevated blood glucose and hyperkalemia.  Upon admission EKG revealed atrial fibrillation RVR with rates in the 140s. Cardiology was consulted for further evaluation.   Interval History: -Patient seen and examined this afternoon and sitting up in hospital bed. Patient states he feels good and reports SOB is improving.  Patient is talkative and in good spirits. -BP continues to improve and heart rate elevated.  Per telemetry remains in atrial fibrillation with elevated rates 100-1 tens. -UOP 3.5L yesterday with improving renal function.  -Patient remains on room air with stable SpO2.  -Continues to be on dobutamine  infusion. - Advanced heart failure team following.  Pertinent Cardiac History (Most recent) RHC/LHC (04/29/2023)   There is severe left ventricular systolic dysfunction.   LV end diastolic pressure is mildly elevated.   The left ventricular ejection fraction is less than 25% by visual estimate.   Hemodynamic findings consistent with pulmonary hypertension.   No indication for antiplatelet therapy at this time .  Right heart -Wedge mean of 20 -PA mean 24   Left ventriculogram -Depressed left ventricular function globally left ventricular enlargement EF of around 25%   Coronaries  -Normal coronaries left dominant system  -Study consistent  with a nonischemic cardiomyopathy  Review of systems complete and found to be negative unless listed above    Past Medical History:  Diagnosis Date   Anxiety    Arthritis    CHF (congestive heart failure) (HCC)    COPD (chronic obstructive pulmonary disease) (HCC)    Diabetes (HCC)    ED (erectile dysfunction)    Heart murmur    Hyperlipidemia    Hypertension    Melanoma (HCC)    Sleep apnea     Past Surgical History:  Procedure Laterality Date   EYE SURGERY Right    cancer   HAND SURGERY Left 1966   KNEE SURGERY Left 1973   RIGHT/LEFT HEART CATH AND CORONARY ANGIOGRAPHY Bilateral 04/29/2023   Procedure: RIGHT/LEFT HEART CATH AND CORONARY ANGIOGRAPHY;  Surgeon: Florencio Cara BIRCH, MD;  Location: [facility] INVASIVE CV LAB;  Service: Cardiovascular;  Laterality: Bilateral;   TONSILLECTOMY  1964    Medications Prior to Admission  Medication Sig Dispense Refill Last Dose/Taking   rOPINIRole  (REQUIP  XL) 4 MG 24 hr tablet Take 12-15 mg by mouth at bedtime.   Past Week   albuterol  (PROVENTIL  HFA;VENTOLIN  HFA) 108 (90 BASE) MCG/ACT inhaler Inhale 2 puffs into the lungs every 6 (six) hours as needed for shortness of breath.      amiodarone  (PACERONE ) 200 MG tablet Take 1 tablet (200 mg total) by mouth 2 (two) times daily. (Patient not taking: Reported on 10/29/2023) 60 tablet 2 Not Taking   amLODipine (NORVASC) 10 MG tablet Take 10 mg by mouth daily. (Patient not taking: Reported on 10/29/2023)   Not Taking   apixaban  (ELIQUIS ) 5 MG TABS tablet Take 1 tablet (5 mg total) by mouth 2 (two) times daily. (  Patient not taking: Reported on 10/29/2023) 60 tablet 3 Not Taking   carvedilol  (COREG ) 25 MG tablet Take 1 tablet (25 mg total) by mouth 2 (two) times daily with a meal. (Patient not taking: Reported on 10/29/2023) 60 tablet 3 Not Taking   Chromium Picolinate (CHROMIUM PICOLATE PO) Take by mouth. (Patient not taking: Reported on 10/29/2023)   Not Taking   dextromethorphan -guaiFENesin  (MUCINEX  DM)  30-600 MG 12hr tablet Take 1 tablet by mouth every evening.      fenofibrate  160 MG tablet Take 160 mg by mouth daily. (Patient not taking: Reported on 10/29/2023)   Not Taking   furosemide  (LASIX ) 20 MG tablet Take 20 mg by mouth daily as needed. (Patient not taking: Reported on 10/29/2023)   Not Taking   hydrochlorothiazide  (HYDRODIURIL ) 25 MG tablet Take 25 mg by mouth daily. (Patient not taking: Reported on 10/29/2023)   Not Taking   Insulin  Glargine (BASAGLAR  KWIKPEN) 100 UNIT/ML Inject 60 Units into the skin daily. (Patient not taking: Reported on 10/29/2023)   Not Taking   liraglutide (VICTOZA) 18 MG/3ML SOPN Inject 1.2 mg into the skin once a week. (Patient not taking: Reported on 10/29/2023)   Not Taking   LORazepam  (ATIVAN ) 1 MG tablet Take 0.5-1 mg by mouth daily as needed. TAKE ONE-HALF (1/2) TO ONE (1) TABLET BY MOUTH DAILY AS NEEDED FOR ANXIETY, PANIC, OR THROAT SWELLING (Patient not taking: Reported on 04/29/2023)      losartan  (COZAAR ) 100 MG tablet Take 100 mg by mouth daily. (Patient not taking: Reported on 10/29/2023)   Not Taking   PARoxetine  (PAXIL ) 20 MG tablet Take 20 mg by mouth daily. (Patient not taking: Reported on 10/29/2023)   Not Taking   pramipexole (MIRAPEX) 0.125 MG tablet TAKE 1 TABLET BY MOUTH 2 HOURS BEFORE BEDTIME, MAY TITRATE BY 1 TABLET PER WEEK AS NEEDED MAXIMUM 4 TABLETS. (Patient not taking: Reported on 04/29/2023)   Not Taking   rosuvastatin  (CRESTOR ) 5 MG tablet Take 5 mg by mouth daily. (Patient not taking: Reported on 10/29/2023)   Not Taking   spironolactone  (ALDACTONE ) 25 MG tablet Take 0.5 tablets (12.5 mg total) by mouth daily. (Patient not taking: Reported on 10/29/2023) 30 tablet 3 Not Taking   VANADIUM PO Take by mouth.      Zinc  Sulfate (ZINC  15 PO) Take by mouth. (Patient not taking: Reported on 10/29/2023)   Not Taking   Social History   Socioeconomic History   Marital status: Married    Spouse name: Not on file   Number of children: Not on file    Years of education: Not on file   Highest education level: Not on file  Occupational History   Not on file  Tobacco Use   Smoking status: Former    Types: Cigarettes   Smokeless tobacco: Former    Types: Chew   Tobacco comments:    quit 1992  Vaping Use   Vaping status: Never Used  Substance and Sexual Activity   Alcohol use: Not Currently    Alcohol/week: 14.0 standard drinks of alcohol    Types: 14 Standard drinks or equivalent per week   Drug use: Not Currently    Types: Marijuana   Sexual activity: Not Currently  Other Topics Concern   Not on file  Social History Narrative   Not on file   Social Drivers of Health   Financial Resource Strain: Medium Risk (09/17/2023)   Received from [facility]   Overall Financial Resource Strain (  CARDIA)    Difficulty of Paying Living Expenses: Somewhat hard  Food Insecurity: Food Insecurity Present (10/28/2023)   Hunger Vital Sign    Worried About Running Out of Food in the Last Year: Sometimes true    Ran Out of Food in the Last Year: Sometimes true  Transportation Needs: No Transportation Needs (10/28/2023)   PRAPARE - Administrator, Civil Service (Medical): No    Lack of Transportation (Non-Medical): No  Physical Activity: Not on file  Stress: Not on file  Social Connections: Socially Integrated (10/28/2023)   Social Connection and Isolation Panel    Frequency of Communication with Friends and Family: Three times a week    Frequency of Social Gatherings with Friends and Family: Three times a week    Attends Religious Services: 1 to 4 times per year    Active Member of Clubs or Organizations: Yes    Attends Banker Meetings: 1 to 4 times per year    Marital Status: Married  Catering manager Violence: Not At Risk (10/28/2023)   Humiliation, Afraid, Rape, and Kick questionnaire    Fear of Current or Ex-Partner: No    Emotionally Abused: No    Physically Abused: No    Sexually Abused: No     Family History  Problem Relation Age of Onset   Kidney Stones Father    Kidney disease Neg Hx    Prostate cancer Neg Hx    Kidney cancer Neg Hx    Bladder Cancer Neg Hx      Vitals:   11/01/23 0845 11/01/23 0900 11/01/23 0915 11/01/23 0930  BP: 110/80 128/68 (!) 123/96 114/79  Pulse: (!) 102 60 (!) 157 (!) 113  Resp: 16 18 (!) 24 (!) 22  Temp:      TempSrc:      SpO2: 95% 95% 96% 96%  Weight:      Height:        PHYSICAL EXAM General: Chronically ill appearing elderly male, well nourished, in no acute distress. HEENT: Normocephalic and atraumatic. Neck: No JVD.   Lungs: Normal respiratory effort on room air. CTAB Heart: Irregularly irregular, controlled rate. Normal S1 and S2 without gallops or murmurs.  Abdomen: Non-distended appearing.  Msk: Normal strength and tone for age. Extremities: Warm and well perfused.  No cyanosis, clubbing.  LEE Ace-wrapped, trace pedal edema   Labs: Basic Metabolic Panel: Recent Labs    10/31/23 0354 11/01/23 0302  NA 136 137  K 3.6 3.7  CL 102 101  CO2 27 30  GLUCOSE 137* 133*  BUN 39* 31*  CREATININE 1.28* 0.98  CALCIUM  7.6* 7.9*  MG 2.3 2.3  PHOS 4.3 2.5   Liver Function Tests: Recent Labs    10/30/23 1227 10/31/23 0354  AST 55* 41  ALT 65* 51*  ALKPHOS 152* 120  BILITOT 1.0 1.2  PROT 6.6 5.7*  ALBUMIN 3.4* 2.8*   No results for input(s): LIPASE, AMYLASE in the last 72 hours. CBC: Recent Labs    10/31/23 0354 11/01/23 0302  WBC 10.1 8.7  HGB 13.4 13.2  HCT 40.3 40.1  MCV 87.8 87.9  PLT 171 181   Cardiac Enzymes: No results for input(s): CKTOTAL, CKMB, CKMBINDEX, TROPONINIHS in the last 72 hours. BNP: No results for input(s): BNP in the last 72 hours.  D-Dimer: No results for input(s): DDIMER in the last 72 hours. Hemoglobin A1C: No results for input(s): HGBA1C in the last 72 hours.  Fasting Lipid Panel: No results  for input(s): CHOL, HDL, LDLCALC, TRIG, CHOLHDL,  LDLDIRECT in the last 72 hours. Thyroid Function Tests: No results for input(s): TSH, T4TOTAL, T3FREE, THYROIDAB in the last 72 hours.  Invalid input(s): FREET3  Anemia Panel: No results for input(s): VITAMINB12, FOLATE, FERRITIN, TIBC, IRON, RETICCTPCT in the last 72 hours.    Radiology: US  Abdomen Limited RUQ (LIVER/GB) Result Date: 10/30/2023 CLINICAL DATA:  Elevated LFTs. EXAM: ULTRASOUND ABDOMEN LIMITED RIGHT UPPER QUADRANT COMPARISON:  None available FINDINGS: Gallbladder: No gallstones or wall thickening visualized. No sonographic Murphy sign noted by sonographer. Common bile duct: Diameter: 5 mm Liver: There is diffuse increased liver echogenicity most commonly seen in the setting of fatty infiltration. Superimposed inflammation or fibrosis is not excluded. Clinical correlation is recommended. Portal vein is patent on color Doppler imaging with normal direction of blood flow towards the liver. Other: None. IMPRESSION: Fatty liver, otherwise unremarkable right upper quadrant ultrasound. Electronically Signed   By: Vanetta Chou M.D.   On: 10/30/2023 16:18   DG Chest Port 1 View Result Date: 10/30/2023 CLINICAL DATA:  Central line placement. EXAM: PORTABLE CHEST 1 VIEW COMPARISON:  October 29, 2023 FINDINGS: Since the prior study there is been interval placement of a left-sided internal jugular venous catheter. Its distal tip is seen at the level of the right hilum within the distal portion of the superior vena cava. The cardiac silhouette is mildly enlarged and unchanged in size. Low lung volumes are noted. Stable prominence of the pulmonary vasculature is seen with associated increased interstitial lung markings. Mild to moderate severity areas of atelectasis and/or infiltrate are present within the bilateral lung bases. No pleural effusion or pneumothorax is identified. Multilevel degenerative changes are seen throughout the thoracic spine. IMPRESSION: 1. Interval  left-sided internal jugular venous catheter placement and positioning, as described above, without evidence of pneumothorax. 2. Stable cardiomegaly with mild to moderate severity pulmonary vascular congestion and pulmonary edema. 3. Mild to moderate severity bibasilar atelectasis and/or infiltrate. Electronically Signed   By: Suzen Dials M.D.   On: 10/30/2023 12:30   DG Chest Port 1 View Result Date: 10/29/2023 CLINICAL DATA:  Congestive heart failure. EXAM: PORTABLE CHEST 1 VIEW COMPARISON:  February 09, 2023. FINDINGS: Mild cardiomegaly is noted with mild central pulmonary vascular congestion. Bibasilar pulmonary edema is noted. Bony thorax is unremarkable. IMPRESSION: Mild cardiomegaly with mild central pulmonary vascular congestion and bilateral pulmonary edema. Electronically Signed   By: Lynwood Landy Raddle M.D.   On: 10/29/2023 16:30   CT HEAD WO CONTRAST ( ) Result Date: 10/28/2023 CLINICAL DATA:  Recent syncopal episode EXAM: CT HEAD WITHOUT CONTRAST TECHNIQUE: Contiguous axial images were obtained from the base of the skull through the vertex without intravenous contrast. RADIATION DOSE REDUCTION: This exam was performed according to the departmental dose-optimization program which includes automated exposure control, adjustment of the mA and/or kV according to patient size and/or use of iterative reconstruction technique. COMPARISON:  02/09/2023 FINDINGS: Brain: No evidence of acute infarction, hemorrhage, hydrocephalus, extra-axial collection or mass lesion/mass effect. Remote right frontoparietal infarct is again seen and stable. Vascular calcifications are noted in the centrum semi ovale stable from the prior exam. Basal ganglia calcifications are seen as well. Vascular: No hyperdense vessel or unexpected calcification. Skull: Normal. Negative for fracture or focal lesion. Sinuses/Orbits: No acute finding. Other: None. IMPRESSION: Chronic right frontoparietal infarct.  No acute abnormality  noted. Electronically Signed   By: Oneil Devonshire M.D.   On: 10/28/2023 19:47    ECHO pending  TELEMETRY reviewed  by me 11/01/2023: Atrial fibrillation, rate 100-110s  EKG reviewed by me: Atrial fibrillation RVR rate 131 bpm  Data reviewed by me 11/01/2023: last 24h vitals tele labs imaging I/O hospitalist progress notes.  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 male  with a past medical history of permanent atrial fibrillation (on Eliquis ), chronic HFrEF (EF < 20%), nonischemic cardiomyopathy, pulmonary hypertension, hyperlipidemia, hypertension history CVA, diabetes mellitus, OSA (on CPAP), obesity who presented to the ED on 10/28/2023 sent by pulmonology due to extremely elevated blood glucose and hyperkalemia.  Upon admission EKG revealed atrial fibrillation RVR with rates in the 140s. Cardiology was consulted for further evaluation.   # Atrial fibrillation RVR # Permanent atrial fibrillation Patient denies chest pain, palpitations. EKG in ED with atrial fibrillation rate 131 bpm. Patient started on IV amio infusion. Patient takes PO amio 200 mg at home. Per tele in AF with improving HR in 70-80s. -Monitor and replenish electrolytes for a goal K >4, Mag >2  -Holding amio per advanced heart failure team. -Continue Eliquis  5 mg twice daily for stroke risk reduction. -D/C Coreg  as stated below.  # Cardiogenic shock # Acute on chronic  HFrEF # Nonischemic cardiomyopathy # Medication noncompliance Patient with stated noncompliance to medication.  Patient states he does not take his cardiac medications as prescribed at home because he believes the medications do not help him.  Presents with worsening SOB, orthopnea and LEE. CXR with mild cardiomegaly and pulmonary vascular congestion and bilateral pulmonary edema. BNP elevated at 990. Poor UOP yesterday (06/24) s/p IV lasix  80 mg. Patient confused, cold, hypotensive this AM (06/25), transferred to ICU and started on levo and dobutamine  infusion due to low perfusion/low CO, cardiogenic shock. Lactic acid 1.2 (06/25). POCUS echo revealed EF around 10%. On 06/26 weaned off levo gtt around 1600 on 06/25 and continues to be on dobutamine . BP improving. -Echo pending.  Further recommendations pending results. -Advanced heart failure consulted, appreciate recommendations.  -Continue dobutamine  per heart failure team.  -IV lasix  160 mg per heart failure team. Closely monitor renal function and UOP. (Patient prescribed Lasix  20 mg daily at home however states he does not take this.) -Holding Coreg  for now. Plan to resume when hemodynamically stable and closer to euvolemia.  -Holding spiro for now. -Hold off on starting dapagliflozin  due to uncontrolled A1C of 12.8% ([place] $ 38.08) -Plan to resume and optimize GDMT when patient off inotrope and hemodynamically stable.  -There has been brief outpatient discussion about possible defibrillator therapy with Dr. Tobie if patient remains on GDMT and does not show improvement to cardiac function.  # Hypertension # Hyperlipidemia Patient hypotensive, started on dobutamine  and levo infusion.  -Continue rosuvastatin  5 mg daily, fenofibrate  160 mg daily.   This patient's plan of care was discussed and created with Dr. Florencio and he is in agreement.  Signed: Dillinger Aston, PA-C  11/01/2023, 2:11 PM [facility]  Cardiology

## 2023-11-01 NOTE — Progress Notes (Signed)
 [facility]  - [place] [facility]   PATIENT NAME: Paul Goodman    MR#:  [id number]  DATE OF BIRTH:  [birth year]  SUBJECTIVE:  patient's family friend at bedside. Currently on room air. Good urine output. Patient tells me he would like to go home with hospice at discharge. Currently on dobutamine  drip.    VITALS:  Blood pressure (!) 141/93, pulse (!) 53, temperature 98 F (36.7 C), temperature source Oral, resp. rate (!) 27, height 6' 2 (1.88 m), weight 108.3 kg, SpO2 95%.  PHYSICAL EXAMINATION:   GENERAL:  78 y.o.-year-old patient with no acute distress. Appears weak and chronically ill LUNGS: decreased breath sounds bilaterally, no wheezing CARDIOVASCULAR: S1, S2 normal. No murmur   ABDOMEN: Soft, nontender, nondistended. Bowel sounds present.  EXTREMITIES: + edema b/l.    NEUROLOGIC: nonfocal  patient is alert and awake   LABORATORY PANEL:  CBC Recent Labs  Lab 11/01/23 0302  WBC 8.7  HGB 13.2  HCT 40.1  PLT 181    Chemistries  Recent Labs  Lab 10/31/23 0354 11/01/23 0302  NA 136 137  K 3.6 3.7  CL 102 101  CO2 27 30  GLUCOSE 137* 133*  BUN 39* 31*  CREATININE 1.28* 0.98  CALCIUM  7.6* 7.9*  MG 2.3 2.3  AST 41  --   ALT 51*  --   ALKPHOS 120  --   BILITOT 1.2  --     Assessment and Plan  Paul Goodman is a 78 y.o. male with medical history significant of HTN HLD, DM, COPD, sCHF with EF < 20%, stroke, CKD 32, BPH, A-fib on Eliquis , OSA on CPAP, obesity, depression with anxiety, who presents with abnormal lab with hyperglycemia and hyperkalemia.   Acute on chronic systolic congestive heart failure/cardiogenic shock Atrial fibrillation with RVR: HR is 110-140s.  Possibly due to medication noncompliance. -- Continue Eliquis   - TSH level wnl --6/26 Levophed  weaned off, currently on dobutamine  -- advance CHF team cardiology consulted --6/27--increased lasix  to 160 mg bid --EF <20% --cont Dobutamine  gtt over the weekend   #  Acute renal failure superimposed on stage 2 chronic kidney disease:  - s/p Lasix  and spironolactone  --creat 0.98   # Hypertension:  --Patient was on Coreg  25 mg p.o. twice daily.--now d/ced by cardiology    # Type II diabetes mellitus with renal manifestations:  --Recent A1c 7.7, poorly controlled.  Patient is supposed to take Victoza and glargine insulin  60 units daily, but not taking medications currently.  Blood sugar 543 with normal anion gap 11 -- Hemoglobin A1c 12.8 uncontrolled - Start glargine insulin  45 units daily - SSI   # Depression with anxiety -As needed Ativan  --started Effexor  seen by psych   # COPD (chronic obstructive pulmonary disease): No wheezes -Bronchodilators as needed Mucinex    # Hyperlipidemia: on Crestor  and fenofibrate    # Stroke: Crestor  - Patient is on Eliquis  for A-fib   # Vitamin D  insufficiency: started vitamin D  50,000 units p.o. weekly, follow with PCP to repeat vitamin D  level after 3 to 6 months.   # Fall at home, initial encounter: CT -head negative for injury - Continue fall precaution - PT/OT--out pt rehab   # OSA: on CPAP   # Palliative care consulted Long-term prognosis is poor due to extensive cardiac history and depression.  As per palliative care, patient was made DNR/DNI. He wants to go home with hospice when he is medically optimized to do so.    Body  mass index is 31.16 kg/m.  Interventions:  Procedures: Family communication :family friend Consults :[facility] cards CODE STATUS: DNR DVT Prophylaxis : Level of care: ICU Status is: Inpatient Remains inpatient appropriate because: CHF and on Dobutamine  gtt    TOTAL TIME TAKING CARE OF THIS PATIENT: 45 minutes.  >50% time spent on counselling and coordination of care  Note: This dictation was prepared with Dragon dictation along with smaller phrase technology. Any transcriptional errors that result from this process are unintentional.  Leita Blanch M.D    [facility]   CC: Primary care physician; Valora Agent, MD

## 2023-11-01 NOTE — Progress Notes (Signed)
 Advanced Heart Failure Rounding Note  Cardiologist: None  Chief Complaint: Cardiogenic shock Subjective:    Remains on DBA 5. Co-ox 71%. Diuresing well -3.3L.   Feels better. Denies CP or SOB   Objective:   Weight Range: 108.3 kg Body mass index is 30.65 kg/m.   Vital Signs:   Temp:  [97.5 F (36.4 C)-98.6 F (37 C)] 97.5 F (36.4 C) (06/27 0800) Pulse Rate:  [25-157] 113 (06/27 0930) Resp:  [12-33] 22 (06/27 0930) BP: (94-144)/(60-133) 114/79 (06/27 0930) SpO2:  [89 %-99 %] 96 % (06/27 0930) Weight:  [108.3 kg] 108.3 kg (06/27 0500) Last BM Date : 10/28/23  Weight change: Filed Weights   10/30/23 0500 10/31/23 0400 11/01/23 0500  Weight: 112.2 kg 110.1 kg 108.3 kg    Intake/Output:   Intake/Output Summary (Last 24 hours) at 11/01/2023 1139 Last data filed at 11/01/2023 1000 Gross per 24 hour  Intake 446.12 ml  Output 3850 ml  Net -3403.88 ml      Physical Exam    General:  Sitting up in bed No resp difficulty HEENT: normal Neck: supple. JVP to ear  Carotids 2+ bilat; no bruits. No lymphadenopathy or thryomegaly appreciated. Cor: PMI nondisplaced. Irregular rate & rhythm. No rubs, gallops or murmurs. Lungs: clear Abdomen: soft, nontender, nondistended. No hepatosplenomegaly. No bruits or masses. Good bowel sounds. Extremities: no cyanosis, clubbing, rash, 2+  edema Neuro: alert & orientedx3, cranial nerves grossly intact. moves all 4 extremities w/o difficulty. Affect pleasant   Telemetry   A fib 90s-120 (Personally reviewed)    Labs    CBC Recent Labs    10/31/23 0354 11/01/23 0302  WBC 10.1 8.7  HGB 13.4 13.2  HCT 40.3 40.1  MCV 87.8 87.9  PLT 171 181   Basic Metabolic Panel Recent Labs    93/73/74 0354 11/01/23 0302  NA 136 137  K 3.6 3.7  CL 102 101  CO2 27 30  GLUCOSE 137* 133*  BUN 39* 31*  CREATININE 1.28* 0.98  CALCIUM  7.6* 7.9*  MG 2.3 2.3  PHOS 4.3 2.5   Liver Function Tests Recent Labs    10/30/23 1227  10/31/23 0354  AST 55* 41  ALT 65* 51*  ALKPHOS 152* 120  BILITOT 1.0 1.2  PROT 6.6 5.7*  ALBUMIN 3.4* 2.8*   No results for input(s): LIPASE, AMYLASE in the last 72 hours. Cardiac Enzymes No results for input(s): CKTOTAL, CKMB, CKMBINDEX, TROPONINI in the last 72 hours.  BNP: BNP (last 3 results) Recent Labs    10/28/23 1649  BNP 987.7*    ProBNP (last 3 results) No results for input(s): PROBNP in the last 8760 hours.   D-Dimer No results for input(s): DDIMER in the last 72 hours. Hemoglobin A1C No results for input(s): HGBA1C in the last 72 hours.  Fasting Lipid Panel No results for input(s): CHOL, HDL, LDLCALC, TRIG, CHOLHDL, LDLDIRECT in the last 72 hours. Thyroid Function Tests No results for input(s): TSH, T4TOTAL, T3FREE, THYROIDAB in the last 72 hours.  Invalid input(s): FREET3   Other results:   Imaging    No results found.    Medications:     Scheduled Medications:  acetaminophen   500 mg Oral TID   apixaban   5 mg Oral BID   bisacodyl   10 mg Oral QHS   Chlorhexidine  Gluconate Cloth  6 each Topical Daily   docusate sodium   100 mg Oral BID   fenofibrate   160 mg Oral Daily   insulin  aspart  0-5 Units Subcutaneous QHS   insulin  aspart  0-9 Units Subcutaneous TID WC   insulin  glargine-yfgn  30 Units Subcutaneous Daily   mupirocin  ointment  1 Application Nasal BID   polyethylene glycol  17 g Oral BID   rosuvastatin   5 mg Oral Daily   venlafaxine  XR  75 mg Oral Q breakfast   Vitamin D  (Ergocalciferol )  50,000 Units Oral Q7 days    Infusions:  DOBUTamine  5 mcg/kg/min (11/01/23 0959)   norepinephrine  (LEVOPHED ) Adult infusion Stopped (10/30/23 1613)    PRN Medications: albuterol , bisacodyl , butalbital -acetaminophen -caffeine , ondansetron  (ZOFRAN ) IV    Patient Profile   78 y.o. 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  Presenting with atrial fibrillation and cardiogenic shock.   Assessment/Plan   1. Acute on Chronic Systolic Heart Failure>>Cardiogenic Shock - EF <20% since 2022.  - Etiology uncertain. NICM by cath. ?Tachy-mediated, however now with permanent AF. ?HTN CM in the setting of noncompliance.  - POCUS echo by CCM with EF ~10% - Remains on DBA 5. Co-ox 71% Volume status still elevated - Increase lasix  to 160 IV bid  - Echo reviewed and he has severe biventricular dysfunction with LVEF <20%  so not VAD candidate. Suspect he is truly end-stage.  - Per Palliative team leaning toward home with Hospice.  - Would continue DBA support over the weekend until he is full y diuresed. Once fully diuresed will wean DBA. If develops recurrent low output would need to consider home DBA. I discussed this with him today and he says he would have to think about that   2. Permanent Atrial Fibrillation - noncompliant with eliquis  at home - Holding BB for now - rate mildly elevated on DBA - continue eliquis  5 mg bid   3. AKI  - pre-renal: in the setting of hypoperfusion/ATN - Cr 0.9-1.3 at baseline; up to 1.63 6/25 - SCr 0.98 today. Normalized with inotropic support    4. Transaminitis - in the setting of CGS and hepatic congestion - down trending   The AHF team will see again Monday.   CRITICAL CARE Performed by: Toribio Fuel   Total critical care time: 41 minutes  Critical care time was exclusive of separately billable procedures and treating other patients.  Critical care was necessary to treat or prevent imminent or life-threatening deterioration.  Critical care was time spent personally by me on the following activities: development of treatment plan with patient and/or surrogate as well as nursing, discussions with consultants, evaluation of patient's response to treatment, examination of patient, obtaining history from patient or surrogate, ordering and performing treatments and  interventions, ordering and review of laboratory studies, ordering and review of radiographic studies, pulse oximetry and re-evaluation of patient's condition.   Length of Stay: 3  Toribio Fuel, MD  11/01/2023, 11:39 AM

## 2023-11-02 DIAGNOSIS — F0631 Mood disorder due to known physiological condition with depressive features: Secondary | ICD-10-CM | POA: Diagnosis not present

## 2023-11-02 DIAGNOSIS — I4891 Unspecified atrial fibrillation: Secondary | ICD-10-CM | POA: Diagnosis not present

## 2023-11-02 DIAGNOSIS — N179 Acute kidney failure, unspecified: Secondary | ICD-10-CM | POA: Diagnosis not present

## 2023-11-02 DIAGNOSIS — I5022 Chronic systolic (congestive) heart failure: Secondary | ICD-10-CM | POA: Diagnosis not present

## 2023-11-02 LAB — BASIC METABOLIC PANEL WITH GFR
Anion gap: 7 (ref 5–15)
BUN: 24 mg/dL — ABNORMAL HIGH (ref 8–23)
CO2: 28 mmol/L (ref 22–32)
Calcium: 8 mg/dL — ABNORMAL LOW (ref 8.9–10.3)
Chloride: 97 mmol/L — ABNORMAL LOW (ref 98–111)
Creatinine, Ser: 0.89 mg/dL (ref 0.61–1.24)
GFR, Estimated: >60 mL/min (ref >60)
Glucose, Bld: 167 mg/dL — ABNORMAL HIGH (ref 70–99)
Potassium: 3.7 mmol/L (ref 3.5–5.1)
Sodium: 132 mmol/L — ABNORMAL LOW (ref 135–145)

## 2023-11-02 LAB — COOXEMETRY PANEL
Carboxyhemoglobin: 1.5 % (ref 0.5–1.5)
Methemoglobin: 0.9 % (ref 0.0–1.5)
O2 Saturation: 61.2 %
Total hemoglobin: 14 g/dL (ref 12.0–16.0)
Total oxygen content: 59.7 %

## 2023-11-02 LAB — GLUCOSE, CAPILLARY
Glucose-Capillary: 155 mg/dL — ABNORMAL HIGH (ref 70–99)
Glucose-Capillary: 291 mg/dL — ABNORMAL HIGH (ref 70–99)
Glucose-Capillary: 191 mg/dL — ABNORMAL HIGH (ref 70–99)
Glucose-Capillary: 278 mg/dL — ABNORMAL HIGH (ref 70–99)

## 2023-11-02 LAB — MAGNESIUM: Magnesium: 2.1 mg/dL (ref 1.7–2.4)

## 2023-11-02 MED ORDER — SODIUM CHLORIDE 0.9% FLUSH: 10.0000 mL | INTRAVENOUS | Status: DC | PRN

## 2023-11-02 MED ORDER — POTASSIUM CHLORIDE CRYS ER 20 MEQ PO TBCR
40.0000 meq | EXTENDED_RELEASE_TABLET | Freq: Every day | ORAL | Status: DC
  Administered 2023-11-02 – 2023-11-05 (×4): 40 meq via ORAL
  Filled 2023-11-02 (×4): qty 2

## 2023-11-02 MED ORDER — AMIODARONE HCL 200 MG PO TABS
200.0000 mg | ORAL_TABLET | Freq: Two times a day (BID) | ORAL | Status: DC
  Administered 2023-11-02 – 2023-11-05 (×7): 200 mg via ORAL
  Filled 2023-11-02 (×7): qty 1

## 2023-11-02 MED ORDER — SODIUM CHLORIDE 0.9% FLUSH
10.0000 mL | Freq: Two times a day (BID) | INTRAVENOUS | Status: DC
  Administered 2023-11-02 – 2023-11-04 (×6): 10 mL
  Administered 2023-11-04: 30 mL
  Administered 2023-11-05: 10 mL

## 2023-11-02 MED ORDER — ORAL CARE MOUTH RINSE
15.0000 mL | OROMUCOSAL | Status: DC | PRN
Start: 2023-11-02 — End: 2023-11-05

## 2023-11-02 NOTE — Progress Notes (Addendum)
 [facility] Cardiology    SUBJECTIVE: Patient states he feels somewhat better still has some generalized weakness and fatigue shortness of breath is much improved no chest pain still feels weak   Vitals:   11/02/23 0600 11/02/23 0615 11/02/23 0630 11/02/23 0800  BP: 110/70 (!) 111/53 (!) 106/59   Pulse: (!) 108 (!) 109 80   Resp: 18 16 19    Temp:    98 F (36.7 C)  TempSrc:    Oral  SpO2: 93% 95% 98%   Weight:      Height:         Intake/Output Summary (Last 24 hours) at 11/02/2023 1033 Last data filed at 11/02/2023 9047 Gross per 24 hour  Intake 608.55 ml  Output 4450 ml  Net -3841.45 ml      PHYSICAL EXAM  General: Well developed, well nourished, in no acute distress HEENT:  Normocephalic and atramatic Neck:  No JVD.  Lungs: Clear bilaterally to auscultation and percussion. Heart: HRRR . Normal S1 and S2 without gallops or murmurs.  Abdomen: Bowel sounds are positive, abdomen soft and non-tender  Msk:  Back normal, normal gait. Normal strength and tone for age. Extremities: No clubbing, cyanosis or edema.   Neuro: Alert and oriented X 3. Psych:  Good affect, responds appropriately   LABS: Basic Metabolic Panel: Recent Labs    10/31/23 0354 11/01/23 0302 11/02/23 0332  NA 136 137 132*  K 3.6 3.7 3.7  CL 102 101 97*  CO2 27 30 28   GLUCOSE 137* 133* 167*  BUN 39* 31* 24*  CREATININE 1.28* 0.98 0.89  CALCIUM  7.6* 7.9* 8.0*  MG 2.3 2.3 2.1  PHOS 4.3 2.5  --    Liver Function Tests: Recent Labs    10/30/23 1227 10/31/23 0354  AST 55* 41  ALT 65* 51*  ALKPHOS 152* 120  BILITOT 1.0 1.2  PROT 6.6 5.7*  ALBUMIN 3.4* 2.8*   No results for input(s): LIPASE, AMYLASE in the last 72 hours. CBC: Recent Labs    10/31/23 0354 11/01/23 0302  WBC 10.1 8.7  HGB 13.4 13.2  HCT 40.3 40.1  MCV 87.8 87.9  PLT 171 181   Cardiac Enzymes: No results for input(s): CKTOTAL, CKMB, CKMBINDEX, TROPONINI in the last 72 hours. BNP: Invalid input(s):  POCBNP D-Dimer: No results for input(s): DDIMER in the last 72 hours. Hemoglobin A1C: No results for input(s): HGBA1C in the last 72 hours. Fasting Lipid Panel: No results for input(s): CHOL, HDL, LDLCALC, TRIG, CHOLHDL, LDLDIRECT in the last 72 hours. Thyroid Function Tests: No results for input(s): TSH, T4TOTAL, T3FREE, THYROIDAB in the last 72 hours.  Invalid input(s): FREET3 Anemia Panel: No results for input(s): VITAMINB12, FOLATE, FERRITIN, TIBC, IRON, RETICCTPCT in the last 72 hours.  No results found.   Echo severely basilar ventricular function EF around 20%  TELEMETRY: Atrial fibrillation rate of 112 continue current therapy with rate control:  ASSESSMENT AND PLAN:  Principal Problem:   Atrial fibrillation with RVR (HCC) Active Problems:   COPD (chronic obstructive pulmonary disease) (HCC)   Hypertension   OSA on CPAP   Type II diabetes mellitus with renal manifestations (HCC)   Hyperlipidemia   Chronic systolic CHF (congestive heart failure) (HCC)   Depression with anxiety   Acute renal failure superimposed on stage 2 chronic kidney disease (HCC)   Obesity (BMI 30-39.9)   Stroke [facility])   Fall at home, initial encounter   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 load consider adding digoxin if further rate control necessary unable to give beta-blockers or Cardizem  because of hypotension and heart failure Severe cardiomyopathy EF less than 20% appreciate advanced heart failure input continue to wean off of dobutamine  transferred back to telemetry once stable Diabetes type 2 continue insulin  therapy and semaglutide COPD inhalers supplemental  oxygen as necessary agree with pulmonary input Hyperlipidemia continue fenofibrate  Crestor  therapy for lipid management Continue supportive care   Cara JONETTA Lovelace, MD, 11/02/2023 10:33 AM

## 2023-11-02 NOTE — Progress Notes (Signed)
 [facility]  - [place] at [facility]   PATIENT NAME: Paul Goodman    MR#:  [id number]  DATE OF BIRTH:  February 25, 1946  SUBJECTIVE:  No family at bedside. Currently on room air. Good urine output. Patient tells me he would like to go home with hospice at discharge. Currently on dobutamine  drip. Patient started last evening on amiodarone  drip for rapid a fib.UOP 4.5 L   VITALS:  Blood pressure (!) 145/70, pulse (!) 58, temperature 98.3 F (36.8 C), temperature source Oral, resp. rate 20, height 6' 2 (1.88 m), weight 106.9 kg, SpO2 96%.  PHYSICAL EXAMINATION:   GENERAL:  78 y.o.-year-old patient with no acute distress. Appears weak and chronically ill LUNGS: decreased breath sounds bilaterally, no wheezing CARDIOVASCULAR: S1, S2 normal. No murmur   ABDOMEN: Soft, nontender, nondistended. Bowel sounds present.  EXTREMITIES: + edema b/l.    NEUROLOGIC: nonfocal  patient is alert and awake   LABORATORY PANEL:  CBC Recent Labs  Lab 11/01/23 0302  WBC 8.7  HGB 13.2  HCT 40.1  PLT 181    Chemistries  Recent Labs  Lab 10/31/23 0354 11/01/23 0302 11/02/23 0332  NA 136   < > 132*  K 3.6   < > 3.7  CL 102   < > 97*  CO2 27   < > 28  GLUCOSE 137*   < > 167*  BUN 39*   < > 24*  CREATININE 1.28*   < > 0.89  CALCIUM  7.6*   < > 8.0*  MG 2.3   < > 2.1  AST 41  --   --   ALT 51*  --   --   ALKPHOS 120  --   --   BILITOT 1.2  --   --    < > = values in this interval not displayed.    Assessment and Plan  Paul Goodman is a 78 y.o. male with medical history significant of HTN HLD, DM, COPD, sCHF with EF < 20%, stroke, CKD 32, BPH, A-fib on Eliquis , OSA on CPAP, obesity, depression with anxiety, who presents with abnormal lab with hyperglycemia and hyperkalemia.   Acute on chronic systolic congestive heart failure/cardiogenic shock Atrial fibrillation with RVR: HR is 110-140s.  Possibly due to medication noncompliance. -- Continue Eliquis   - TSH level  wnl --6/26 Levophed  weaned off, currently on dobutamine  -- advance CHF team cardiology consulted --6/27--increased lasix  to 160 mg bid --EF <20% --cont Dobutamine  gtt over the weekend --6/28-- patient on amiodarone  drip due to rapid a fib. Heartrate 97--110   # Acute renal failure superimposed on stage 2 chronic kidney disease:  - s/p Lasix  and spironolactone  --creat 0.98   # Hypertension:  --Patient was on Coreg  25 mg p.o. twice daily.--now d/ced by cardiology    # Type II diabetes mellitus with renal manifestations:  --Recent A1c 7.7, poorly controlled.  Patient is supposed to take Victoza and glargine insulin  60 units daily, but not taking medications currently.  Blood sugar 543 with normal anion gap 11 -- Hemoglobin A1c 12.8 uncontrolled - Start glargine insulin  45 units daily - SSI   # Depression with anxiety -As needed Ativan  --started Effexor  seen by psych   # COPD (chronic obstructive pulmonary disease): No wheezes -Bronchodilators as needed Mucinex    # Hyperlipidemia: on Crestor  and fenofibrate    # Stroke: Crestor  - Patient is on Eliquis  for A-fib   # Vitamin D  insufficiency: started vitamin D  50,000 units p.o. weekly, follow with  PCP to repeat vitamin D  level after 3 to 6 months.   # Fall at home, initial encounter: CT -head negative for injury - Continue fall precaution - PT/OT--out pt rehab   # OSA: on CPAP   # Palliative care consulted Long-term prognosis is poor due to extensive cardiac history and depression.  As per palliative care, patient was made DNR/DNI. He wants to go home with hospice when he is medically optimized to do so.    Body mass index is 31.16 kg/m.  Interventions:  Procedures: Family communication :none today Consults :[facility] cards CODE STATUS: DNR DVT Prophylaxis : Level of care: ICU Status is: Inpatient Remains inpatient appropriate because: CHF and on Dobutamine  gtt    TOTAL TIME TAKING CARE OF THIS PATIENT: 45 minutes.   >50% time spent on counselling and coordination of care  Note: This dictation was prepared with Dragon dictation along with smaller phrase technology. Any transcriptional errors that result from this process are unintentional.  Leita Blanch M.D    [facility]   CC: Primary care physician; Valora Agent, MD

## 2023-11-02 NOTE — TOC Progression Note (Signed)
 Transition of Care [facility]) - Progression Note    Patient Details  Name: Paul Goodman MRN: [id number] Date of Birth: 07-04-1945  [facility] CM/SW Contact  [place] L Alaiza Yau, [place] Phone Number: 11/02/2023, 10:02 AM  Clinical Narrative:     CSW spoke with Mrs. Higinbotham to follow-up with Home Hospice recommendation. Mrs. Poser advised that she has not had the opportunity to consider because she does not know much about Home Hospice. CSW offered to connect Mrs. Ganoe with someone who can give her an explanation.  Chat started in [facility] to include Saddie Na and [facility]. Request made for follow-up with spouse.        Expected Discharge Plan and Services                                               Social Determinants of Health (SDOH) Interventions SDOH Screenings   Food Insecurity: Food Insecurity Present (10/28/2023)  Housing: High Risk (10/28/2023)  Transportation Needs: No Transportation Needs (10/28/2023)  Utilities: Not At Risk (10/28/2023)  Financial Resource Strain: Medium Risk (09/17/2023)   Received from [facility]  Social Connections: Socially Integrated (10/28/2023)  Tobacco Use: Medium Risk (10/28/2023)    Readmission Risk Interventions     No data to display

## 2023-11-02 NOTE — Plan of Care (Signed)
 Oral Amiodarone  has been started today. Monitoring urine output currently. The patient continues to be Aox4.  Problem: Education: Goal: Ability to describe self-care measures that may prevent or decrease complications (Diabetes Survival Skills Education) will improve Outcome: Progressing   Problem: Coping: Goal: Ability to adjust to condition or change in health will improve Outcome: Progressing   Problem: Fluid Volume: Goal: Ability to maintain a balanced intake and output will improve Outcome: Progressing   Problem: Health Behavior/Discharge Planning: Goal: Ability to identify and utilize available resources and services will improve Outcome: Progressing Goal: Ability to manage health-related needs will improve Outcome: Progressing   Problem: Metabolic: Goal: Ability to maintain appropriate glucose levels will improve Outcome: Progressing   Problem: Nutritional: Goal: Maintenance of adequate nutrition will improve Outcome: Progressing Goal: Progress toward achieving an optimal weight will improve Outcome: Progressing   Problem: Skin Integrity: Goal: Risk for impaired skin integrity will decrease Outcome: Progressing   Problem: Tissue Perfusion: Goal: Adequacy of tissue perfusion will improve Outcome: Progressing   Problem: Education: Goal: Knowledge of General Education information will improve Description: Including pain rating scale, medication(s)/side effects and non-pharmacologic comfort measures Outcome: Progressing   Problem: Health Behavior/Discharge Planning: Goal: Ability to manage health-related needs will improve Outcome: Progressing   Problem: Clinical Measurements: Goal: Ability to maintain clinical measurements within normal limits will improve Outcome: Progressing Goal: Will remain free from infection Outcome: Progressing Goal: Diagnostic test results will improve Outcome: Progressing Goal: Respiratory complications will improve Outcome:  Progressing Goal: Cardiovascular complication will be avoided Outcome: Progressing   Problem: Activity: Goal: Risk for activity intolerance will decrease Outcome: Progressing   Problem: Nutrition: Goal: Adequate nutrition will be maintained Outcome: Progressing   Problem: Coping: Goal: Level of anxiety will decrease Outcome: Progressing   Problem: Elimination: Goal: Will not experience complications related to bowel motility Outcome: Progressing Goal: Will not experience complications related to urinary retention Outcome: Progressing   Problem: Pain Managment: Goal: General experience of comfort will improve and/or be controlled Outcome: Progressing   Problem: Safety: Goal: Ability to remain free from injury will improve Outcome: Progressing   Problem: Skin Integrity: Goal: Risk for impaired skin integrity will decrease Outcome: Progressing

## 2023-11-02 NOTE — Progress Notes (Signed)
 Patient is noted to be resting in bed.  He is awake, alert, oriented x 3.  His wife was bedside.  He reports that he understands the big picture now and reports wanting to go home and be with his wife.  He understands that he has few days left with her and wants to make the best out of it.  Provider, patient and family discussed the previous encounter with the psychiatrist and initiating Effexor .  Patient offers no complaints.  He reports that his anxiety and depression are in control as he understands the big picture now.  He denies SI/HI/plan.  Patient is currently under hospice care.  Will recommend to continue the management with Effexor .  Will sign off at this time.

## 2023-11-02 NOTE — Progress Notes (Signed)
 PHARMACY CONSULT NOTE - FOLLOW UP  Pharmacy Consult for Electrolyte Monitoring and Replacement   Recent Labs: Potassium (mmol/L)  Date Value  11/02/2023 3.7  04/17/2012 3.8   Magnesium  (mg/dL)  Date Value  93/71/7974 2.1   Calcium  (mg/dL)  Date Value  93/71/7974 8.0 (L)   Calcium , Total (mg/dL)  Date Value  87/87/7986 9.0   Albumin (g/dL)  Date Value  93/73/7974 2.8 (L)  04/17/2012 4.1   Phosphorus (mg/dL)  Date Value  93/72/7974 2.5   Sodium (mmol/L)  Date Value  11/02/2023 132 (L)  04/17/2012 139     Assessment: 78 y.o. 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 Upon admission EKG revealed atrial fibrillation RVR with rates in the 140s. Pharmacy is asked to follow and replace electrolytes while in CCU  On amio gtt.   Diuretics: furosemide  160 mg IV BID  Goal of Therapy:  Potassium 4.0 - 5.1 mmol/L Magnesium  2.0 - 2.4 mg/dL All Other Electrolytes WNL  Plan:  KCL 40 mEq daily while on IV lasix .  F/u with AM labs.   Cathaleen GORMAN Blanch ,PharmD Clinical Pharmacist 11/02/2023 7:30 AM

## 2023-11-02 NOTE — Progress Notes (Signed)
 [facility] LIAISON NOTE  Received request from [name], Transitions of Care [facility]) for possible hospice services at home after discharge. Spoke with patient and wife Paul Goodman) to initiate education related to hospice philosophy, services, and team approach to care. Patient/family verbalized understanding of information given. Patient is in the IPU on pressors.  They were appreciative of information and are leaning towards hospice when he discharges.     DME needs discussed.   Patient has the following equipment in the home:  2 wheeled walker & cane  Patient/family requests the following equipment for delivery: To be determined closer to DC.               The address has been verified and is correct in the chart.  Paul Goodman, spouse, is the family contact to arrange time of equipment delivery.  Please send signed and completed DNR home with patient/family if applicable.   Please provide prescriptions at discharge as needed to ensure ongoing symptom management.  [facility] information and contact numbers given to Paul, spouse  Above information shared with Nichole Jobs, [facility] and hospital medical care team.  Please call with any hospice related questions or concerns.  Thank you for the opportunity to participate in this patient's care.  Saddie HILARIO Na, MA, BSN, RN, FNE Nurse Liaison [phone]

## 2023-11-03 DIAGNOSIS — I4891 Unspecified atrial fibrillation: Secondary | ICD-10-CM | POA: Diagnosis not present

## 2023-11-03 LAB — BASIC METABOLIC PANEL WITH GFR
Anion gap: 11 (ref 5–15)
BUN: 20 mg/dL (ref 8–23)
CO2: 30 mmol/L (ref 22–32)
Calcium: 8.2 mg/dL — ABNORMAL LOW (ref 8.9–10.3)
Chloride: 93 mmol/L — ABNORMAL LOW (ref 98–111)
Creatinine, Ser: 1 mg/dL (ref 0.61–1.24)
GFR, Estimated: >60 mL/min (ref >60)
Glucose, Bld: 164 mg/dL — ABNORMAL HIGH (ref 70–99)
Potassium: 3.7 mmol/L (ref 3.5–5.1)
Sodium: 134 mmol/L — ABNORMAL LOW (ref 135–145)

## 2023-11-03 LAB — COOXEMETRY PANEL
Carboxyhemoglobin: 1.1 % (ref 0.5–1.5)
Methemoglobin: 1 % (ref 0.0–1.5)
O2 Saturation: 55.9 %
Total hemoglobin: 13.9 g/dL (ref 12.0–16.0)
Total oxygen content: 54.8 %

## 2023-11-03 LAB — GLUCOSE, CAPILLARY
Glucose-Capillary: 182 mg/dL — ABNORMAL HIGH (ref 70–99)
Glucose-Capillary: 338 mg/dL — ABNORMAL HIGH (ref 70–99)
Glucose-Capillary: 299 mg/dL — ABNORMAL HIGH (ref 70–99)
Glucose-Capillary: 285 mg/dL — ABNORMAL HIGH (ref 70–99)

## 2023-11-03 MED ORDER — POTASSIUM CHLORIDE CRYS ER 20 MEQ PO TBCR
20.0000 meq | EXTENDED_RELEASE_TABLET | Freq: Once | ORAL | Status: AC
  Administered 2023-11-03: 20 meq via ORAL
  Filled 2023-11-03: qty 1

## 2023-11-03 MED ORDER — DIGOXIN 0.25 MG/ML IJ SOLN: 0.2500 mg | Freq: Once | INTRAMUSCULAR | Status: DC

## 2023-11-03 MED ORDER — DIGOXIN 0.25 MG/ML IJ SOLN
0.5000 mg | Freq: Once | INTRAMUSCULAR | Status: AC
  Administered 2023-11-03: 0.5 mg via INTRAVENOUS
  Filled 2023-11-03: qty 2

## 2023-11-03 MED ORDER — INSULIN GLARGINE-YFGN 100 UNIT/ML ~~LOC~~ SOLN
35.0000 [IU] | Freq: Every day | SUBCUTANEOUS | Status: DC
  Administered 2023-11-04 – 2023-11-05 (×2): 35 [IU] via SUBCUTANEOUS
  Filled 2023-11-03 (×2): qty 0.35

## 2023-11-03 MED ORDER — DIGOXIN 0.25 MG/ML IJ SOLN: 0.1250 mg | Freq: Every day | INTRAMUSCULAR | Status: DC

## 2023-11-03 NOTE — Progress Notes (Signed)
 [facility] Cardiology    SUBJECTIVE: Patient resting comfortably feels reasonably well improved shortness of breath no chest pain   Vitals:   11/03/23 1115 11/03/23 1130 11/03/23 1145 11/03/23 1200  BP: (!) 133/99 116/80 132/63 (!) 117/90  Pulse: 81 (!) 50 (!) 54 (!) 33  Resp: 14 12 (!) 24 (!) 23  Temp:    97.7 F (36.5 C)  TempSrc:    Oral  SpO2: 96% 97% 91% 92%  Weight:      Height:         Intake/Output Summary (Last 24 hours) at 11/03/2023 1233 Last data filed at 11/03/2023 1206 Gross per 24 hour  Intake 1133.84 ml  Output 4375 ml  Net -3241.16 ml      PHYSICAL EXAM  General: Well developed, well nourished, in no acute distress HEENT:  Normocephalic and atramatic Neck:  No JVD.  Lungs: Clear bilaterally to auscultation and percussion. Heart: HRRR . Normal S1 and S2 without gallops or murmurs.  Abdomen: Bowel sounds are positive, abdomen soft and non-tender  Msk:  Back normal, normal gait. Normal strength and tone for age. Extremities: No clubbing, cyanosis or edema.   Neuro: Alert and oriented X 3. Psych:  Good affect, responds appropriately   LABS: Basic Metabolic Panel: Recent Labs    11/01/23 0302 11/02/23 0332 11/03/23 0406  NA 137 132* 134*  K 3.7 3.7 3.7  CL 101 97* 93*  CO2 30 28 30   GLUCOSE 133* 167* 164*  BUN 31* 24* 20  CREATININE 0.98 0.89 1.00  CALCIUM  7.9* 8.0* 8.2*  MG 2.3 2.1  --   PHOS 2.5  --   --    Liver Function Tests: No results for input(s): AST, ALT, ALKPHOS, BILITOT, PROT, ALBUMIN in the last 72 hours. No results for input(s): LIPASE, AMYLASE in the last 72 hours. CBC: Recent Labs    11/01/23 0302  WBC 8.7  HGB 13.2  HCT 40.1  MCV 87.9  PLT 181   Cardiac Enzymes: No results for input(s): CKTOTAL, CKMB, CKMBINDEX, TROPONINI in the last 72 hours. BNP: Invalid input(s): POCBNP D-Dimer: No results for input(s): DDIMER in the last 72 hours. Hemoglobin A1C: No results for input(s): HGBA1C in  the last 72 hours. Fasting Lipid Panel: No results for input(s): CHOL, HDL, LDLCALC, TRIG, CHOLHDL, LDLDIRECT in the last 72 hours. Thyroid Function Tests: No results for input(s): TSH, T4TOTAL, T3FREE, THYROIDAB in the last 72 hours.  Invalid input(s): FREET3 Anemia Panel: No results for input(s): VITAMINB12, FOLATE, FERRITIN, TIBC, IRON, RETICCTPCT in the last 72 hours.  No results found.   Echo failure depressed left ventricular function EF of less than 20%  TELEMETRY: Atrial fibrillation rate of 150 nonspecific ST changes:  ASSESSMENT AND PLAN:  Principal Problem:   Atrial fibrillation with RVR (HCC) Active Problems:   COPD (chronic obstructive pulmonary disease) (HCC)   Hypertension   OSA on CPAP   Type II diabetes mellitus with renal manifestations (HCC)   Hyperlipidemia   Chronic systolic CHF (congestive heart failure) (HCC)   Depression with anxiety   Acute renal failure superimposed on stage 2 chronic kidney disease (HCC)   Obesity (BMI 30-39.9)   Stroke [facility])   Fall at home, initial encounter   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    Plan Cardiogenic shock requiring pressure therapy Levophed  has been discontinued maintain on dobutamine  patient much improved feeling reasonably well Atrial fibrillation rapid ventricular response rate up because of dobutamine  we will  add low-dose digoxin already on amiodarone  Hyperlipidemia continue current therapy with statin management Chronic systolic congestive heart failure severely depressed EF less than 20% continue current therapy appreciate advanced heart failure management COPD moderate continue inhalers as necessary supplemental oxygen as necessary Increase activity up out of bed to chair consider physical therapy Obstructive sleep apnea sleep study CPAP weight loss   Cara JONETTA Lovelace, MD, 11/03/2023 12:33 PM

## 2023-11-03 NOTE — Progress Notes (Signed)
 PHARMACY CONSULT NOTE - FOLLOW UP  Pharmacy Consult for Electrolyte Monitoring and Replacement   Recent Labs: Potassium (mmol/L)  Date Value  11/03/2023 3.7  04/17/2012 3.8   Magnesium  (mg/dL)  Date Value  93/71/7974 2.1   Calcium  (mg/dL)  Date Value  93/70/7974 8.2 (L)   Calcium , Total (mg/dL)  Date Value  87/87/7986 9.0   Albumin (g/dL)  Date Value  93/73/7974 2.8 (L)  04/17/2012 4.1   Phosphorus (mg/dL)  Date Value  93/72/7974 2.5   Sodium (mmol/L)  Date Value  11/03/2023 134 (L)  04/17/2012 139     Assessment: 78 y.o. 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 Upon admission EKG revealed atrial fibrillation RVR with rates in the 140s. Pharmacy is asked to follow and replace electrolytes while in CCU  On amio gtt. > amio PO.  Scr trending up.   Diuretics: furosemide  160 mg IV BID  Goal of Therapy:  Potassium 4.0 - 5.1 mmol/L Magnesium  2.0 - 2.4 mg/dL All Other Electrolytes WNL  Plan:  KCL 40 mEq daily while on IV lasix . KCL 20 mEq x 1.  F/u with AM labs.   Cathaleen GORMAN Blanch ,PharmD Clinical Pharmacist 11/03/2023 7:35 AM

## 2023-11-03 NOTE — Progress Notes (Addendum)
 12:35 pm: MD and cardiology notified: the patient's heart rate currently has been on afib between 115 to 144 beats per minute similar to when the amiodarone  drip was started on 11/01/23. the patient reports being asymptomatic and did receive his oral Amiodarone  200mg  this morning at 0900 am.   Cardiology has seen the patient in the room and has ordered Digoxin.  1553 PM:  MD and Cardiology have been notified: This patient reports his ears feel clogged up as if [place] the [place] and some lightheadedness. he might be developing Ototoxicity. He received his schedule dose of Laix 160mg  IVPB at 0911 am. and the only other new medication he received was Digoxin .5 mg IV push at  1330 pm. Reports he slept for a few minutes and woke up not being able to hear clearly.   1710 PM:  Cardiology has discontinued Digoxin.  1753 PM: current heart rate has been in the 110's for the most part. Occasionally will jump to the low 120's.

## 2023-11-03 NOTE — Progress Notes (Signed)
 [facility] ([facility]) Hospital Liaison Note  Follow up on new referral received for hospice at home when medically stable.  Patient remains in the ICU at this time on dobutamine  gtt and Lasix  IVPB 160mg  BID.    Amiodarone  gtt stopped this morning.  Once patient is medically stable for discharge home- hospital liaison team will need to discuss DME patient will need at home and have delivered.  This should take place no less than 24 hours prior to estimated discharge date. With patient remaining in ICU- DME needs assessment is premature at this time.  I did discuss yesterday briefly with patient and wife.  DME in home:  2 wheeled walker cane Possible DME needs:  Oxygen, ?hospital bed (patient wasn't sure about this), ? BSC, ? Standard wheelchair.   Hospital liaison team will continue to follow through final disposition.  Saddie HILARIO Na, RN Nurse Liaison [phone]

## 2023-11-03 NOTE — Progress Notes (Signed)
 [facility]  - [place] at [facility]   PATIENT NAME: Paul Goodman    MR#:  [id number]  DATE OF BIRTH:  10/09/1945  SUBJECTIVE:  No family at bedside. Currently on room air. Good urine output. Patient tells me he would like to go home with hospice at discharge. Currently on dobutamine  drip. Now on po amiodarone    VITALS:  Blood pressure (!) 117/90, pulse (!) 33, temperature 97.7 F (36.5 C), temperature source Oral, resp. rate (!) 23, height 6' 2 (1.88 m), weight 103.7 kg, SpO2 92%.  PHYSICAL EXAMINATION:   GENERAL:  78 y.o.-year-old patient with no acute distress. Appears weak and chronically ill LUNGS: decreased breath sounds bilaterally, no wheezing CARDIOVASCULAR: S1, S2 normal. No murmur   ABDOMEN: Soft, nontender, nondistended. Bowel sounds present.  EXTREMITIES: + edema b/l.    NEUROLOGIC: nonfocal  patient is alert and awake   LABORATORY PANEL:  CBC Recent Labs  Lab 11/01/23 0302  WBC 8.7  HGB 13.2  HCT 40.1  PLT 181    Chemistries  Recent Labs  Lab 10/31/23 0354 11/01/23 0302 11/02/23 0332 11/03/23 0406  NA 136   < > 132* 134*  K 3.6   < > 3.7 3.7  CL 102   < > 97* 93*  CO2 27   < > 28 30  GLUCOSE 137*   < > 167* 164*  BUN 39*   < > 24* 20  CREATININE 1.28*   < > 0.89 1.00  CALCIUM  7.6*   < > 8.0* 8.2*  MG 2.3   < > 2.1  --   AST 41  --   --   --   ALT 51*  --   --   --   ALKPHOS 120  --   --   --   BILITOT 1.2  --   --   --    < > = values in this interval not displayed.    Assessment and Plan  Paul Goodman is a 78 y.o. male with medical history significant of HTN HLD, DM, COPD, sCHF with EF < 20%, stroke, CKD 32, BPH, A-fib on Eliquis , OSA on CPAP, obesity, depression with anxiety, who presents with abnormal lab with hyperglycemia and hyperkalemia.   Acute on chronic systolic congestive heart failure/cardiogenic shock Atrial fibrillation with RVR: HR is 110-140s.  Possibly due to medication noncompliance. --  Continue Eliquis   - TSH level wnl --6/26 Levophed  weaned off, currently on dobutamine  -- advance CHF team cardiology consulted --6/27--increased lasix  to 160 mg bid --EF <20% --cont Dobutamine  gtt over the weekend --6/28-- patient on amiodarone  drip due to rapid a fib. Heartrate 97--110 --6/29--now on po amiodarone . Good uop   # Acute renal failure superimposed on stage 2 chronic kidney disease:  - s/p Lasix  and spironolactone  --creat 0.98   # Hypertension:  --Patient was on Coreg  25 mg p.o. twice daily.--now d/ced by cardiology    # Type II diabetes mellitus with renal manifestations:  --Recent A1c 7.7, poorly controlled.  Patient is supposed to take Victoza and glargine insulin  60 units daily, but not taking medications currently.  Blood sugar 543 with normal anion gap 11 -- Hemoglobin A1c 12.8 uncontrolled - Start glargine insulin  45 units daily - SSI   # Depression with anxiety -As needed Ativan  --started Effexor  seen by psych   # COPD (chronic obstructive pulmonary disease): No wheezes -Bronchodilators as needed Mucinex    # Hyperlipidemia: on Crestor  and fenofibrate    # Stroke:  Crestor  - Patient is on Eliquis  for A-fib   # Vitamin D  insufficiency: started vitamin D  50,000 units p.o. weekly, follow with PCP to repeat vitamin D  level after 3 to 6 months.   # Fall at home, initial encounter: CT -head negative for injury - Continue fall precaution - PT/OT--out pt rehab   # OSA: on CPAP   # Palliative care consulted Long-term prognosis is poor due to extensive cardiac history and depression.  As per palliative care, patient was made DNR/DNI. He wants to go home with hospice when he is medically optimized to do so.    Body mass index is 31.16 kg/m.  Interventions:  Procedures: Family communication :none today Consults :[facility] cards CODE STATUS: DNR DVT Prophylaxis : Level of care: ICU Status is: Inpatient Remains inpatient appropriate because: CHF and on  Dobutamine  gtt    TOTAL TIME TAKING CARE OF THIS PATIENT: 35 minutes.  >50% time spent on counselling and coordination of care  Note: This dictation was prepared with Dragon dictation along with smaller phrase technology. Any transcriptional errors that result from this process are unintentional.  Leita Blanch M.D    [facility]   CC: Primary care physician; Valora Agent, MD

## 2023-11-03 NOTE — Plan of Care (Signed)
  Problem: Coping: Goal: Ability to adjust to condition or change in health will improve Outcome: Progressing   Problem: Fluid Volume: Goal: Ability to maintain a balanced intake and output will improve Outcome: Progressing   Problem: Metabolic: Goal: Ability to maintain appropriate glucose levels will improve Outcome: Not Progressing   Problem: Tissue Perfusion: Goal: Adequacy of tissue perfusion will improve Outcome: Progressing   Problem: Clinical Measurements: Goal: Will remain free from infection Outcome: Progressing Goal: Diagnostic test results will improve Outcome: Progressing Goal: Respiratory complications will improve Outcome: Progressing Goal: Cardiovascular complication will be avoided Outcome: Progressing   Problem: Activity: Goal: Risk for activity intolerance will decrease Outcome: Not Progressing   Problem: Elimination: Goal: Will not experience complications related to bowel motility Outcome: Progressing Goal: Will not experience complications related to urinary retention Outcome: Progressing   Problem: Pain Managment: Goal: General experience of comfort will improve and/or be controlled Outcome: Progressing   Problem: Safety: Goal: Ability to remain free from injury will improve Outcome: Progressing

## 2023-11-03 NOTE — Plan of Care (Signed)
 The patient has been sitting up in the chair for most of the day. Chair and bed alarm in place. ACHS blood glucose monitoring. The patient continues to be ACHS.  Problem: Education: Goal: Ability to describe self-care measures that may prevent or decrease complications (Diabetes Survival Skills Education) will improve Outcome: Progressing   Problem: Coping: Goal: Ability to adjust to condition or change in health will improve Outcome: Progressing   Problem: Fluid Volume: Goal: Ability to maintain a balanced intake and output will improve Outcome: Progressing   Problem: Health Behavior/Discharge Planning: Goal: Ability to identify and utilize available resources and services will improve Outcome: Progressing Goal: Ability to manage health-related needs will improve Outcome: Progressing   Problem: Metabolic: Goal: Ability to maintain appropriate glucose levels will improve Outcome: Progressing   Problem: Nutritional: Goal: Maintenance of adequate nutrition will improve Outcome: Progressing Goal: Progress toward achieving an optimal weight will improve Outcome: Progressing   Problem: Skin Integrity: Goal: Risk for impaired skin integrity will decrease Outcome: Progressing   Problem: Tissue Perfusion: Goal: Adequacy of tissue perfusion will improve Outcome: Progressing   Problem: Education: Goal: Knowledge of General Education information will improve Description: Including pain rating scale, medication(s)/side effects and non-pharmacologic comfort measures Outcome: Progressing   Problem: Health Behavior/Discharge Planning: Goal: Ability to manage health-related needs will improve Outcome: Progressing   Problem: Clinical Measurements: Goal: Ability to maintain clinical measurements within normal limits will improve Outcome: Progressing Goal: Will remain free from infection Outcome: Progressing Goal: Diagnostic test results will improve Outcome: Progressing Goal:  Respiratory complications will improve Outcome: Progressing Goal: Cardiovascular complication will be avoided Outcome: Progressing   Problem: Activity: Goal: Risk for activity intolerance will decrease Outcome: Progressing   Problem: Nutrition: Goal: Adequate nutrition will be maintained Outcome: Progressing   Problem: Coping: Goal: Level of anxiety will decrease Outcome: Progressing   Problem: Elimination: Goal: Will not experience complications related to bowel motility Outcome: Progressing Goal: Will not experience complications related to urinary retention Outcome: Progressing   Problem: Pain Managment: Goal: General experience of comfort will improve and/or be controlled Outcome: Progressing   Problem: Safety: Goal: Ability to remain free from injury will improve Outcome: Progressing   Problem: Skin Integrity: Goal: Risk for impaired skin integrity will decrease Outcome: Progressing

## 2023-11-04 ENCOUNTER — Other Ambulatory Visit (HOSPITAL_COMMUNITY): Payer: Self-pay

## 2023-11-04 ENCOUNTER — Telehealth (HOSPITAL_COMMUNITY): Payer: Self-pay | Admitting: Pharmacy Technician

## 2023-11-04 DIAGNOSIS — I4891 Unspecified atrial fibrillation: Secondary | ICD-10-CM | POA: Diagnosis not present

## 2023-11-04 LAB — GLUCOSE, CAPILLARY
Glucose-Capillary: 160 mg/dL — ABNORMAL HIGH (ref 70–99)
Glucose-Capillary: 304 mg/dL — ABNORMAL HIGH (ref 70–99)
Glucose-Capillary: 243 mg/dL — ABNORMAL HIGH (ref 70–99)
Glucose-Capillary: 336 mg/dL — ABNORMAL HIGH (ref 70–99)

## 2023-11-04 LAB — COOXEMETRY PANEL
Carboxyhemoglobin: 2.1 % — ABNORMAL HIGH (ref 0.5–1.5)
Carboxyhemoglobin: 2.2 % — ABNORMAL HIGH (ref 0.5–1.5)
Carboxyhemoglobin: 1.3 % (ref 0.5–1.5)
Methemoglobin: 1.1 % (ref 0.0–1.5)
Methemoglobin: 1.1 % (ref 0.0–1.5)
Methemoglobin: 0.7 % (ref 0.0–1.5)
O2 Saturation: 73.3 %
O2 Saturation: 70.8 %
O2 Saturation: 60 %
Total hemoglobin: 16.2 g/dL — ABNORMAL HIGH (ref 12.0–16.0)
Total hemoglobin: 15.1 g/dL (ref 12.0–16.0)
Total hemoglobin: 16.3 g/dL — ABNORMAL HIGH (ref 12.0–16.0)
Total oxygen content: 71 %
Total oxygen content: 68.5 %
Total oxygen content: 58.8 %

## 2023-11-04 LAB — RENAL FUNCTION PANEL
Albumin: 3.1 g/dL — ABNORMAL LOW (ref 3.5–5.0)
Anion gap: 12 (ref 5–15)
BUN: 22 mg/dL (ref 8–23)
CO2: 30 mmol/L (ref 22–32)
Calcium: 8.6 mg/dL — ABNORMAL LOW (ref 8.9–10.3)
Chloride: 92 mmol/L — ABNORMAL LOW (ref 98–111)
Creatinine, Ser: 1.07 mg/dL (ref 0.61–1.24)
GFR, Estimated: >60 mL/min (ref >60)
Glucose, Bld: 162 mg/dL — ABNORMAL HIGH (ref 70–99)
Phosphorus: 2.6 mg/dL (ref 2.5–4.6)
Potassium: 3.8 mmol/L (ref 3.5–5.1)
Sodium: 134 mmol/L — ABNORMAL LOW (ref 135–145)

## 2023-11-04 LAB — MAGNESIUM: Magnesium: 2.1 mg/dL (ref 1.7–2.4)

## 2023-11-04 MED ORDER — ENSURE PLUS HIGH PROTEIN PO LIQD
237.0000 mL | Freq: Two times a day (BID) | ORAL | Status: DC
  Administered 2023-11-04 – 2023-11-05 (×2): 237 mL via ORAL

## 2023-11-04 MED ORDER — TORSEMIDE 20 MG PO TABS
20.0000 mg | ORAL_TABLET | Freq: Every day | ORAL | Status: DC
  Filled 2023-11-04: qty 1

## 2023-11-04 MED ORDER — DOBUTAMINE-DEXTROSE 4-5 MG/ML-% IV SOLN
2.5000 ug/kg/min | INTRAVENOUS | Status: DC
  Administered 2023-11-04: 2.5 ug/kg/min via INTRAVENOUS

## 2023-11-04 MED ORDER — POTASSIUM CHLORIDE CRYS ER 20 MEQ PO TBCR
20.0000 meq | EXTENDED_RELEASE_TABLET | Freq: Once | ORAL | Status: AC
  Administered 2023-11-04: 20 meq via ORAL
  Filled 2023-11-04: qty 1

## 2023-11-04 MED ORDER — LOSARTAN POTASSIUM 50 MG PO TABS
25.0000 mg | ORAL_TABLET | Freq: Every day | ORAL | Status: DC
  Administered 2023-11-05: 25 mg via ORAL
  Filled 2023-11-04: qty 1

## 2023-11-04 MED ORDER — SPIRONOLACTONE 12.5 MG HALF TABLET
12.5000 mg | ORAL_TABLET | Freq: Every day | ORAL | Status: DC
  Administered 2023-11-05: 12.5 mg via ORAL
  Filled 2023-11-04: qty 1

## 2023-11-04 MED ORDER — SACUBITRIL-VALSARTAN 24-26 MG PO TABS
1.0000 | ORAL_TABLET | Freq: Two times a day (BID) | ORAL | Status: DC
  Administered 2023-11-04: 1 via ORAL
  Filled 2023-11-04: qty 1

## 2023-11-04 NOTE — Progress Notes (Signed)
 [facility] Liaison Note  Spoke with patient's spouse today.  She stated that they are thinking patient may discharge home in a few days.  She said that patient may need a hospital bed and a WC, but would like to see how he does over the next day or so before making a decision.  HL will follow up with patient and spouse tomorrow.  Please call with any Hospice related questions or concerns  Thank you for the opportunity to participate in this patient's care  [facility] Liaison [phone]

## 2023-11-04 NOTE — Plan of Care (Signed)
 Plan remains for Paul Goodman to discharge home with hospice services once medically optimized. [facility] hospice liaison engaged and following. No ongoing acute palliative needs at this time. Primary team aware. Consult to be discontinued. Please reengage for needs or concerns as they arise.  No Charge.  Paul Lesches, DNP, AGNP-C Palliative Medicine  Please call Palliative Medicine team phone with any questions [phone]. For individual providers please see AMION.

## 2023-11-04 NOTE — Plan of Care (Signed)
  Problem: Coping: Goal: Ability to adjust to condition or change in health will improve Outcome: Progressing   Problem: Fluid Volume: Goal: Ability to maintain a balanced intake and output will improve Outcome: Progressing   Problem: Metabolic: Goal: Ability to maintain appropriate glucose levels will improve Outcome: Not Progressing   Problem: Nutritional: Goal: Progress toward achieving an optimal weight will improve Outcome: Progressing   Problem: Skin Integrity: Goal: Risk for impaired skin integrity will decrease Outcome: Progressing   Problem: Education: Goal: Knowledge of General Education information will improve Description: Including pain rating scale, medication(s)/side effects and non-pharmacologic comfort measures Outcome: Progressing   Problem: Clinical Measurements: Goal: Ability to maintain clinical measurements within normal limits will improve Outcome: Progressing Goal: Will remain free from infection Outcome: Progressing Goal: Diagnostic test results will improve Outcome: Progressing Goal: Respiratory complications will improve Outcome: Progressing Goal: Cardiovascular complication will be avoided Outcome: Progressing

## 2023-11-04 NOTE — Progress Notes (Signed)
 PHARMACY CONSULT NOTE - FOLLOW UP  Pharmacy Consult for Electrolyte Monitoring and Replacement   Recent Labs: Potassium (mmol/L)  Date Value  11/04/2023 3.8  04/17/2012 3.8   Magnesium  (mg/dL)  Date Value  93/69/7974 2.1   Calcium  (mg/dL)  Date Value  93/69/7974 8.6 (L)   Calcium , Total (mg/dL)  Date Value  87/87/7986 9.0   Albumin (g/dL)  Date Value  93/69/7974 3.1 (L)  04/17/2012 4.1   Phosphorus (mg/dL)  Date Value  93/69/7974 2.6   Sodium (mmol/L)  Date Value  11/04/2023 134 (L)  04/17/2012 139     Assessment: 78 y.o. 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 Upon admission EKG revealed atrial fibrillation RVR with rates in the 140s. Pharmacy is asked to follow and replace electrolytes while in CCU  On amio gtt. > amio PO.  Scr trending up.   Diuretics: furosemide  160 mg IV BID  Goal of Therapy:  Potassium 4.0 - 5.1 mmol/L Magnesium  2.0 - 2.4 mg/dL All Other Electrolytes WNL  Plan:  KCL 40 mEq daily while on IV lasix . KCL 20 mEq x 1.  F/u with AM labs.   Paul Goodman ,PharmD Clinical Pharmacist 11/04/2023 7:26 AM

## 2023-11-04 NOTE — Progress Notes (Signed)
 [facility] CLINIC CARDIOLOGY PROGRESS NOTE       Patient ID: Paul Goodman MRN: [id number] DOB/AGE: 02/04/1946 78 y.o.  Admit date: 10/28/2023 Referring Physician Dr. Von Primary Physician Valora Agent, MD Primary Cardiologist Dr. Marshia Blanch Reason for Consultation AF RVR  HPI: Paul Goodman is a 78 y.o. male  with a past medical history of permanent atrial fibrillation (on Eliquis ), chronic HFrEF (EF < 20%), nonischemic cardiomyopathy, pulmonary hypertension, hyperlipidemia, hypertension, history CVA, diabetes mellitus, OSA (on CPAP), obesity who presented to the ED on 10/28/2023 sent by pulmonology due to extremely elevated blood glucose and hyperkalemia.  Upon admission EKG revealed atrial fibrillation RVR with rates in the 140s. Cardiology was consulted for further evaluation.   Interval History: -Patient seen and examined this afternoon and sitting in bedside chair. Patient states he feels good and reports SOB is improving and denies chest pain.  Patient is talkative and in good spirits. -BP continues to improve and heart rate elevated.  Per telemetry remains in atrial fibrillation with improved rates 90-100s. -UOP 3.1L yesterday with stable renal function.  -Patient remains on room air with stable SpO2.  -Continues to be on dobutamine  infusion. - Advanced heart failure team following.  Pertinent Cardiac History (Most recent) RHC/LHC (04/29/2023)   There is severe left ventricular systolic dysfunction.   LV end diastolic pressure is mildly elevated.   The left ventricular ejection fraction is less than 25% by visual estimate.   Hemodynamic findings consistent with pulmonary hypertension.   No indication for antiplatelet therapy at this time .  Right heart -Wedge mean of 20 -PA mean 24   Left ventriculogram -Depressed left ventricular function globally left ventricular enlargement EF of around 25%   Coronaries  -Normal coronaries left dominant system   -Study consistent with a nonischemic cardiomyopathy  Review of systems complete and found to be negative unless listed above    Past Medical History:  Diagnosis Date   Anxiety    Arthritis    CHF (congestive heart failure) (HCC)    COPD (chronic obstructive pulmonary disease) (HCC)    Diabetes (HCC)    ED (erectile dysfunction)    Heart murmur    Hyperlipidemia    Hypertension    Melanoma (HCC)    Sleep apnea     Past Surgical History:  Procedure Laterality Date   EYE SURGERY Right    cancer   HAND SURGERY Left 1966   KNEE SURGERY Left 1973   RIGHT/LEFT HEART CATH AND CORONARY ANGIOGRAPHY Bilateral 04/29/2023   Procedure: RIGHT/LEFT HEART CATH AND CORONARY ANGIOGRAPHY;  Surgeon: Florencio Cara BIRCH, MD;  Location: [facility] INVASIVE CV LAB;  Service: Cardiovascular;  Laterality: Bilateral;   TONSILLECTOMY  1964    Medications Prior to Admission  Medication Sig Dispense Refill Last Dose/Taking   rOPINIRole  (REQUIP  XL) 4 MG 24 hr tablet Take 12-15 mg by mouth at bedtime.   Past Week   albuterol  (PROVENTIL  HFA;VENTOLIN  HFA) 108 (90 BASE) MCG/ACT inhaler Inhale 2 puffs into the lungs every 6 (six) hours as needed for shortness of breath.      amiodarone  (PACERONE ) 200 MG tablet Take 1 tablet (200 mg total) by mouth 2 (two) times daily. (Patient not taking: Reported on 10/29/2023) 60 tablet 2 Not Taking   amLODipine (NORVASC) 10 MG tablet Take 10 mg by mouth daily. (Patient not taking: Reported on 10/29/2023)   Not Taking   apixaban  (ELIQUIS ) 5 MG TABS tablet Take 1 tablet (5 mg total) by mouth 2 (two)  times daily. (Patient not taking: Reported on 10/29/2023) 60 tablet 3 Not Taking   carvedilol  (COREG ) 25 MG tablet Take 1 tablet (25 mg total) by mouth 2 (two) times daily with a meal. (Patient not taking: Reported on 10/29/2023) 60 tablet 3 Not Taking   Chromium Picolinate (CHROMIUM PICOLATE PO) Take by mouth. (Patient not taking: Reported on 10/29/2023)   Not Taking    dextromethorphan -guaiFENesin  (MUCINEX  DM) 30-600 MG 12hr tablet Take 1 tablet by mouth every evening.      fenofibrate  160 MG tablet Take 160 mg by mouth daily. (Patient not taking: Reported on 10/29/2023)   Not Taking   furosemide  (LASIX ) 20 MG tablet Take 20 mg by mouth daily as needed. (Patient not taking: Reported on 10/29/2023)   Not Taking   hydrochlorothiazide  (HYDRODIURIL ) 25 MG tablet Take 25 mg by mouth daily. (Patient not taking: Reported on 10/29/2023)   Not Taking   Insulin  Glargine (BASAGLAR  KWIKPEN) 100 UNIT/ML Inject 60 Units into the skin daily. (Patient not taking: Reported on 10/29/2023)   Not Taking   liraglutide (VICTOZA) 18 MG/3ML SOPN Inject 1.2 mg into the skin once a week. (Patient not taking: Reported on 10/29/2023)   Not Taking   LORazepam  (ATIVAN ) 1 MG tablet Take 0.5-1 mg by mouth daily as needed. TAKE ONE-HALF (1/2) TO ONE (1) TABLET BY MOUTH DAILY AS NEEDED FOR ANXIETY, PANIC, OR THROAT SWELLING (Patient not taking: Reported on 04/29/2023)      losartan  (COZAAR ) 100 MG tablet Take 100 mg by mouth daily. (Patient not taking: Reported on 10/29/2023)   Not Taking   PARoxetine  (PAXIL ) 20 MG tablet Take 20 mg by mouth daily. (Patient not taking: Reported on 10/29/2023)   Not Taking   pramipexole (MIRAPEX) 0.125 MG tablet TAKE 1 TABLET BY MOUTH 2 HOURS BEFORE BEDTIME, MAY TITRATE BY 1 TABLET PER WEEK AS NEEDED MAXIMUM 4 TABLETS. (Patient not taking: Reported on 04/29/2023)   Not Taking   rosuvastatin  (CRESTOR ) 5 MG tablet Take 5 mg by mouth daily. (Patient not taking: Reported on 10/29/2023)   Not Taking   spironolactone  (ALDACTONE ) 25 MG tablet Take 0.5 tablets (12.5 mg total) by mouth daily. (Patient not taking: Reported on 10/29/2023) 30 tablet 3 Not Taking   VANADIUM PO Take by mouth.      Zinc  Sulfate (ZINC  15 PO) Take by mouth. (Patient not taking: Reported on 10/29/2023)   Not Taking   Social History   Socioeconomic History   Marital status: Married    Spouse name: Not on  file   Number of children: Not on file   Years of education: Not on file   Highest education level: Not on file  Occupational History   Not on file  Tobacco Use   Smoking status: Former    Types: Cigarettes   Smokeless tobacco: Former    Types: Chew   Tobacco comments:    quit 1992  Vaping Use   Vaping status: Never Used  Substance and Sexual Activity   Alcohol use: Not Currently    Alcohol/week: 14.0 standard drinks of alcohol    Types: 14 Standard drinks or equivalent per week   Drug use: Not Currently    Types: Marijuana   Sexual activity: Not Currently  Other Topics Concern   Not on file  Social History Narrative   Not on file   Social Drivers of Health   Financial Resource Strain: Medium Risk (09/17/2023)   Received from [facility]   Overall Financial  Resource Strain (CARDIA)    Difficulty of Paying Living Expenses: Somewhat hard  Food Insecurity: Food Insecurity Present (10/28/2023)   Hunger Vital Sign    Worried About Running Out of Food in the Last Year: Sometimes true    Ran Out of Food in the Last Year: Sometimes true  Transportation Needs: No Transportation Needs (10/28/2023)   PRAPARE - Administrator, Civil Service (Medical): No    Lack of Transportation (Non-Medical): No  Physical Activity: Not on file  Stress: Not on file  Social Connections: Socially Integrated (10/28/2023)   Social Connection and Isolation Panel    Frequency of Communication with Friends and Family: Three times a week    Frequency of Social Gatherings with Friends and Family: Three times a week    Attends Religious Services: 1 to 4 times per year    Active Member of Clubs or Organizations: Yes    Attends Banker Meetings: 1 to 4 times per year    Marital Status: Married  Catering manager Violence: Not At Risk (10/28/2023)   Humiliation, Afraid, Rape, and Kick questionnaire    Fear of Current or Ex-Partner: No    Emotionally Abused: No     Physically Abused: No    Sexually Abused: No    Family History  Problem Relation Age of Onset   Kidney Stones Father    Kidney disease Neg Hx    Prostate cancer Neg Hx    Kidney cancer Neg Hx    Bladder Cancer Neg Hx      Vitals:   11/04/23 1215 11/04/23 1230 11/04/23 1245 11/04/23 1300  BP: (!) 164/67  136/70 (!) 99/59  Pulse: (!) 110 82 71 67  Resp: (!) 22 17 (!) 21 13  Temp:      TempSrc:      SpO2: 96% 97% 95% 97%  Weight:      Height:        PHYSICAL EXAM General: Chronically ill appearing elderly male, well nourished, in no acute distress. HEENT: Normocephalic and atraumatic. Neck: No JVD.   Lungs: Normal respiratory effort on room air. CTAB Heart: Irregularly irregular, controlled rate. Normal S1 and S2 without gallops or murmurs.  Abdomen: Non-distended appearing.  Msk: Normal strength and tone for age. Extremities: Warm and well perfused.  No cyanosis, clubbing, edema   Labs: Basic Metabolic Panel: Recent Labs    11/02/23 0332 11/03/23 0406 11/04/23 0417  NA 132* 134* 134*  K 3.7 3.7 3.8  CL 97* 93* 92*  CO2 28 30 30   GLUCOSE 167* 164* 162*  BUN 24* 20 22  CREATININE 0.89 1.00 1.07  CALCIUM  8.0* 8.2* 8.6*  MG 2.1  --  2.1  PHOS  --   --  2.6   Liver Function Tests: Recent Labs    11/04/23 0417  ALBUMIN 3.1*   No results for input(s): LIPASE, AMYLASE in the last 72 hours. CBC: No results for input(s): WBC, NEUTROABS, HGB, HCT, MCV, PLT in the last 72 hours.  Cardiac Enzymes: No results for input(s): CKTOTAL, CKMB, CKMBINDEX, TROPONINIHS in the last 72 hours. BNP: No results for input(s): BNP in the last 72 hours.  D-Dimer: No results for input(s): DDIMER in the last 72 hours. Hemoglobin A1C: No results for input(s): HGBA1C in the last 72 hours.  Fasting Lipid Panel: No results for input(s): CHOL, HDL, LDLCALC, TRIG, CHOLHDL, LDLDIRECT in the last 72 hours. Thyroid Function Tests: No results  for input(s): TSH, T4TOTAL, T3FREE,  THYROIDAB in the last 72 hours.  Invalid input(s): FREET3  Anemia Panel: No results for input(s): VITAMINB12, FOLATE, FERRITIN, TIBC, IRON, RETICCTPCT in the last 72 hours.    Radiology: US  Abdomen Limited RUQ (LIVER/GB) Result Date: 10/30/2023 CLINICAL DATA:  Elevated LFTs. EXAM: ULTRASOUND ABDOMEN LIMITED RIGHT UPPER QUADRANT COMPARISON:  None available FINDINGS: Gallbladder: No gallstones or wall thickening visualized. No sonographic Murphy sign noted by sonographer. Common bile duct: Diameter: 5 mm Liver: There is diffuse increased liver echogenicity most commonly seen in the setting of fatty infiltration. Superimposed inflammation or fibrosis is not excluded. Clinical correlation is recommended. Portal vein is patent on color Doppler imaging with normal direction of blood flow towards the liver. Other: None. IMPRESSION: Fatty liver, otherwise unremarkable right upper quadrant ultrasound. Electronically Signed   By: Vanetta Chou M.D.   On: 10/30/2023 16:18   DG Chest Port 1 View Result Date: 10/30/2023 CLINICAL DATA:  Central line placement. EXAM: PORTABLE CHEST 1 VIEW COMPARISON:  October 29, 2023 FINDINGS: Since the prior study there is been interval placement of a left-sided internal jugular venous catheter. Its distal tip is seen at the level of the right hilum within the distal portion of the superior vena cava. The cardiac silhouette is mildly enlarged and unchanged in size. Low lung volumes are noted. Stable prominence of the pulmonary vasculature is seen with associated increased interstitial lung markings. Mild to moderate severity areas of atelectasis and/or infiltrate are present within the bilateral lung bases. No pleural effusion or pneumothorax is identified. Multilevel degenerative changes are seen throughout the thoracic spine. IMPRESSION: 1. Interval left-sided internal jugular venous catheter placement and positioning, as  described above, without evidence of pneumothorax. 2. Stable cardiomegaly with mild to moderate severity pulmonary vascular congestion and pulmonary edema. 3. Mild to moderate severity bibasilar atelectasis and/or infiltrate. Electronically Signed   By: Suzen Dials M.D.   On: 10/30/2023 12:30   DG Chest Port 1 View Result Date: 10/29/2023 CLINICAL DATA:  Congestive heart failure. EXAM: PORTABLE CHEST 1 VIEW COMPARISON:  February 09, 2023. FINDINGS: Mild cardiomegaly is noted with mild central pulmonary vascular congestion. Bibasilar pulmonary edema is noted. Bony thorax is unremarkable. IMPRESSION: Mild cardiomegaly with mild central pulmonary vascular congestion and bilateral pulmonary edema. Electronically Signed   By: Lynwood Landy Raddle M.D.   On: 10/29/2023 16:30   CT HEAD WO CONTRAST ( ) Result Date: 10/28/2023 CLINICAL DATA:  Recent syncopal episode EXAM: CT HEAD WITHOUT CONTRAST TECHNIQUE: Contiguous axial images were obtained from the base of the skull through the vertex without intravenous contrast. RADIATION DOSE REDUCTION: This exam was performed according to the departmental dose-optimization program which includes automated exposure control, adjustment of the mA and/or kV according to patient size and/or use of iterative reconstruction technique. COMPARISON:  02/09/2023 FINDINGS: Brain: No evidence of acute infarction, hemorrhage, hydrocephalus, extra-axial collection or mass lesion/mass effect. Remote right frontoparietal infarct is again seen and stable. Vascular calcifications are noted in the centrum semi ovale stable from the prior exam. Basal ganglia calcifications are seen as well. Vascular: No hyperdense vessel or unexpected calcification. Skull: Normal. Negative for fracture or focal lesion. Sinuses/Orbits: No acute finding. Other: None. IMPRESSION: Chronic right frontoparietal infarct.  No acute abnormality noted. Electronically Signed   By: Oneil Devonshire M.D.   On: 10/28/2023 19:47     ECHO pending  TELEMETRY reviewed by me 11/04/2023: Atrial fibrillation, rate 90-100s  EKG reviewed by me: Atrial fibrillation RVR rate 131 bpm  Data reviewed by me 11/04/2023:  last 24h vitals tele labs imaging I/O hospitalist progress notes.  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 male  with a past medical history of permanent atrial fibrillation (on Eliquis ), chronic HFrEF (EF < 20%), nonischemic cardiomyopathy, pulmonary hypertension, hyperlipidemia, hypertension history CVA, diabetes mellitus, OSA (on CPAP), obesity who presented to the ED on 10/28/2023 sent by pulmonology due to extremely elevated blood glucose and hyperkalemia.  Upon admission EKG revealed atrial fibrillation RVR with rates in the 140s. Cardiology was consulted for further evaluation.   # Atrial fibrillation RVR # Permanent atrial fibrillation Patient denies chest pain, palpitations. EKG in ED with atrial fibrillation rate 131 bpm. Patient started on IV amio infusion. Patient takes PO amio 200 mg at home. Per tele in AF with improving HR in 90-100s -Monitor and replenish electrolytes for a goal K >4, Mag >2  -Continue PO Amio BID. Hoping rate will improve when weaning off dobutamine  support. -Continue Eliquis  5 mg twice daily for stroke risk reduction. -Holding Coreg  as stated below.  # Cardiogenic shock # Acute on chronic HFrEF # Nonischemic  cardiomyopathy # Medication noncompliance Patient with stated noncompliance to medication.  Patient states he does not take his cardiac medications as prescribed at home because he believes the medications do not help him.  Presents with worsening SOB, orthopnea and LEE. CXR with mild cardiomegaly and pulmonary vascular congestion and bilateral pulmonary edema. BNP elevated at 990. Poor UOP yesterday (06/24) s/p IV lasix  80 mg. Patient confused, cold, hypotensive this AM (06/25), transferred to ICU and started on levo and dobutamine  infusion due to low perfusion/low CO, cardiogenic shock. Lactic acid 1.2 (06/25). POCUS echo revealed EF around 10%. On 06/26 weaned off levo gtt around 1600 on 06/25 and continues to be on dobutamine . BP improving. -Echo pending.   -Advanced heart failure consulted, appreciate recommendations.  -Continue dobutamine  per heart failure team. Plans to wean off dobutamine . -IV lasix  160 mg per heart failure team. Closely monitor renal function and UOP. (Patient prescribed Lasix  20 mg daily at home however states he does not take this.) -Ordered Entresto  24/26 mg BID for afterload reducer per ADHF team. -Holding Coreg  for now. Plan to resume when hemodynamically stable and closer to euvolemia.  -Holding spiro for now. -Hold off on starting dapagliflozin  due to uncontrolled A1C of 12.8% ([place] $ 38.08) -Plan to resume and optimize GDMT when patient off inotrope and hemodynamically stable.  -Per ADHF team with biventricular dysfunction with LVEF < 20%, not VAD candidate. Considering home hospice at discharge.  # Hypertension # Hyperlipidemia Patient hypotensive, started on dobutamine  and levo infusion. Patient weaned off levo and continues to be on dobutamine .   -Continue rosuvastatin  5 mg daily, fenofibrate  160 mg daily.   This patient's plan of care was discussed and created with Dr. Wilburn and he is in agreement.  Signed: Dorene Comfort, PA-C  11/04/2023, 3:52  PM [facility]

## 2023-11-04 NOTE — Progress Notes (Signed)
 [facility]  - [place] [facility]   PATIENT NAME: Paul Goodman    MR#:  [id number]  DATE OF BIRTH:  1945-09-20  SUBJECTIVE:  No family at bedside. Currently on room air. Good urine output. Patient tells me he would like to go home with hospice at discharge. Currently on dobutamine  drip decreased to 2.5 mcg Now on po amiodarone    VITALS:  Blood pressure (!) 99/59, pulse 67, temperature 98.1 F (36.7 C), temperature source Oral, resp. rate 13, height 6' 2 (1.88 m), weight 102.8 kg, SpO2 97%.  PHYSICAL EXAMINATION:   GENERAL:  78 y.o.-year-old patient with no acute distress. Appears weak and chronically ill LUNGS: decreased breath sounds bilaterally, no wheezing CARDIOVASCULAR: S1, S2 normal. No murmur   ABDOMEN: Soft, nontender, nondistended. Bowel sounds present.  EXTREMITIES: + edema b/l.    NEUROLOGIC: nonfocal  patient is alert and awake   LABORATORY PANEL:  CBC Recent Labs  Lab 11/01/23 0302  WBC 8.7  HGB 13.2  HCT 40.1  PLT 181    Chemistries  Recent Labs  Lab 10/31/23 0354 11/01/23 0302 11/04/23 0417  NA 136   < > 134*  K 3.6   < > 3.8  CL 102   < > 92*  CO2 27   < > 30  GLUCOSE 137*   < > 162*  BUN 39*   < > 22  CREATININE 1.28*   < > 1.07  CALCIUM  7.6*   < > 8.6*  MG 2.3   < > 2.1  AST 41  --   --   ALT 51*  --   --   ALKPHOS 120  --   --   BILITOT 1.2  --   --    < > = values in this interval not displayed.    Assessment and Plan  CARZELL SALDIVAR is a 78 y.o. male with medical history significant of HTN HLD, DM, COPD, sCHF with EF < 20%, stroke, CKD 32, BPH, A-fib on Eliquis , OSA on CPAP, obesity, depression with anxiety, who presents with abnormal lab with hyperglycemia and hyperkalemia.   Acute on chronic systolic congestive heart failure/cardiogenic shock Atrial fibrillation with RVR: HR is 110-140s.  Possibly due to medication noncompliance. -- Continue Eliquis   - TSH level wnl --6/26 Levophed  weaned off,  currently on dobutamine  -- advance CHF team cardiology consulted --6/27--increased lasix  to 160 mg bid --EF <20% --cont Dobutamine  gtt over the weekend --6/28-- patient on amiodarone  drip due to rapid a fib. Heartrate 97--110 --6/29--now on po amiodarone . Good uop   # Acute renal failure superimposed on stage 2 chronic kidney disease:  - s/p Lasix  and spironolactone  --creat 0.98   # Hypertension:  --Patient was on Coreg .--now d/ced by cardiology    # Type II diabetes mellitus with renal manifestations:  --Recent A1c 7.7, poorly controlled.  Patient is supposed to take Victoza and glargine insulin  60 units daily, but not taking medications currently.  Blood sugar 543 with normal anion gap 11 -- Hemoglobin A1c 12.8 uncontrolled - Start glargine insulin  45 units daily - SSI   # Depression with anxiety -As needed Ativan  --started Effexor  seen by psych   # COPD (chronic obstructive pulmonary disease): No wheezes -Bronchodilators as needed Mucinex    # Hyperlipidemia: on Crestor  and fenofibrate    # Stroke: Crestor  - Patient is on Eliquis  for A-fib   # Vitamin D  insufficiency: started vitamin D  50,000 units p.o. weekly, follow with PCP to repeat vitamin D   level after 3 to 6 months.   # Fall at home, initial encounter: CT -head negative for injury - Continue fall precaution - PT/OT--out pt rehab   # OSA: on CPAP   # Palliative care consulted Long-term prognosis is poor due to extensive cardiac history and depression.  As per palliative care, patient was made DNR/DNI. He wants to go home with hospice when he is medically optimized to do so.    Body mass index is 31.16 kg/m.  Interventions:  Procedures: Family communication :none today Consults :[facility] cards CODE STATUS: DNR DVT Prophylaxis : Level of care: ICU Status is: Inpatient Remains inpatient appropriate because: CHF and on Dobutamine  gtt    TOTAL TIME TAKING CARE OF THIS PATIENT: 35 minutes.  >50% time spent on  counselling and coordination of care  Note: This dictation was prepared with Dragon dictation along with smaller phrase technology. Any transcriptional errors that result from this process are unintentional.  Leita Blanch M.D    [facility]   CC: Primary care physician; Valora Agent, MD

## 2023-11-04 NOTE — Inpatient Diabetes Management (Signed)
 Inpatient Diabetes Program Recommendations  AACE/ADA: New Consensus Statement on Inpatient Glycemic Control (2015)  Target Ranges:  Prepandial:   less than 140 mg/dL      Peak postprandial:   less than 180 mg/dL (1-2 hours)      Critically ill patients:  140 - 180 mg/dL    Latest Reference Range & Units 11/03/23 07:51 11/03/23 11:17 11/03/23 16:44 11/03/23 21:12  Glucose-Capillary 70 - 99 mg/dL 817 (H)  2 units Novolog   30 units Semglee   338 (H)  7 units Novolog   299 (H)  5 units Novolog   285 (H)  3 units Novolog    (H): Data is abnormally high  Home DM Meds: Basaglar  60 units daily                              Victoza 1.2 mg Qweek??   Current Orders: Novolog  Sensitive Correction Scale/ SSI (0-9 units) TID AC + HS                            Semglee  35 units daily       MD- Note afternoon CBGs were elevated yest.  Note Semglee  increased to 35 units daily this AM (got 30 units yest)   May consider Starting Novolog  Meal Coverage:  Novolog  3 units TID with meals HOLD if pt NPO HOLD if pt eats <50% meals    Recommend restart Home Basaglar  at lower dose when pt goes home I don't think we need perfect CBG control at home given pt would like to pursue Hospice, however, we want to prevent consistent CBGs >300   Discharge Recommendations: Long acting recommendations: Insulin  Glargine (BASAGLAR ) Kwikpen 30 units daily  Supply/Referral recommendations: Pen needles - standard   Use Adult Diabetes Insulin  Treatment Post Discharge order set.     --Will follow patient during hospitalization--  Adina Rudolpho Arrow RN, MSN, CDCES Diabetes Coordinator Inpatient Glycemic Control Team Team Pager: [phone] (8a-5p)

## 2023-11-04 NOTE — Plan of Care (Signed)
  Problem: Education: Goal: Ability to describe self-care measures that may prevent or decrease complications (Diabetes Survival Skills Education) will improve Outcome: Progressing   Problem: Coping: Goal: Ability to adjust to condition or change in health will improve Outcome: Progressing   Problem: Fluid Volume: Goal: Ability to maintain a balanced intake and output will improve Outcome: Progressing   Problem: Health Behavior/Discharge Planning: Goal: Ability to identify and utilize available resources and services will improve Outcome: Progressing Goal: Ability to manage health-related needs will improve Outcome: Progressing   Problem: Metabolic: Goal: Ability to maintain appropriate glucose levels will improve Outcome: Progressing   Problem: Nutritional: Goal: Maintenance of adequate nutrition will improve Outcome: Progressing Goal: Progress toward achieving an optimal weight will improve Outcome: Progressing   Problem: Skin Integrity: Goal: Risk for impaired skin integrity will decrease Outcome: Progressing   Problem: Tissue Perfusion: Goal: Adequacy of tissue perfusion will improve Outcome: Progressing   Problem: Education: Goal: Knowledge of General Education information will improve Description: Including pain rating scale, medication(s)/side effects and non-pharmacologic comfort measures Outcome: Progressing   Problem: Health Behavior/Discharge Planning: Goal: Ability to manage health-related needs will improve Outcome: Progressing   Problem: Clinical Measurements: Goal: Ability to maintain clinical measurements within normal limits will improve Outcome: Progressing Goal: Will remain free from infection Outcome: Progressing Goal: Diagnostic test results will improve Outcome: Progressing Goal: Respiratory complications will improve Outcome: Progressing Goal: Cardiovascular complication will be avoided Outcome: Progressing   Problem: Activity: Goal:  Risk for activity intolerance will decrease Outcome: Progressing   Problem: Nutrition: Goal: Adequate nutrition will be maintained Outcome: Progressing   Problem: Coping: Goal: Level of anxiety will decrease Outcome: Progressing   Problem: Elimination: Goal: Will not experience complications related to bowel motility Outcome: Progressing Goal: Will not experience complications related to urinary retention Outcome: Progressing   Problem: Pain Managment: Goal: General experience of comfort will improve and/or be controlled Outcome: Progressing   Problem: Skin Integrity: Goal: Risk for impaired skin integrity will decrease Outcome: Progressing

## 2023-11-04 NOTE — Telephone Encounter (Signed)
 Patient Product/process development scientist completed.    The patient is insured through [employer]. Patient has [employer], may use a copay card, and/or apply for patient assistance if available.    Ran test claim for Eliquis  5 mg and the current 30 day co-pay is $100.94.  Ran test claim for Xarelto 20 mg and the current 30 day co-pay is $86.97.  Ran test claim for Entresto  24-26 mg and the current 30 day co-pay is $201.35.  Ran test claim for Farxiga  10 mg and the current 30 day co-pay is $38.08.  Ran test claim for Jardiance 10 mg and Requires Prior Authorization  This test claim was processed through [employer]- copay amounts may vary at other pharmacies due to [employer], or as the patient moves through the different stages of their insurance plan.     Reyes Sharps, CPHT Pharmacy Technician III Certified Patient Advocate [facility] Patient Advocate Team Direct Number: [phone]  Fax: [phone]

## 2023-11-04 NOTE — Progress Notes (Cosign Needed Addendum)
 Advanced Heart Failure Rounding Note  Cardiologist: None   Chief Complaint: Cardiogenic shock  Subjective:    CO-OX 73% on 5 DBA.  Continues on 160 lasix  IV BID. Weight down 19 lb. CVP 3.   Feeling well this am. No dyspnea at rest, orthopnea or PND.    Objective:   Weight Range: 102.8 kg Body mass index is 29.1 kg/m.   Vital Signs:   Temp:  [97.7 F (36.5 C)-98.4 F (36.9 C)] 97.8 F (36.6 C) (06/30 0415) Pulse Rate:  [28-132] 99 (06/30 0730) Resp:  [0-33] 22 (06/30 0730) BP: (106-168)/(50-146) 127/92 (06/30 0715) SpO2:  [88 %-100 %] 97 % (06/30 0730) Weight:  [102.8 kg] 102.8 kg (06/30 0415) Last BM Date : 11/03/23  Weight change: Filed Weights   11/02/23 0227 11/03/23 0400 11/04/23 0415  Weight: 106.9 kg 103.7 kg 102.8 kg    Intake/Output:   Intake/Output Summary (Last 24 hours) at 11/04/2023 0737 Last data filed at 11/04/2023 0732 Gross per 24 hour  Intake 698.94 ml  Output 3125 ml  Net -2426.06 ml      Physical Exam   General:  Chronically ill appearing Neck: JVP 6-7, L internal jugular CVC Cor: Irregular rhythm, tachy. No rubs, gallops or murmurs. Lungs: clear Abdomen: soft, nontender, nondistended.  Extremities: no edema Neuro: alert & orientedx3. Affect pleasant    Telemetry   Afib 110s-120s  Labs    CBC No results for input(s): WBC, NEUTROABS, HGB, HCT, MCV, PLT in the last 72 hours.  Basic Metabolic Panel Recent Labs    93/71/74 0332 11/03/23 0406 11/04/23 0417  NA 132* 134* 134*  K 3.7 3.7 3.8  CL 97* 93* 92*  CO2 28 30 30   GLUCOSE 167* 164* 162*  BUN 24* 20 22  CREATININE 0.89 1.00 1.07  CALCIUM  8.0* 8.2* 8.6*  MG 2.1  --  2.1  PHOS  --   --  2.6   Liver Function Tests Recent Labs    11/04/23 0417  ALBUMIN 3.1*   No results for input(s): LIPASE, AMYLASE in the last 72 hours. Cardiac Enzymes No results for input(s): CKTOTAL, CKMB, CKMBINDEX, TROPONINI in the last 72  hours.  BNP: BNP (last 3 results) Recent Labs    10/28/23 1649  BNP 987.7*    ProBNP (last 3 results) No results for input(s): PROBNP in the last 8760 hours.   D-Dimer No results for input(s): DDIMER in the last 72 hours. Hemoglobin A1C No results for input(s): HGBA1C in the last 72 hours.  Fasting Lipid Panel No results for input(s): CHOL, HDL, LDLCALC, TRIG, CHOLHDL, LDLDIRECT in the last 72 hours. Thyroid Function Tests No results for input(s): TSH, T4TOTAL, T3FREE, THYROIDAB in the last 72 hours.  Invalid input(s): FREET3   Other results:   Imaging    No results found.    Medications:     Scheduled Medications:  acetaminophen   500 mg Oral TID   amiodarone   200 mg Oral BID   apixaban   5 mg Oral BID   bisacodyl   10 mg Oral QHS   Chlorhexidine  Gluconate Cloth  6 each Topical Daily   docusate sodium   100 mg Oral BID   fenofibrate   160 mg Oral Daily   insulin  aspart  0-5 Units Subcutaneous QHS   insulin  aspart  0-9 Units Subcutaneous TID WC   insulin  glargine-yfgn  35 Units Subcutaneous Daily   polyethylene glycol  17 g Oral BID   potassium chloride   20 mEq Oral Once  potassium chloride   40 mEq Oral Daily   rOPINIRole   4 mg Oral QHS   rosuvastatin   5 mg Oral Daily   sodium chloride  flush  10-40 mL Intracatheter Q12H   venlafaxine  XR  75 mg Oral Q breakfast   Vitamin D  (Ergocalciferol )  50,000 Units Oral Q7 days    Infusions:  DOBUTamine  5 mcg/kg/min (11/04/23 0731)   furosemide  Stopped (11/03/23 1836)    PRN Medications: albuterol , bisacodyl , butalbital -acetaminophen -caffeine , ondansetron  (ZOFRAN ) IV, mouth rinse, sodium chloride  flush    Patient Profile   78 y.o. 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 Presenting with atrial fibrillation and cardiogenic shock.   Assessment/Plan   1. Acute on Chronic Systolic Heart  Failure>>Cardiogenic Shock - EF <20% since 2022.  - Etiology uncertain. NICM by cath. ?Tachy-mediated, however now with permanent AF. ?HTN CM in the setting of noncompliance.  - POCUS echo by CCM with EF ~10% - Continues on DBA 5 with CO-OX 73%. Wean DBA to 2.5, check CO-OX this afternoon. - CVP 3. Volume looks good on exam. Stop IV lasix . Start po Torsemide 20 mg daily tomorrow. - Start Enresto 24/26 mg BID for afterload reduction. BP elevated.  - He has severe biventricular dysfunction with LVEF <20%  so not VAD candidate. Suspect he is truly end-stage.  - Considering ome with hospice at discharge. Could consider home inotrope if fails DBA wean but he is not sure if he would want this. Now DNR/DNI.   2. Permanent Atrial Fibrillation - noncompliant with eliquis  at home, continue 5 mg eliquis  BID - Holding BB for now - Ventricular rate 110s-120s on po amiodarone  200 BID. Hopefully rate will improve with weaning inotrope support   3. AKI  - pre-renal: in the setting of hypoperfusion/ATN - Resolved with inotrope support   4. Transaminitis - in the setting of CGS and hepatic congestion - down trending   CRITICAL CARE Performed by: COLLETTA SHAVER Goodman   Total critical care time: 15 minutes  Critical care time was exclusive of separately billable procedures and treating other patients.  Critical care was necessary to treat or prevent imminent or life-threatening deterioration.  Critical care was time spent personally by me on the following activities: development of treatment plan with patient and/or surrogate as well as nursing, discussions with consultants, evaluation of patient's response to treatment, examination of patient, obtaining history from patient or surrogate, ordering and performing treatments and interventions, ordering and review of laboratory studies, ordering and review of radiographic studies, pulse oximetry and re-evaluation of patient's condition.   Length of Stay:  6  Paul Michel N, PA-C  11/04/2023, 7:37 AM

## 2023-11-05 DIAGNOSIS — I5023 Acute on chronic systolic (congestive) heart failure: Secondary | ICD-10-CM | POA: Diagnosis not present

## 2023-11-05 DIAGNOSIS — I4891 Unspecified atrial fibrillation: Secondary | ICD-10-CM | POA: Diagnosis not present

## 2023-11-05 LAB — BASIC METABOLIC PANEL WITH GFR
Anion gap: 9 (ref 5–15)
BUN: 24 mg/dL — ABNORMAL HIGH (ref 8–23)
CO2: 29 mmol/L (ref 22–32)
Calcium: 8.2 mg/dL — ABNORMAL LOW (ref 8.9–10.3)
Chloride: 98 mmol/L (ref 98–111)
Creatinine, Ser: 0.91 mg/dL (ref 0.61–1.24)
GFR, Estimated: >60 mL/min (ref >60)
Glucose, Bld: 154 mg/dL — ABNORMAL HIGH (ref 70–99)
Potassium: 3.6 mmol/L (ref 3.5–5.1)
Sodium: 136 mmol/L (ref 135–145)

## 2023-11-05 LAB — COOXEMETRY PANEL
Carboxyhemoglobin: 2.4 % — ABNORMAL HIGH (ref 0.5–1.5)
Methemoglobin: 0.9 % (ref 0.0–1.5)
O2 Saturation: 68.9 %
Total hemoglobin: 15.8 g/dL (ref 12.0–16.0)
Total oxygen content: 66.6 %

## 2023-11-05 LAB — GLUCOSE, CAPILLARY
Glucose-Capillary: 149 mg/dL — ABNORMAL HIGH (ref 70–99)
Glucose-Capillary: 254 mg/dL — ABNORMAL HIGH (ref 70–99)

## 2023-11-05 LAB — MAGNESIUM: Magnesium: 2.1 mg/dL (ref 1.7–2.4)

## 2023-11-05 LAB — PHOSPHORUS: Phosphorus: 2.4 mg/dL — ABNORMAL LOW (ref 2.5–4.6)

## 2023-11-05 MED ORDER — BASAGLAR KWIKPEN 100 UNIT/ML ~~LOC~~ SOPN: 35.0000 [IU] | PEN_INJECTOR | Freq: Every day | SUBCUTANEOUS | 0 refills | Status: AC

## 2023-11-05 MED ORDER — TORSEMIDE 20 MG PO TABS: 20.0000 mg | ORAL_TABLET | Freq: Every day | ORAL | Status: DC

## 2023-11-05 MED ORDER — APIXABAN 5 MG PO TABS: 5.0000 mg | ORAL_TABLET | Freq: Two times a day (BID) | ORAL | 3 refills | Status: AC

## 2023-11-05 MED ORDER — AMIODARONE HCL 200 MG PO TABS: 200.0000 mg | ORAL_TABLET | Freq: Two times a day (BID) | ORAL | 1 refills | Status: AC

## 2023-11-05 MED ORDER — K PHOS MONO-SOD PHOS DI & MONO 155-852-130 MG PO TABS
500.0000 mg | ORAL_TABLET | ORAL | Status: AC
  Administered 2023-11-05 (×2): 500 mg via ORAL
  Filled 2023-11-05 (×2): qty 2

## 2023-11-05 MED ORDER — SPIRONOLACTONE 25 MG PO TABS
12.5000 mg | ORAL_TABLET | Freq: Every day | ORAL | 2 refills | Status: AC
Start: 2023-11-05 — End: ?

## 2023-11-05 MED ORDER — VENLAFAXINE HCL ER 75 MG PO CP24: 75.0000 mg | ORAL_CAPSULE | Freq: Every day | ORAL | 1 refills | Status: AC

## 2023-11-05 MED ORDER — VITAMIN D (ERGOCALCIFEROL) 1.25 MG (50000 UNIT) PO CAPS: 50000.0000 [IU] | ORAL_CAPSULE | ORAL | 0 refills | Status: AC

## 2023-11-05 MED ORDER — FENOFIBRATE 160 MG PO TABS: 160.0000 mg | ORAL_TABLET | Freq: Every day | ORAL | 1 refills | Status: AC

## 2023-11-05 MED ORDER — TORSEMIDE 20 MG PO TABS
20.0000 mg | ORAL_TABLET | Freq: Every day | ORAL | 1 refills | Status: AC
Start: 2023-11-06 — End: ?

## 2023-11-05 MED ORDER — ROSUVASTATIN CALCIUM 5 MG PO TABS: 5.0000 mg | ORAL_TABLET | Freq: Every day | ORAL | 1 refills | Status: AC

## 2023-11-05 MED ORDER — LOSARTAN POTASSIUM 25 MG PO TABS
25.0000 mg | ORAL_TABLET | Freq: Every day | ORAL | 1 refills | Status: AC
Start: 2023-11-05 — End: ?

## 2023-11-05 NOTE — Discharge Summary (Signed)
 Physician Discharge Summary   Patient: Paul Goodman MRN: [id number] DOB: 01/23/1946  Admit date:     10/28/2023  Discharge date: 11/05/23  Discharge Physician: Leita Blanch   PCP: Valora Agent, MD   Recommendations at discharge:   follow-up PCP in 1 to 2 week follow-up [facility] cardiology Dr. 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 male with medical history significant of HTN HLD, DM, COPD, sCHF with EF < 20%, stroke, CKD 32, BPH, A-fib on Eliquis , OSA on CPAP, obesity, depression with anxiety, who presents with abnormal lab with hyperglycemia and hyperkalemia.    Acute on chronic systolic congestive heart failure/cardiogenic shock Atrial fibrillation with RVR: HR is 110-140s.  Possibly due to medication noncompliance. -- Continue Eliquis   --TSH level wnl --6/26 Levophed  weaned off, currently on dobutamine  -- advance CHF team cardiology consulted --6/27--increased lasix  to 160 mg bid --EF <20% --cont Dobutamine  gtt over the weekend --6/28-- patient on amiodarone  drip due to rapid a fib. Heartrate 97--110 --6/29--now on po amiodarone . Good uop --6/30--dobutamine  decreased to 2.5 mcg --7/1--off dobutamine . Ok with Cardiology to go home with hospice   # Acute renal failure superimposed on stage 2 chronic kidney disease:  --s/p Lasix  and spironolactone  --creat 0.98 --now on Torsemide   # Hypertension:   --pt on losartan    # Type II diabetes mellitus with renal manifestations:  --Recent A1c 7.7, poorly controlled.  Patient is supposed to take Victoza and glargine insulin  60 units daily, but not taking medications currently.  Blood sugar 543 with normal anion gap 11 -- Hemoglobin A1c 12.8 uncontrolled - Start glargine insulin  35 units daily--adjust at home according to sugars - SSI   # Depression with anxiety -As needed Ativan  --started Effexor  seen by psych   # COPD (chronic obstructive pulmonary disease): No wheezes -Bronchodilators as needed Mucinex    # Hyperlipidemia: on Crestor  and fenofibrate    # Stroke: Crestor  - Patient is on Eliquis  for A-fib   # Vitamin D  insufficiency: started vitamin D  50,000 units p.o. weekly, follow with PCP to repeat vitamin D  level after 3 to 6 months.   # Fall at home, initial encounter: CT -head negative for injury - Continue fall precaution - PT/OT--out pt rehab   # OSA: on CPAP   # Palliative care consulted Long-term prognosis is poor due to extensive cardiac history and depression.  As per palliative care, patient was made DNR/DNI. He wants to go home with hospice when he is medically optimized to do so.    Body mass index is 31.16 kg/m.  Interventions:   Procedures: Family communication :wife at bedside Consults :[facility] cards CODE STATUS: DNR  Long-term prognosis poor. Patient will go home with hospice to follow.     Disposition: Homewith hospice Diet recommendation:  Discharge Diet Orders (From admission, onward)     Start     Ordered   11/05/23 0000  Diet -  low sodium heart healthy        11/05/23 1206           Cardiac and Carb modified diet DISCHARGE MEDICATION: Allergies as of 11/05/2023   No Known Allergies      Medication List     STOP taking these medications    amLODipine 10 MG tablet Commonly known as: NORVASC   carvedilol  25 MG tablet Commonly known as: COREG    CHROMIUM PICOLATE PO    dextromethorphan -guaiFENesin  30-600 MG 12hr tablet Commonly known as: MUCINEX  DM   furosemide  20 MG tablet Commonly known as: LASIX    hydrochlorothiazide  25 MG tablet Commonly known as: HYDRODIURIL    liraglutide 18 MG/3ML Sopn Commonly known as: VICTOZA   LORazepam  1 MG tablet Commonly known as: ATIVAN    PARoxetine  20 MG tablet Commonly known as: PAXIL    pramipexole 0.125 MG tablet Commonly known as: MIRAPEX   ZINC  15 PO       TAKE these medications    albuterol  108 (90 Base) MCG/ACT inhaler Commonly known as: VENTOLIN  HFA Inhale 2 puffs into the lungs every 6 (six) hours as needed for shortness of breath.   amiodarone  200 MG tablet Commonly known as: PACERONE  Take 1 tablet (200 mg total) by mouth 2 (two) times daily.   apixaban  5 MG Tabs tablet Commonly known as: ELIQUIS  Take 1 tablet (5 mg total) by mouth 2 (two) times daily.   Basaglar  KwikPen 100 UNIT/ML Inject 35 Units into the skin daily. What changed: how much to take   fenofibrate  160 MG tablet Take 1 tablet (160 mg total) by mouth daily.   losartan  25 MG tablet Commonly known as: COZAAR  Take 1 tablet (25 mg total) by mouth daily. What changed:  medication strength how much to take   rOPINIRole  4 MG 24 hr tablet Commonly known as: REQUIP  XL Take 12-15 mg by mouth at bedtime.   rosuvastatin  5 MG tablet Commonly known as: CRESTOR  Take 1 tablet (5 mg total) by mouth daily.   spironolactone  25 MG tablet Commonly known as: ALDACTONE  Take 0.5 tablets (12.5 mg total) by mouth daily.   torsemide 20 MG tablet Commonly known as: DEMADEX Take 1 tablet (20 mg total) by mouth daily. Start taking on: November 06, 2023   VANADIUM PO Take by mouth.   venlafaxine  XR 75 MG 24 hr capsule Commonly known as: EFFEXOR -XR Take 1 capsule (75 mg total) by mouth daily with breakfast. Start taking on: November 06, 2023   Vitamin D  (Ergocalciferol ) 1.25 MG (50000 UNIT) Caps capsule Commonly known as: DRISDOL  Take 1  capsule (50,000 Units total) by mouth every 7 (seven) days.        Follow-up Information     Tobie Nanci Baptise, MD. Go on 11/26/2023.   Specialty: Cardiology Why: At [facility] clinic office at 1030am Contact information: [address] [facility] [address] [facility] [address] [place] [address] [phone]         Florencio Cara BIRCH, MD. Go on 11/14/2023.   Specialties: Cardiology, Internal Medicine Why: 0930am Contact information: [address] [place] [address] [phone]         Valora Agent, MD. Go on 11/27/2023.   Specialty: Family Medicine Why: AT: 3;30pM Contact information: [address] [place] [address] [phone]                Discharge Exam: Fredricka Weights   11/03/23 0400 11/04/23 0415 11/05/23 0345  Weight: 103.7 kg 102.8 kg 103 kg  GENERAL:  78 y.o.-year-old patient with no acute distress. Appears weak and chronically ill LUNGS: decreased breath sounds bilaterally, no wheezing CARDIOVASCULAR: S1, S2 normal. No murmur   ABDOMEN: Soft, nontender, nondistended. Bowel sounds present.  EXTREMITIES: + edema b/l.    NEUROLOGIC: nonfocal  patient is alert and awake   Condition at discharge: fair  The results of significant diagnostics from this hospitalization (including imaging, microbiology, ancillary and laboratory) are listed below for reference.   Imaging Studies: US  Abdomen Limited RUQ (LIVER/GB) Result Date: 10/30/2023 CLINICAL DATA:  Elevated LFTs. EXAM: ULTRASOUND ABDOMEN LIMITED RIGHT UPPER QUADRANT COMPARISON:  None available FINDINGS: Gallbladder: No gallstones or wall thickening visualized. No sonographic Murphy sign noted by sonographer. Common bile duct: Diameter: 5 mm Liver: There is diffuse increased liver echogenicity most commonly seen in the setting of fatty infiltration. Superimposed inflammation or fibrosis is not excluded. Clinical correlation is recommended. Portal vein is patent on color  Doppler imaging with normal direction of blood flow towards the liver. Other: None. IMPRESSION: Fatty liver, otherwise unremarkable right upper quadrant ultrasound. Electronically Signed   By: Vanetta Chou M.D.   On: 10/30/2023 16:18   DG Chest Port 1 View Result Date: 10/30/2023 CLINICAL DATA:  Central line placement. EXAM: PORTABLE CHEST 1 VIEW COMPARISON:  October 29, 2023 FINDINGS: Since the prior study there is been interval placement of a left-sided internal jugular venous catheter. Its distal tip is seen at the level of the right hilum within the distal portion of the superior vena cava. The cardiac silhouette is mildly enlarged and unchanged in size. Low lung volumes are noted. Stable prominence of the pulmonary vasculature is seen with associated increased interstitial lung markings. Mild to moderate severity areas of atelectasis and/or infiltrate are present within the bilateral lung bases. No pleural effusion or pneumothorax is identified. Multilevel degenerative changes are seen throughout the thoracic spine. IMPRESSION: 1. Interval left-sided internal jugular venous catheter placement and positioning, as described above, without evidence of pneumothorax. 2. Stable cardiomegaly with mild to moderate severity pulmonary vascular congestion and pulmonary edema. 3. Mild to moderate severity bibasilar atelectasis and/or infiltrate. Electronically Signed   By: Suzen Dials M.D.   On: 10/30/2023 12:30   DG Chest Port 1 View Result Date: 10/29/2023 CLINICAL DATA:  Congestive heart failure. EXAM: PORTABLE CHEST 1 VIEW COMPARISON:  February 09, 2023. FINDINGS: Mild cardiomegaly is noted with mild central pulmonary vascular congestion. Bibasilar pulmonary edema is noted. Bony thorax is unremarkable. IMPRESSION: Mild cardiomegaly with mild central pulmonary vascular congestion and bilateral pulmonary edema. Electronically Signed   By: Lynwood Landy Raddle M.D.   On: 10/29/2023 16:30   CT HEAD WO CONTRAST  ( ) Result Date: 10/28/2023 CLINICAL DATA:  Recent syncopal episode EXAM: CT HEAD WITHOUT CONTRAST TECHNIQUE: Contiguous axial images were obtained from the base of the skull through the vertex without intravenous contrast. RADIATION DOSE REDUCTION: This exam was performed according to the departmental dose-optimization program which includes automated exposure control, adjustment of the mA and/or kV according to patient size and/or use of iterative reconstruction technique. COMPARISON:  02/09/2023 FINDINGS: Brain: No evidence of acute infarction, hemorrhage, hydrocephalus, extra-axial collection or mass lesion/mass effect. Remote right frontoparietal infarct is again seen and stable. Vascular calcifications are noted in the centrum semi ovale stable from the prior exam. Basal ganglia calcifications are seen as well. Vascular: No hyperdense vessel or unexpected calcification. Skull: Normal. Negative for fracture or focal lesion. Sinuses/Orbits: No acute finding. Other: None. IMPRESSION: Chronic right frontoparietal infarct.  No acute abnormality noted. Electronically Signed   By: Oneil Devonshire M.D.   On: 10/28/2023 19:47    Microbiology: Results for orders placed or performed during the hospital encounter of 10/28/23  MRSA Next Gen by PCR, Nasal     Status: Abnormal   Collection Time: 10/30/23 10:27 AM   Specimen: Nasal Mucosa; Nasal Swab  Result Value Ref Range Status   MRSA by PCR Next Gen DETECTED (A) NOT DETECTED Final    Comment: RESULT CALLED TO, READ BACK BY AND VERIFIED WITH: LUKE PRIMER 10/30/23 1307 MW (NOTE) The GeneXpert MRSA Assay (FDA approved for NASAL specimens only), is one component of a comprehensive MRSA colonization surveillance program. It is not intended to diagnose MRSA infection nor to guide or monitor treatment for MRSA infections. Test performance is not FDA approved in patients less than 58 years old. Performed at [facility], [address]  [place], [place] [address]     Labs: CBC: Recent Labs  Lab 10/30/23 0244 10/31/23 0354 11/01/23 0302  WBC 11.6* 10.1 8.7  HGB 14.3 13.4 13.2  HCT 42.9 40.3 40.1  MCV 87.4 87.8 87.9  PLT 234 171 181   Basic Metabolic Panel: Recent Labs  Lab 10/30/23 0244 10/30/23 0957 10/31/23 0354 11/01/23 0302 11/02/23 0332 11/03/23 0406 11/04/23 0417 11/05/23 0338  NA 130*   < > 136 137 132* 134* 134* 136  K 4.3   < > 3.6 3.7 3.7 3.7 3.8 3.6  CL 100   < > 102 101 97* 93* 92* 98  CO2 21*   < > 27 30 28 30 30 29   GLUCOSE 192*   < > 137* 133* 167* 164* 162* 154*  BUN 39*   < > 39* 31* 24* 20 22 24*  CREATININE 1.49*   < > 1.28* 0.98 0.89 1.00 1.07 0.91  CALCIUM  8.3*   < > 7.6* 7.9* 8.0* 8.2* 8.6* 8.2*  MG 2.9*  --  2.3 2.3 2.1  --  2.1 2.1  PHOS 5.9*  --  4.3 2.5  --   --  2.6 2.4*   < > = values in this interval not displayed.   Liver Function Tests: Recent Labs  Lab 10/30/23 0957 10/30/23 1227 10/31/23 0354 11/04/23 0417  AST 49* 55* 41  --   ALT 61* 65* 51*  --   ALKPHOS 136* 152* 120  --   BILITOT 0.5 1.0 1.2  --   PROT 6.2* 6.6 5.7*  --   ALBUMIN 3.2* 3.4* 2.8* 3.1*   CBG: Recent Labs  Lab 11/04/23 1135 11/04/23 1635 11/04/23 2100 11/05/23 0729 11/05/23 1132  GLUCAP 304* 243* 336* 149* 254*    Discharge time spent: greater than 30 minutes.  Signed: Leita Blanch, MD [facility] 11/05/2023

## 2023-11-05 NOTE — Progress Notes (Addendum)
 Advanced Heart Failure Rounding Note  Cardiologist: None   Chief Complaint: Cardiogenic shock  Subjective:    DBA weaned off yesterday but added back at 2.5 mcg/kg/min yesterday evening.  CO-OX 69% this am.   CVP 3.   Feeling well. No dyspnea at rest. Was up to chair most of yesterday. Wants to go home.    Objective:   Weight Range: 103 kg Body mass index is 29.15 kg/m.   Vital Signs:   Temp:  [97.8 F (36.6 C)-98.3 F (36.8 C)] 98.3 F (36.8 C) (07/01 0345) Pulse Rate:  [28-148] 53 (07/01 0645) Resp:  [0-27] 18 (07/01 0645) BP: (92-164)/(41-128) 135/81 (07/01 0645) SpO2:  [87 %-99 %] 95 % (07/01 0645) Weight:  [103 kg] 103 kg (07/01 0345) Last BM Date : 11/04/23  Weight change: Filed Weights   11/03/23 0400 11/04/23 0415 11/05/23 0345  Weight: 103.7 kg 102.8 kg 103 kg    Intake/Output:   Intake/Output Summary (Last 24 hours) at 11/05/2023 0715 Last data filed at 11/05/2023 0545 Gross per 24 hour  Intake 423.28 ml  Output 1225 ml  Net -801.72 ml      Physical Exam   General:  Chronically ill appearing elderly male Neck:  Cor: Irregularly irregular rhythm. No rubs, gallops or murmurs. Lungs: clear Abdomen: soft, nontender, nondistended.  Extremities: no edema Neuro: alert & orientedx3. Affect pleasant     Telemetry   Afib 90s-110s, occasional PVCs  Labs    CBC No results for input(s): WBC, NEUTROABS, HGB, HCT, MCV, PLT in the last 72 hours.  Basic Metabolic Panel Recent Labs    93/69/74 0417 11/05/23 0338  NA 134* 136  K 3.8 3.6  CL 92* 98  CO2 30 29  GLUCOSE 162* 154*  BUN 22 24*  CREATININE 1.07 0.91  CALCIUM  8.6* 8.2*  MG 2.1 2.1  PHOS 2.6 2.4*   Liver Function Tests Recent Labs    11/04/23 0417  ALBUMIN 3.1*   No results for input(s): LIPASE, AMYLASE in the last 72 hours. Cardiac Enzymes No results for input(s): CKTOTAL, CKMB, CKMBINDEX, TROPONINI in the last 72 hours.  BNP: BNP (last 3  results) Recent Labs    10/28/23 1649  BNP 987.7*    ProBNP (last 3 results) No results for input(s): PROBNP in the last 8760 hours.   D-Dimer No results for input(s): DDIMER in the last 72 hours. Hemoglobin A1C No results for input(s): HGBA1C in the last 72 hours.  Fasting Lipid Panel No results for input(s): CHOL, HDL, LDLCALC, TRIG, CHOLHDL, LDLDIRECT in the last 72 hours. Thyroid Function Tests No results for input(s): TSH, T4TOTAL, T3FREE, THYROIDAB in the last 72 hours.  Invalid input(s): FREET3   Other results:   Imaging    No results found.    Medications:     Scheduled Medications:  acetaminophen   500 mg Oral TID   amiodarone   200 mg Oral BID   apixaban   5 mg Oral BID   bisacodyl   10 mg Oral QHS   Chlorhexidine  Gluconate Cloth  6 each Topical Daily   docusate sodium   100 mg Oral BID   feeding supplement  237 mL Oral BID BM   fenofibrate   160 mg Oral Daily   insulin  aspart  0-5 Units Subcutaneous QHS   insulin  aspart  0-9 Units Subcutaneous TID WC   insulin  glargine-yfgn  35 Units Subcutaneous Daily   losartan   25 mg Oral Daily   polyethylene glycol  17 g Oral BID  potassium chloride   40 mEq Oral Daily   rOPINIRole   4 mg Oral QHS   rosuvastatin   5 mg Oral Daily   sodium chloride  flush  10-40 mL Intracatheter Q12H   spironolactone   12.5 mg Oral Daily   torsemide  20 mg Oral Daily   venlafaxine  XR  75 mg Oral Q breakfast   Vitamin D  (Ergocalciferol )  50,000 Units Oral Q7 days    Infusions:  DOBUTamine  2.5 mcg/kg/min (11/05/23 0545)    PRN Medications: albuterol , bisacodyl , butalbital -acetaminophen -caffeine , ondansetron  (ZOFRAN ) IV, mouth rinse, sodium chloride  flush    Patient Profile   78 y.o. 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 Presenting with atrial fibrillation and cardiogenic shock.   Assessment/Plan   1. Acute on  Chronic Systolic Heart Failure>>Cardiogenic Shock - EF <20% since 2022.  - Etiology uncertain. NICM by cath. ?Tachy-mediated, however now with permanent AF. ?HTN CM in the setting of noncompliance.  - POCUS echo by CCM with EF ~10% - CO-OX 69% on 2.5 DBA. Discontinue DBA now. No plans for discharge home with inotrope support. - CVP 3. Volume looks good on exam. Start 20 mg Torsemide daily tomorrow - Start spiro 12.5 mg daily - Start losartan  25 mg daily (stopped entresto  d/t cost) - He has severe biventricular dysfunction with LVEF <20%  so not VAD candidate. Suspect he is truly end-stage.  - Planning for home with hospice at discharge. Now DNR/DNI.   2. Permanent Atrial Fibrillation - noncompliant with eliquis  at home, continue 5 mg eliquis  BID - Holding BB for now - Ventricular rates 90s-110s this am. Rate control may further improve off inotrope support. Continue amiodarone  200 mg BID.   3. AKI  - pre-renal: in the setting of hypoperfusion/ATN - Resolved with inotrope support   4. Transaminitis - in the setting of CGS and hepatic congestion - improved   Okay for discharge from HF standpoint. Discussed with Dr. Zenaida.   He will follow-up in outpatient setting with [facility].   HF medications: Amiodarone  200 mg BID Eliquis  5 mg BID Losartan  25 mg daily Spironolactone  12.5 mg daily Torsemide 20 mg daily, starting 07/02 Potassium chloride  40 mEq daily   CRITICAL CARE Performed by: COLLETTA SHAVER N   Total critical care time: 18 minutes  Critical care time was exclusive of separately billable procedures and treating other patients.  Critical care was necessary to treat or prevent imminent or life-threatening deterioration.  Critical care was time spent personally by me on the following activities: development of treatment plan with patient and/or surrogate as well as nursing, discussions with consultants, evaluation of patient's response to treatment, examination of  patient, obtaining history from patient or surrogate, ordering and performing treatments and interventions, ordering and review of laboratory studies, ordering and review of radiographic studies, pulse oximetry and re-evaluation of patient's condition.   Length of Stay: 7  Cesare Sumlin N, PA-C  11/05/2023, 7:15 AM

## 2023-11-05 NOTE — Progress Notes (Signed)
 [facility] CLINIC CARDIOLOGY PROGRESS NOTE       Patient ID: Paul Goodman MRN: [id number] DOB/AGE: 1945/05/13 78 y.o.  Admit date: 10/28/2023 Referring Physician Dr. Von Primary Physician Valora Agent, MD Primary Cardiologist Dr. Marshia Blanch Reason for Consultation AF RVR  HPI: Paul Goodman is a 78 y.o. male  with a past medical history of permanent atrial fibrillation (on Eliquis ), chronic HFrEF (EF < 20%), nonischemic cardiomyopathy, pulmonary hypertension, hyperlipidemia, hypertension, history CVA, diabetes mellitus, OSA (on CPAP), obesity who presented to the ED on 10/28/2023 sent by pulmonology due to extremely elevated blood glucose and hyperkalemia.  Upon admission EKG revealed atrial fibrillation RVR with rates in the 140s. Cardiology was consulted for further evaluation.   Interval History: -Patient seen and examined this AM and sitting up in hospital bed. Patient states he feels good and denies chest pain or SOB.  Patient continues to be in good spirits. -Heart rate elevated.  Per telemetry remains in atrial fibrillation with rates 90-100s. -Patient weaned off dobutamine  yesterday, then put back on yesterday evening, now weaned off today 07/01 at 0700. BP remains stable.  -UOP 1.225L yesterday with stable renal function.  -Patient remains on room air with stable SpO2.  -Patient eager to go home. -Advanced heart failure team following.  Pertinent Cardiac History (Most recent) RHC/LHC (04/29/2023)   There is severe left ventricular systolic dysfunction.   LV end diastolic pressure is mildly elevated.   The left ventricular ejection fraction is less than 25% by visual estimate.   Hemodynamic findings consistent with pulmonary hypertension.   No indication for antiplatelet therapy at this time .  Right heart -Wedge mean of 20 -PA mean 24   Left ventriculogram -Depressed left ventricular function globally left ventricular enlargement EF of around 25%    Coronaries  -Normal coronaries left dominant system  -Study consistent with a nonischemic cardiomyopathy  Review of systems complete and found to be negative unless listed above    Past Medical History:  Diagnosis Date   Anxiety    Arthritis    CHF (congestive heart failure) (HCC)    COPD (chronic obstructive pulmonary disease) (HCC)    Diabetes (HCC)    ED (erectile dysfunction)    Heart murmur    Hyperlipidemia    Hypertension    Melanoma (HCC)    Sleep apnea     Past Surgical History:  Procedure Laterality Date   EYE SURGERY Right    cancer   HAND SURGERY Left 1966   KNEE SURGERY Left 1973   RIGHT/LEFT HEART CATH AND CORONARY ANGIOGRAPHY Bilateral 04/29/2023   Procedure: RIGHT/LEFT HEART CATH AND CORONARY ANGIOGRAPHY;  Surgeon: Florencio Cara BIRCH, MD;  Location: [facility] INVASIVE CV LAB;  Service: Cardiovascular;  Laterality: Bilateral;   TONSILLECTOMY  1964    Medications Prior to Admission  Medication Sig Dispense Refill Last Dose/Taking   rOPINIRole  (REQUIP  XL) 4 MG 24 hr tablet Take 12-15 mg by mouth at bedtime.   Past Week   albuterol  (PROVENTIL  HFA;VENTOLIN  HFA) 108 (90 BASE) MCG/ACT inhaler Inhale 2 puffs into the lungs every 6 (six) hours as needed for shortness of breath.      amiodarone  (PACERONE ) 200 MG tablet Take 1 tablet (200 mg total) by mouth 2 (two) times daily. (Patient not taking: Reported on 10/29/2023) 60 tablet 2 Not Taking   amLODipine (NORVASC) 10 MG tablet Take 10 mg by mouth daily. (Patient not taking: Reported on 10/29/2023)   Not Taking   apixaban  (ELIQUIS ) 5 MG  TABS tablet Take 1 tablet (5 mg total) by mouth 2 (two) times daily. (Patient not taking: Reported on 10/29/2023) 60 tablet 3 Not Taking   carvedilol  (COREG ) 25 MG tablet Take 1 tablet (25 mg total) by mouth 2 (two) times daily with a meal. (Patient not taking: Reported on 10/29/2023) 60 tablet 3 Not Taking   Chromium Picolinate (CHROMIUM PICOLATE PO) Take by mouth. (Patient not taking: Reported  on 10/29/2023)   Not Taking   dextromethorphan -guaiFENesin  (MUCINEX  DM) 30-600 MG 12hr tablet Take 1 tablet by mouth every evening.      fenofibrate  160 MG tablet Take 160 mg by mouth daily. (Patient not taking: Reported on 10/29/2023)   Not Taking   furosemide  (LASIX ) 20 MG tablet Take 20 mg by mouth daily as needed. (Patient not taking: Reported on 10/29/2023)   Not Taking   hydrochlorothiazide  (HYDRODIURIL ) 25 MG tablet Take 25 mg by mouth daily. (Patient not taking: Reported on 10/29/2023)   Not Taking   Insulin  Glargine (BASAGLAR  KWIKPEN) 100 UNIT/ML Inject 60 Units into the skin daily. (Patient not taking: Reported on 10/29/2023)   Not Taking   liraglutide (VICTOZA) 18 MG/3ML SOPN Inject 1.2 mg into the skin once a week. (Patient not taking: Reported on 10/29/2023)   Not Taking   LORazepam  (ATIVAN ) 1 MG tablet Take 0.5-1 mg by mouth daily as needed. TAKE ONE-HALF (1/2) TO ONE (1) TABLET BY MOUTH DAILY AS NEEDED FOR ANXIETY, PANIC, OR THROAT SWELLING (Patient not taking: Reported on 04/29/2023)      losartan  (COZAAR ) 100 MG tablet Take 100 mg by mouth daily. (Patient not taking: Reported on 10/29/2023)   Not Taking   PARoxetine  (PAXIL ) 20 MG tablet Take 20 mg by mouth daily. (Patient not taking: Reported on 10/29/2023)   Not Taking   pramipexole (MIRAPEX) 0.125 MG tablet TAKE 1 TABLET BY MOUTH 2 HOURS BEFORE BEDTIME, MAY TITRATE BY 1 TABLET PER WEEK AS NEEDED MAXIMUM 4 TABLETS. (Patient not taking: Reported on 04/29/2023)   Not Taking   rosuvastatin  (CRESTOR ) 5 MG tablet Take 5 mg by mouth daily. (Patient not taking: Reported on 10/29/2023)   Not Taking   spironolactone  (ALDACTONE ) 25 MG tablet Take 0.5 tablets (12.5 mg total) by mouth daily. (Patient not taking: Reported on 10/29/2023) 30 tablet 3 Not Taking   VANADIUM PO Take by mouth.      Zinc  Sulfate (ZINC  15 PO) Take by mouth. (Patient not taking: Reported on 10/29/2023)   Not Taking   Social History   Socioeconomic History   Marital status:  Married    Spouse name: Not on file   Number of children: Not on file   Years of education: Not on file   Highest education level: Not on file  Occupational History   Not on file  Tobacco Use   Smoking status: Former    Types: Cigarettes   Smokeless tobacco: Former    Types: Chew   Tobacco comments:    quit 1992  Vaping Use   Vaping status: Never Used  Substance and Sexual Activity   Alcohol use: Not Currently    Alcohol/week: 14.0 standard drinks of alcohol    Types: 14 Standard drinks or equivalent per week   Drug use: Not Currently    Types: Marijuana   Sexual activity: Not Currently  Other Topics Concern   Not on file  Social History Narrative   Not on file   Social Drivers of Health   Financial Resource Strain: Medium Risk (09/17/2023)  Received from [facility]   Overall Financial Resource Strain (CARDIA)    Difficulty of Paying Living Expenses: Somewhat hard  Food Insecurity: Food Insecurity Present (10/28/2023)   Hunger Vital Sign    Worried About Running Out of Food in the Last Year: Sometimes true    Ran Out of Food in the Last Year: Sometimes true  Transportation Needs: No Transportation Needs (10/28/2023)   PRAPARE - Administrator, Civil Service (Medical): No    Lack of Transportation (Non-Medical): No  Physical Activity: Not on file  Stress: Not on file  Social Connections: Socially Integrated (10/28/2023)   Social Connection and Isolation Panel    Frequency of Communication with Friends and Family: Three times a week    Frequency of Social Gatherings with Friends and Family: Three times a week    Attends Religious Services: 1 to 4 times per year    Active Member of Clubs or Organizations: Yes    Attends Banker Meetings: 1 to 4 times per year    Marital Status: Married  Catering manager Violence: Not At Risk (10/28/2023)   Humiliation, Afraid, Rape, and Kick questionnaire    Fear of Current or Ex-Partner: No     Emotionally Abused: No    Physically Abused: No    Sexually Abused: No    Family History  Problem Relation Age of Onset   Kidney Stones Father    Kidney disease Neg Hx    Prostate cancer Neg Hx    Kidney cancer Neg Hx    Bladder Cancer Neg Hx      Vitals:   11/05/23 0645 11/05/23 0700 11/05/23 0900 11/05/23 1000  BP: 135/81 138/67 129/72 (!) 120/99  Pulse: (!) 53 (!) 42 (!) 115 100  Resp: 18 11 19 20   Temp:      TempSrc:      SpO2: 95% 95% 94% 97%  Weight:      Height:        PHYSICAL EXAM General: Chronically ill appearing elderly male, well nourished, in no acute distress. HEENT: Normocephalic and atraumatic. Neck: No JVD.   Lungs: Normal respiratory effort on room air. CTAB Heart: Irregularly irregular, controlled rate. Normal S1 and S2 without gallops or murmurs.  Abdomen: Non-distended appearing.  Msk: Normal strength and tone for age. Extremities: Warm and well perfused.  No cyanosis, clubbing, edema   Labs: Basic Metabolic Panel: Recent Labs    11/04/23 0417 11/05/23 0338  NA 134* 136  K 3.8 3.6  CL 92* 98  CO2 30 29  GLUCOSE 162* 154*  BUN 22 24*  CREATININE 1.07 0.91  CALCIUM  8.6* 8.2*  MG 2.1 2.1  PHOS 2.6 2.4*   Liver Function Tests: Recent Labs    11/04/23 0417  ALBUMIN 3.1*   No results for input(s): LIPASE, AMYLASE in the last 72 hours. CBC: No results for input(s): WBC, NEUTROABS, HGB, HCT, MCV, PLT in the last 72 hours.  Cardiac Enzymes: No results for input(s): CKTOTAL, CKMB, CKMBINDEX, TROPONINIHS in the last 72 hours. BNP: No results for input(s): BNP in the last 72 hours.  D-Dimer: No results for input(s): DDIMER in the last 72 hours. Hemoglobin A1C: No results for input(s): HGBA1C in the last 72 hours.  Fasting Lipid Panel: No results for input(s): CHOL, HDL, LDLCALC, TRIG, CHOLHDL, LDLDIRECT in the last 72 hours. Thyroid Function Tests: No results for input(s): TSH,  T4TOTAL, T3FREE, THYROIDAB in the last 72 hours.  Invalid input(s):  FREET3  Anemia Panel: No results for input(s): VITAMINB12, FOLATE, FERRITIN, TIBC, IRON, RETICCTPCT in the last 72 hours.    Radiology: US  Abdomen Limited RUQ (LIVER/GB) Result Date: 10/30/2023 CLINICAL DATA:  Elevated LFTs. EXAM: ULTRASOUND ABDOMEN LIMITED RIGHT UPPER QUADRANT COMPARISON:  None available FINDINGS: Gallbladder: No gallstones or wall thickening visualized. No sonographic Murphy sign noted by sonographer. Common bile duct: Diameter: 5 mm Liver: There is diffuse increased liver echogenicity most commonly seen in the setting of fatty infiltration. Superimposed inflammation or fibrosis is not excluded. Clinical correlation is recommended. Portal vein is patent on color Doppler imaging with normal direction of blood flow towards the liver. Other: None. IMPRESSION: Fatty liver, otherwise unremarkable right upper quadrant ultrasound. Electronically Signed   By: Vanetta Chou M.D.   On: 10/30/2023 16:18   DG Chest Port 1 View Result Date: 10/30/2023 CLINICAL DATA:  Central line placement. EXAM: PORTABLE CHEST 1 VIEW COMPARISON:  October 29, 2023 FINDINGS: Since the prior study there is been interval placement of a left-sided internal jugular venous catheter. Its distal tip is seen at the level of the right hilum within the distal portion of the superior vena cava. The cardiac silhouette is mildly enlarged and unchanged in size. Low lung volumes are noted. Stable prominence of the pulmonary vasculature is seen with associated increased interstitial lung markings. Mild to moderate severity areas of atelectasis and/or infiltrate are present within the bilateral lung bases. No pleural effusion or pneumothorax is identified. Multilevel degenerative changes are seen throughout the thoracic spine. IMPRESSION: 1. Interval left-sided internal jugular venous catheter placement and positioning, as described above,  without evidence of pneumothorax. 2. Stable cardiomegaly with mild to moderate severity pulmonary vascular congestion and pulmonary edema. 3. Mild to moderate severity bibasilar atelectasis and/or infiltrate. Electronically Signed   By: Suzen Dials M.D.   On: 10/30/2023 12:30   DG Chest Port 1 View Result Date: 10/29/2023 CLINICAL DATA:  Congestive heart failure. EXAM: PORTABLE CHEST 1 VIEW COMPARISON:  February 09, 2023. FINDINGS: Mild cardiomegaly is noted with mild central pulmonary vascular congestion. Bibasilar pulmonary edema is noted. Bony thorax is unremarkable. IMPRESSION: Mild cardiomegaly with mild central pulmonary vascular congestion and bilateral pulmonary edema. Electronically Signed   By: Lynwood Landy Raddle M.D.   On: 10/29/2023 16:30   CT HEAD WO CONTRAST ( ) Result Date: 10/28/2023 CLINICAL DATA:  Recent syncopal episode EXAM: CT HEAD WITHOUT CONTRAST TECHNIQUE: Contiguous axial images were obtained from the base of the skull through the vertex without intravenous contrast. RADIATION DOSE REDUCTION: This exam was performed according to the departmental dose-optimization program which includes automated exposure control, adjustment of the mA and/or kV according to patient size and/or use of iterative reconstruction technique. COMPARISON:  02/09/2023 FINDINGS: Brain: No evidence of acute infarction, hemorrhage, hydrocephalus, extra-axial collection or mass lesion/mass effect. Remote right frontoparietal infarct is again seen and stable. Vascular calcifications are noted in the centrum semi ovale stable from the prior exam. Basal ganglia calcifications are seen as well. Vascular: No hyperdense vessel or unexpected calcification. Skull: Normal. Negative for fracture or focal lesion. Sinuses/Orbits: No acute finding. Other: None. IMPRESSION: Chronic right frontoparietal infarct.  No acute abnormality noted. Electronically Signed   By: Oneil Devonshire M.D.   On: 10/28/2023 19:47    ECHO EF <  20%  TELEMETRY reviewed by me 11/05/2023: Atrial fibrillation, rate 90-100s  EKG reviewed by me: Atrial fibrillation RVR rate 131 bpm  Data reviewed by me 11/05/2023: last 24h vitals tele labs imaging I/O  hospitalist progress notes.  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 male  with a past medical history of permanent atrial fibrillation (on Eliquis ), chronic HFrEF (EF < 20%), nonischemic cardiomyopathy, pulmonary hypertension, hyperlipidemia, hypertension history CVA, diabetes mellitus, OSA (on CPAP), obesity who presented to the ED on 10/28/2023 sent by pulmonology due to extremely elevated blood glucose and hyperkalemia.  Upon admission EKG revealed atrial fibrillation RVR with rates in the 140s. Cardiology was consulted for further evaluation.   # Atrial fibrillation RVR # Permanent atrial fibrillation Patient denies chest pain, palpitations. EKG in ED with atrial fibrillation rate 131 bpm. Patient started on IV amio infusion. Patient takes PO amio 200 mg at home. Per tele in AF with improving HR in 90-100s -Monitor and replenish electrolytes for a goal K >4, Mag >2  -Continue PO Amio BID. Hoping rate will further improve now off dobutamine . -Continue Eliquis  5 mg twice daily for stroke risk reduction. -Holding Coreg  as stated below.  # Cardiogenic shock # Acute on chronic HFrEF # Nonischemic cardiomyopathy # Medication  noncompliance Patient with stated noncompliance to medication.  Patient states he does not take his cardiac medications as prescribed at home because he believes the medications do not help him.  Presents with worsening SOB, orthopnea and LEE. CXR with mild cardiomegaly and pulmonary vascular congestion and bilateral pulmonary edema. BNP elevated at 990. Poor UOP yesterday (06/24) s/p IV lasix  80 mg. Patient confused, cold, hypotensive this AM (06/25), transferred to ICU and started on levo and dobutamine  infusion due to low perfusion/low CO, cardiogenic shock. Lactic acid 1.2 (06/25). POCUS echo revealed EF around 10%. On 06/26 weaned off levo gtt around 1600 on 06/25 and continues to be on dobutamine . BP remains stable off of dobutamine . -Advanced heart failure consulted, appreciate recommendations.  -Plan to start PO torsemide 20 mg daily tomorrow.  -Per ADHF team started spiro 12.5 mg and losartan  25 mg daily (d/c Entresto  due to cost). -Continue to hold Coreg  for now per heart failure team.  -Hold off on starting dapagliflozin  due to uncontrolled A1C of 12.8% ([place] $ 38.08) -Plan to resume and optimize GDMT when patient off inotrope and hemodynamically stable.  -Per ADHF team with biventricular dysfunction with LVEF < 20%, not VAD candidate. Considering home hospice at discharge. -Per ADHF team ok to discharge from HF standpoint. Plan for home with hospice at discharge. DNR/DNI.  # Hypertension # Hyperlipidemia Patient hypotensive, started on dobutamine  and levo infusion. Patient weaned off levo and dobutamine  with stable BP. -Continue rosuvastatin  5 mg daily, fenofibrate  160 mg daily.   This patient's plan of care was discussed and created with Dr. Wilburn and he is in agreement.  Signed: Dorene Comfort, PA-C  11/05/2023, 10:42 AM [facility]

## 2023-11-05 NOTE — Plan of Care (Signed)
  Problem: Coping: Goal: Ability to adjust to condition or change in health will improve Outcome: Progressing   Problem: Fluid Volume: Goal: Ability to maintain a balanced intake and output will improve Outcome: Progressing   Problem: Health Behavior/Discharge Planning: Goal: Ability to identify and utilize available resources and services will improve Outcome: Progressing Goal: Ability to manage health-related needs will improve Outcome: Progressing   Problem: Metabolic: Goal: Ability to maintain appropriate glucose levels will improve Outcome: Not Progressing   Problem: Clinical Measurements: Goal: Ability to maintain clinical measurements within normal limits will improve Outcome: Progressing Goal: Will remain free from infection Outcome: Progressing Goal: Diagnostic test results will improve Outcome: Progressing Goal: Respiratory complications will improve Outcome: Progressing Goal: Cardiovascular complication will be avoided Outcome: Progressing

## 2023-11-05 NOTE — Progress Notes (Signed)
 PHARMACY CONSULT NOTE - FOLLOW UP  Pharmacy Consult for Electrolyte Monitoring and Replacement   Recent Labs: Potassium (mmol/L)  Date Value  11/05/2023 3.6  04/17/2012 3.8   Magnesium  (mg/dL)  Date Value  92/98/7974 2.1   Calcium  (mg/dL)  Date Value  92/98/7974 8.2 (L)   Calcium , Total (mg/dL)  Date Value  87/87/7986 9.0   Albumin (g/dL)  Date Value  93/69/7974 3.1 (L)  04/17/2012 4.1   Phosphorus (mg/dL)  Date Value  92/98/7974 2.4 (L)   Sodium (mmol/L)  Date Value  11/05/2023 136  04/17/2012 139     Assessment: 78 y.o. 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 Upon admission EKG revealed atrial fibrillation RVR with rates in the 140s. Pharmacy is asked to follow and replace electrolytes while in CCU  On amio gtt. > amio PO.  Scr trending up.   Diuretics: furosemide  160 mg IV BID  Goal of Therapy:  Potassium 4.0 - 5.1 mmol/L Magnesium  2.0 - 2.4 mg/dL All Other Electrolytes WNL  Plan:  KCL 40 mEq daily while on IV lasix  Kphos 500mg  q4h x 2 F/u with AM labs.   Paul Goodman A Kaleel Schmieder ,PharmD Clinical Pharmacist 11/05/2023 8:04 AM

## 2023-11-05 NOTE — TOC Transition Note (Signed)
 Transition of Care [facility]) - Discharge Note   Patient Details  Name: Paul Goodman MRN: [id number] Date of Birth: 1946/02/02  [facility] CM/SW Contact:  Stesha Neyens A Tarrence Enck, RN Phone Number: 11/05/2023, 3:24 PM   Clinical Narrative:    Chart reviewed.  Noted that patient has orders for discharge today. I have informed Lonell with [facility] that patient would be a discharge for today.    I have informed patient and Mrs. Hernan that [facility] will follow patient on discharge.  Mrs. Nunziata reports that she will transport patient home today.    I have made staff nurse aware.   Final next level of care: Home w Hospice Care Barriers to Discharge: No Barriers Identified   Patient Goals and CMS Choice   CMS Medicare.gov Compare Post Acute Care list provided to:: Patient Choice offered to / list presented to : Patient      Discharge Placement                  Name of family member notified: Mrs. Podolsky Patient and family notified of of transfer: 11/05/23  Discharge Plan and Services Additional resources added to the After Visit Summary for                  DME Arranged:  (No DME needs at this time.  [facility] will provide continual DME assessment on discharge.)                    Social Drivers of Health (SDOH) Interventions SDOH Screenings   Food Insecurity: Food Insecurity Present (10/28/2023)  Housing: High Risk (10/28/2023)  Transportation Needs: No Transportation Needs (10/28/2023)  Utilities: Not At Risk (10/28/2023)  Financial Resource Strain: Medium Risk (09/17/2023)   Received from [facility]  Social Connections: Socially Integrated (10/28/2023)  Tobacco Use: Medium Risk (10/28/2023)     Readmission Risk Interventions     No data to display

## 2023-11-05 NOTE — Progress Notes (Signed)
 [facility] Liaison Note  Patient decided that he did not want any additional DME in the home.  Hospice referral's team notified of possible dc home today.    Please call with any hospice related questions or concerns.  Thank you for the opportunity to participate in this paitent's care.  [facility] Liaison [phone]

## 2023-11-06 LAB — COOXEMETRY PANEL
Carboxyhemoglobin: 1.5 % (ref 0.5–1.5)
Methemoglobin: 0.6 % (ref 0.0–1.5)
O2 Saturation: 61.7 %
Total hemoglobin: 15.9 g/dL (ref 12.0–16.0)
Total oxygen content: 60.4 %

## 2023-11-11 ENCOUNTER — Encounter: Admitting: Cardiology

## 2024-04-16 ENCOUNTER — Other Ambulatory Visit: Payer: Self-pay

## 2024-04-16 ENCOUNTER — Emergency Department
Admission: EM | Admit: 2024-04-16 | Discharge: 2024-04-16 | Disposition: A | Discharge status: Home / Self Care | Attending: Emergency Medicine | Admitting: Emergency Medicine

## 2024-04-16 ENCOUNTER — Emergency Department

## 2024-04-16 DIAGNOSIS — N189 Chronic kidney disease, unspecified: Secondary | ICD-10-CM | POA: Diagnosis not present

## 2024-04-16 DIAGNOSIS — I4811 Longstanding persistent atrial fibrillation: Secondary | ICD-10-CM | POA: Diagnosis not present

## 2024-04-16 DIAGNOSIS — R42 Dizziness and giddiness: Secondary | ICD-10-CM

## 2024-04-16 DIAGNOSIS — J449 Chronic obstructive pulmonary disease, unspecified: Secondary | ICD-10-CM | POA: Diagnosis not present

## 2024-04-16 DIAGNOSIS — I5084 End stage heart failure: Secondary | ICD-10-CM

## 2024-04-16 DIAGNOSIS — I509 Heart failure, unspecified: Secondary | ICD-10-CM | POA: Insufficient documentation

## 2024-04-16 LAB — COMPREHENSIVE METABOLIC PANEL WITH GFR
ALT: 31 U/L (ref 0–44)
AST: 23 U/L (ref 15–41)
Albumin: 3.6 g/dL (ref 3.5–5.0)
Alkaline Phosphatase: 134 U/L — ABNORMAL HIGH (ref 38–126)
Anion gap: 10 (ref 5–15)
BUN: 28 mg/dL — ABNORMAL HIGH (ref 8–23)
CO2: 23 mmol/L (ref 22–32)
Calcium: 8.7 mg/dL — ABNORMAL LOW (ref 8.9–10.3)
Chloride: 101 mmol/L (ref 98–111)
Creatinine, Ser: 1.08 mg/dL (ref 0.61–1.24)
GFR, Estimated: >60 mL/min (ref >60)
Glucose, Bld: 211 mg/dL — ABNORMAL HIGH (ref 70–99)
Potassium: 4.8 mmol/L (ref 3.5–5.1)
Sodium: 134 mmol/L — ABNORMAL LOW (ref 135–145)
Total Bilirubin: 0.5 mg/dL (ref 0.0–1.2)
Total Protein: 6.4 g/dL — ABNORMAL LOW (ref 6.5–8.1)

## 2024-04-16 LAB — CBC
HCT: 38.1 % — ABNORMAL LOW (ref 39.0–52.0)
Hemoglobin: 12.7 g/dL — ABNORMAL LOW (ref 13.0–17.0)
MCH: 28.7 pg (ref 26.0–34.0)
MCHC: 33.3 g/dL (ref 30.0–36.0)
MCV: 86 fL (ref 80.0–100.0)
Platelets: 205 K/uL (ref 150–400)
RBC: 4.43 MIL/uL (ref 4.22–5.81)
RDW: 13.5 % (ref 11.5–15.5)
WBC: 6.7 K/uL (ref 4.0–10.5)
nRBC: 0 % (ref 0.0–0.2)

## 2024-04-16 LAB — TROPONIN T, HIGH SENSITIVITY
Troponin T High Sensitivity: 28 ng/L — ABNORMAL HIGH (ref 0–19)
Troponin T High Sensitivity: 25 ng/L — ABNORMAL HIGH (ref 0–19)

## 2024-04-16 LAB — PRO BRAIN NATRIURETIC PEPTIDE: Pro Brain Natriuretic Peptide: 6593 pg/mL — ABNORMAL HIGH (ref <300.0)

## 2024-04-16 LAB — LIPASE, BLOOD: Lipase: 12 U/L (ref 11–51)

## 2024-04-16 MED ORDER — SODIUM CHLORIDE 0.9 % IV BOLUS: 500.0000 mL | Freq: Once | INTRAVENOUS | Status: DC

## 2024-04-16 NOTE — ED Triage Notes (Signed)
 Pt reports aprox 3 days ago he began to have a headache dizziness numbness in his right hand and generalized weakness. Pt reports he is on hospice care for CHF. Pt reports he has had some n/v today also.

## 2024-04-16 NOTE — ED Provider Notes (Signed)
 [facility] Provider Note   Event Date/Time   First MD Initiated Contact with Patient 04/16/24 2045     (approximate) History  Dizziness  HPI Paul Goodman is a 78 y.o. male with end-stage CHF on hospice, COPD, chronic atrial fibrillation, and CKD who presents complaining of dizziness, palpitations, and PND.  Patient states that he has also been having episodes of a choking sensation that is resulted in vomiting.  Patient states that he has an oxygen concentrator that he normally uses at night and is supposed to use a CPAP machine that he currently is not using.  Patient states that he has been having the symptoms for months however they have gotten mildly worse over the last 2 days and acutely worse after this vomiting ROS: Patient currently denies any vision changes, tinnitus, difficulty speaking, facial droop, sore throat, abdominal pain, nausea/vomiting/diarrhea, dysuria, or weakness/numbness/paresthesias in any extremity   Physical Exam  Triage Vital Signs: ED Triage Vitals  Encounter Vitals Group     BP 04/16/24 1910 103/69     Girls Systolic BP Percentile --      Girls Diastolic BP Percentile --      Boys Systolic BP Percentile --      Boys Diastolic BP Percentile --      Pulse Rate 04/16/24 1910 (!) 125     Resp 04/16/24 1910 18     Temp 04/16/24 1911 97.8 F (36.6 C)     Temp Source 04/16/24 1911 Oral     SpO2 04/16/24 1910 97 %     Weight 04/16/24 1909 233 lb (105.7 kg)     Height 04/16/24 1909 6' 2 (1.88 m)     Head Circumference --      Peak Flow --      Pain Score 04/16/24 1909 10     Pain Loc --      Pain Education --      Exclude from Growth Chart --    Most recent vital signs: Vitals:   04/16/24 2053 04/16/24 2307  BP: (!) 100/50 111/88  Pulse: (!) 124 (!) 128  Resp: 20 (!) 26  Temp: 97.6 F (36.4 C) 98.4 F (36.9 C)  SpO2: 96% 92%   General: Awake, oriented x4. CV:  Good peripheral perfusion. Resp:  Normal  effort. Abd:  No distention. Other:  Elderly, overweight, Caucasian male resting comfortably in no acute distress ED Results / Procedures / Treatments  Labs (all labs ordered are listed, but only abnormal results are displayed) Labs Reviewed  CBC - Abnormal; Notable for the following components:      Result Value   Hemoglobin 12.7 (*)    HCT 38.1 (*)    All other components within normal limits  COMPREHENSIVE METABOLIC PANEL WITH GFR - Abnormal; Notable for the following components:   Sodium 134 (*)    Glucose, Bld 211 (*)    BUN 28 (*)    Calcium  8.7 (*)    Total Protein 6.4 (*)    Alkaline Phosphatase 134 (*)    All other components within normal limits  PRO BRAIN NATRIURETIC PEPTIDE - Abnormal; Notable for the following components:   Pro Brain Natriuretic Peptide 6,593.0 (*)    All other components within normal limits  TROPONIN T, HIGH SENSITIVITY - Abnormal; Notable for the following components:   Troponin T High Sensitivity 28 (*)    All other components within normal limits  TROPONIN T, HIGH SENSITIVITY - Abnormal; Notable for  the following components:   Troponin T High Sensitivity 25 (*)    All other components within normal limits  LIPASE, BLOOD  URINALYSIS, ROUTINE W REFLEX MICROSCOPIC   EKG ED ECG REPORT I, Artist MARLA Kerns, the attending physician, personally viewed and interpreted this ECG. Date: 04/16/2024 EKG Time: 1912 Rate: 125 Rhythm: Atrial fibrillation with rapid ventricular response QRS Axis: normal Intervals: normal ST/T Wave abnormalities: normal Narrative Interpretation: Atrial fibrillation with rapid ventricular response.  No evidence of acute ischemia RADIOLOGY ED MD interpretation: 2 view chest x-ray interpreted by me shows no evidence of acute abnormalities including no pneumonia, pneumothorax, or widened mediastinum CT of the head without contrast interpreted by me shows no evidence of acute abnormalities including no intracerebral hemorrhage,  obvious masses, or significant edema - All radiology independently interpreted and agree with radiology assessment Official radiology report(s): DG Chest 2 View Result Date: 04/16/2024 CLINICAL DATA:  Headache, dizziness, right hand numbness, chest pain EXAM: CHEST - 2 VIEW COMPARISON:  10/30/2023 FINDINGS: Frontal and lateral views of the chest demonstrates stable enlargement of the cardiac silhouette. No acute airspace disease, effusion, or pneumothorax. No acute bony abnormalities. IMPRESSION: 1. No acute intrathoracic process. Electronically Signed   By: Ozell Daring M.D.   On: 04/16/2024 19:36   CT Head Wo Contrast Result Date: 04/16/2024 CLINICAL DATA:  Dizziness, weakness, headache EXAM: CT HEAD WITHOUT CONTRAST TECHNIQUE: Contiguous axial images were obtained from the base of the skull through the vertex without intravenous contrast. RADIATION DOSE REDUCTION: This exam was performed according to the departmental dose-optimization program which includes automated exposure control, adjustment of the mA and/or kV according to patient size and/or use of iterative reconstruction technique. 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 No evidence of acute infarct or hemorrhage. Stable benign calcifications within the cerebellum, bilateral periventricular white matter, bilateral basal ganglia. Lateral ventricles and midline structures are otherwise unremarkable. No acute extra-axial fluid collections. No mass effect. Vascular: Stable atherosclerosis.  No hyperdense vessel. Skull: Normal. Negative for fracture or focal lesion. Sinuses/Orbits: No acute finding. Other: None. IMPRESSION: 1. Stable head CT, no acute intracranial process. Electronically Signed   By: Ozell Daring M.D.   On: 04/16/2024 19:35   PROCEDURES: Critical Care performed: No Procedures MEDICATIONS ORDERED IN ED: Medications  sodium  chloride 0.9 % bolus 500 mL (has no administration in time range)   IMPRESSION / MDM / ASSESSMENT AND PLAN / ED COURSE  I reviewed the triage vital signs and the nursing notes.                             The patient is on the cardiac monitor to evaluate for evidence of arrhythmia and/or significant heart rate changes. Patient's presentation is most consistent with acute presentation with potential threat to life or bodily function. Patient is a 78 year old male with the above-stated past medical history who presents complaining of acute on chronic shortness of breath with dyspnea on exertion and PND. DDx: CHF exacerbation, A-fib with RVR, dehydration, pulmonary edema Plan: CBC, CMP, lipase, troponin, BNP, chest x-ray, head CT  Laboratory and radiologic evaluation does not show any evidence of acute abnormalities.  Patient has chronic elevated troponin and BNP given his history of CHF.  Patient's A-fib with RVR significantly improved after 500 cc of normal saline and patient was encouraged to continue hydration at home as well as follow-up with his  cardiologist, Dr. Florencio.  Patient given strict return precautions and all questions answered prior to discharge  Dispo: Discharge home with cardiology follow-up   FINAL CLINICAL IMPRESSION(S) / ED DIAGNOSES   Final diagnoses:  Lightheadedness  Longstanding persistent atrial fibrillation (HCC)  End stage heart failure (HCC)   Rx / DC Orders   ED Discharge Orders     None      Note:  This document was prepared using Dragon voice recognition software and may include unintentional dictation errors.   Maria Gallicchio K, MD 04/16/24 [phone]

## 2024-06-07 DEATH — deceased
